# Patient Record
Sex: Female | Born: 1955 | Race: Black or African American | Hispanic: No | Smoking: Never smoker
Health system: Southern US, Community
[De-identification: ages and names within clinical notes are randomized; demographics above are authoritative.]

## PROBLEM LIST (undated history)

## (undated) DIAGNOSIS — I2510 Atherosclerotic heart disease of native coronary artery without angina pectoris: Secondary | ICD-10-CM

## (undated) DIAGNOSIS — E785 Hyperlipidemia, unspecified: Secondary | ICD-10-CM

## (undated) DIAGNOSIS — I10 Essential (primary) hypertension: Secondary | ICD-10-CM

## (undated) DIAGNOSIS — K649 Unspecified hemorrhoids: Secondary | ICD-10-CM

## (undated) DIAGNOSIS — E119 Type 2 diabetes mellitus without complications: Secondary | ICD-10-CM

## (undated) DIAGNOSIS — R600 Localized edema: Secondary | ICD-10-CM

## (undated) DIAGNOSIS — Z531 Procedure and treatment not carried out because of patient's decision for reasons of belief and group pressure: Secondary | ICD-10-CM

## (undated) DIAGNOSIS — K219 Gastro-esophageal reflux disease without esophagitis: Secondary | ICD-10-CM

## (undated) DIAGNOSIS — K589 Irritable bowel syndrome without diarrhea: Secondary | ICD-10-CM

## (undated) DIAGNOSIS — IMO0001 Reserved for inherently not codable concepts without codable children: Secondary | ICD-10-CM

## (undated) DIAGNOSIS — E669 Obesity, unspecified: Secondary | ICD-10-CM

## (undated) HISTORY — DX: Hyperlipidemia, unspecified: E78.5

## (undated) HISTORY — DX: Unspecified hemorrhoids: K64.9

## (undated) HISTORY — PX: ABDOMINAL HYSTERECTOMY: SHX81

## (undated) HISTORY — DX: Obesity, unspecified: E66.9

## (undated) HISTORY — PX: CARPAL TUNNEL RELEASE: SHX101

## (undated) HISTORY — DX: Essential (primary) hypertension: I10

## (undated) HISTORY — DX: Localized edema: R60.0

---

## 1998-02-24 ENCOUNTER — Encounter: Admission: RE | Admit: 1998-02-24 | Discharge: 1998-02-24 | Payer: Self-pay | Admitting: Family Medicine

## 1998-03-31 ENCOUNTER — Encounter: Admission: RE | Admit: 1998-03-31 | Discharge: 1998-03-31 | Payer: Self-pay | Admitting: Family Medicine

## 1998-11-21 ENCOUNTER — Encounter: Admission: RE | Admit: 1998-11-21 | Discharge: 1998-11-21 | Payer: Self-pay | Admitting: Family Medicine

## 1998-12-26 ENCOUNTER — Other Ambulatory Visit: Admission: RE | Admit: 1998-12-26 | Discharge: 1998-12-26 | Payer: Self-pay | Admitting: *Deleted

## 1998-12-26 ENCOUNTER — Encounter: Admission: RE | Admit: 1998-12-26 | Discharge: 1998-12-26 | Payer: Self-pay | Admitting: Family Medicine

## 1999-06-29 ENCOUNTER — Encounter: Admission: RE | Admit: 1999-06-29 | Discharge: 1999-06-29 | Payer: Self-pay | Admitting: Family Medicine

## 1999-08-01 ENCOUNTER — Encounter: Admission: RE | Admit: 1999-08-01 | Discharge: 1999-08-01 | Payer: Self-pay | Admitting: Family Medicine

## 1999-10-03 ENCOUNTER — Encounter: Admission: RE | Admit: 1999-10-03 | Discharge: 1999-10-03 | Payer: Self-pay | Admitting: Family Medicine

## 1999-10-24 ENCOUNTER — Encounter: Admission: RE | Admit: 1999-10-24 | Discharge: 1999-10-24 | Payer: Self-pay | Admitting: Family Medicine

## 1999-12-06 ENCOUNTER — Encounter: Admission: RE | Admit: 1999-12-06 | Discharge: 1999-12-06 | Payer: Self-pay | Admitting: Family Medicine

## 1999-12-20 ENCOUNTER — Encounter: Admission: RE | Admit: 1999-12-20 | Discharge: 1999-12-20 | Payer: Self-pay | Admitting: Family Medicine

## 2000-01-01 ENCOUNTER — Encounter: Admission: RE | Admit: 2000-01-01 | Discharge: 2000-01-01 | Payer: Self-pay | Admitting: Sports Medicine

## 2000-01-03 ENCOUNTER — Encounter: Admission: RE | Admit: 2000-01-03 | Discharge: 2000-01-03 | Payer: Self-pay | Admitting: Family Medicine

## 2000-01-08 ENCOUNTER — Encounter: Admission: RE | Admit: 2000-01-08 | Discharge: 2000-01-08 | Payer: Self-pay | Admitting: Family Medicine

## 2000-01-30 ENCOUNTER — Encounter: Admission: RE | Admit: 2000-01-30 | Discharge: 2000-01-30 | Payer: Self-pay | Admitting: Family Medicine

## 2000-02-20 ENCOUNTER — Encounter: Admission: RE | Admit: 2000-02-20 | Discharge: 2000-02-20 | Payer: Self-pay | Admitting: Family Medicine

## 2000-02-27 ENCOUNTER — Encounter: Admission: RE | Admit: 2000-02-27 | Discharge: 2000-02-27 | Payer: Self-pay | Admitting: Family Medicine

## 2000-03-19 ENCOUNTER — Encounter: Admission: RE | Admit: 2000-03-19 | Discharge: 2000-06-17 | Payer: Self-pay | Admitting: *Deleted

## 2000-07-03 ENCOUNTER — Encounter: Admission: RE | Admit: 2000-07-03 | Discharge: 2000-07-03 | Payer: Self-pay | Admitting: *Deleted

## 2000-08-11 ENCOUNTER — Encounter: Admission: RE | Admit: 2000-08-11 | Discharge: 2000-08-11 | Payer: Self-pay | Admitting: Family Medicine

## 2000-08-20 ENCOUNTER — Encounter: Admission: RE | Admit: 2000-08-20 | Discharge: 2000-08-20 | Payer: Self-pay | Admitting: Family Medicine

## 2000-09-01 ENCOUNTER — Encounter: Admission: RE | Admit: 2000-09-01 | Discharge: 2000-09-01 | Payer: Self-pay | Admitting: Family Medicine

## 2000-09-17 ENCOUNTER — Encounter: Admission: RE | Admit: 2000-09-17 | Discharge: 2000-09-17 | Payer: Self-pay | Admitting: Family Medicine

## 2000-11-13 ENCOUNTER — Encounter: Admission: RE | Admit: 2000-11-13 | Discharge: 2000-11-13 | Payer: Self-pay | Admitting: Family Medicine

## 2000-11-17 ENCOUNTER — Encounter: Admission: RE | Admit: 2000-11-17 | Discharge: 2000-11-17 | Payer: Self-pay | Admitting: Family Medicine

## 2000-11-24 ENCOUNTER — Encounter: Admission: RE | Admit: 2000-11-24 | Discharge: 2001-02-22 | Payer: Self-pay | Admitting: *Deleted

## 2000-11-28 ENCOUNTER — Encounter: Admission: RE | Admit: 2000-11-28 | Discharge: 2000-11-28 | Payer: Self-pay | Admitting: Family Medicine

## 2001-01-27 ENCOUNTER — Encounter: Admission: RE | Admit: 2001-01-27 | Discharge: 2001-01-27 | Payer: Self-pay | Admitting: Sports Medicine

## 2001-02-03 ENCOUNTER — Encounter: Admission: RE | Admit: 2001-02-03 | Discharge: 2001-02-03 | Payer: Self-pay | Admitting: Family Medicine

## 2001-02-09 ENCOUNTER — Encounter: Admission: RE | Admit: 2001-02-09 | Discharge: 2001-02-09 | Payer: Self-pay | Admitting: Family Medicine

## 2001-07-06 ENCOUNTER — Encounter: Admission: RE | Admit: 2001-07-06 | Discharge: 2001-07-06 | Payer: Self-pay | Admitting: Sports Medicine

## 2001-07-06 ENCOUNTER — Encounter: Payer: Self-pay | Admitting: Sports Medicine

## 2001-08-07 ENCOUNTER — Encounter: Admission: RE | Admit: 2001-08-07 | Discharge: 2001-08-07 | Payer: Self-pay | Admitting: Family Medicine

## 2001-11-06 ENCOUNTER — Encounter: Admission: RE | Admit: 2001-11-06 | Discharge: 2001-11-06 | Payer: Self-pay | Admitting: Family Medicine

## 2001-12-28 ENCOUNTER — Encounter: Admission: RE | Admit: 2001-12-28 | Discharge: 2001-12-28 | Payer: Self-pay | Admitting: Family Medicine

## 2002-04-29 ENCOUNTER — Encounter: Admission: RE | Admit: 2002-04-29 | Discharge: 2002-04-29 | Payer: Self-pay | Admitting: Family Medicine

## 2002-06-21 ENCOUNTER — Encounter: Admission: RE | Admit: 2002-06-21 | Discharge: 2002-06-21 | Payer: Self-pay | Admitting: Family Medicine

## 2002-07-30 ENCOUNTER — Encounter: Payer: Self-pay | Admitting: Sports Medicine

## 2002-07-30 ENCOUNTER — Encounter: Admission: RE | Admit: 2002-07-30 | Discharge: 2002-07-30 | Payer: Self-pay | Admitting: Sports Medicine

## 2002-08-05 ENCOUNTER — Encounter: Admission: RE | Admit: 2002-08-05 | Discharge: 2002-08-05 | Payer: Self-pay | Admitting: Family Medicine

## 2002-09-21 ENCOUNTER — Encounter: Admission: RE | Admit: 2002-09-21 | Discharge: 2002-09-21 | Payer: Self-pay | Admitting: Sports Medicine

## 2003-02-28 ENCOUNTER — Encounter: Admission: RE | Admit: 2003-02-28 | Discharge: 2003-02-28 | Payer: Self-pay | Admitting: Family Medicine

## 2003-03-07 ENCOUNTER — Encounter: Admission: RE | Admit: 2003-03-07 | Discharge: 2003-03-07 | Payer: Self-pay | Admitting: Sports Medicine

## 2003-03-21 ENCOUNTER — Encounter: Admission: RE | Admit: 2003-03-21 | Discharge: 2003-03-21 | Payer: Self-pay | Admitting: Family Medicine

## 2003-03-25 ENCOUNTER — Encounter: Admission: RE | Admit: 2003-03-25 | Discharge: 2003-03-25 | Payer: Self-pay | Admitting: Family Medicine

## 2003-09-29 ENCOUNTER — Encounter: Admission: RE | Admit: 2003-09-29 | Discharge: 2003-09-29 | Payer: Self-pay | Admitting: Sports Medicine

## 2003-12-30 ENCOUNTER — Encounter (INDEPENDENT_AMBULATORY_CARE_PROVIDER_SITE_OTHER): Payer: Self-pay | Admitting: *Deleted

## 2003-12-30 LAB — CONVERTED CEMR LAB

## 2004-01-17 ENCOUNTER — Encounter: Admission: RE | Admit: 2004-01-17 | Discharge: 2004-01-17 | Payer: Self-pay | Admitting: Family Medicine

## 2004-06-22 ENCOUNTER — Ambulatory Visit: Payer: Self-pay | Admitting: Family Medicine

## 2004-07-04 ENCOUNTER — Ambulatory Visit: Payer: Self-pay | Admitting: Family Medicine

## 2004-07-12 ENCOUNTER — Ambulatory Visit: Payer: Self-pay | Admitting: Family Medicine

## 2004-10-17 ENCOUNTER — Ambulatory Visit: Payer: Self-pay | Admitting: Family Medicine

## 2004-10-19 ENCOUNTER — Encounter: Admission: RE | Admit: 2004-10-19 | Discharge: 2004-10-19 | Payer: Self-pay | Admitting: Sports Medicine

## 2004-10-23 ENCOUNTER — Other Ambulatory Visit: Admission: RE | Admit: 2004-10-23 | Discharge: 2004-10-23 | Payer: Self-pay | Admitting: Obstetrics and Gynecology

## 2004-12-27 ENCOUNTER — Ambulatory Visit: Payer: Self-pay | Admitting: Family Medicine

## 2005-01-17 ENCOUNTER — Ambulatory Visit: Payer: Self-pay | Admitting: Family Medicine

## 2005-03-08 ENCOUNTER — Ambulatory Visit (HOSPITAL_COMMUNITY): Admission: RE | Admit: 2005-03-08 | Discharge: 2005-03-08 | Payer: Self-pay | Admitting: Obstetrics and Gynecology

## 2005-10-23 ENCOUNTER — Encounter: Admission: RE | Admit: 2005-10-23 | Discharge: 2005-10-23 | Payer: Self-pay | Admitting: Sports Medicine

## 2006-03-14 ENCOUNTER — Ambulatory Visit: Payer: Self-pay | Admitting: Family Medicine

## 2006-03-19 ENCOUNTER — Ambulatory Visit: Payer: Self-pay | Admitting: Family Medicine

## 2006-05-29 ENCOUNTER — Other Ambulatory Visit: Admission: RE | Admit: 2006-05-29 | Discharge: 2006-05-29 | Payer: Self-pay | Admitting: Obstetrics and Gynecology

## 2006-07-14 ENCOUNTER — Encounter: Admission: RE | Admit: 2006-07-14 | Discharge: 2006-07-14 | Payer: Self-pay | Admitting: Emergency Medicine

## 2006-07-24 ENCOUNTER — Encounter: Admission: RE | Admit: 2006-07-24 | Discharge: 2006-07-24 | Payer: Self-pay | Admitting: Emergency Medicine

## 2006-09-04 ENCOUNTER — Encounter: Admission: RE | Admit: 2006-09-04 | Discharge: 2006-09-04 | Payer: Self-pay | Admitting: Emergency Medicine

## 2006-09-30 HISTORY — PX: BREAST EXCISIONAL BIOPSY: SUR124

## 2006-11-28 ENCOUNTER — Encounter (INDEPENDENT_AMBULATORY_CARE_PROVIDER_SITE_OTHER): Payer: Self-pay | Admitting: *Deleted

## 2007-01-27 ENCOUNTER — Encounter: Payer: Self-pay | Admitting: *Deleted

## 2007-01-27 ENCOUNTER — Ambulatory Visit: Payer: Self-pay | Admitting: Family Medicine

## 2007-01-27 ENCOUNTER — Encounter: Payer: Self-pay | Admitting: Family Medicine

## 2007-01-27 DIAGNOSIS — E7800 Pure hypercholesterolemia, unspecified: Secondary | ICD-10-CM | POA: Insufficient documentation

## 2007-01-27 DIAGNOSIS — R609 Edema, unspecified: Secondary | ICD-10-CM | POA: Insufficient documentation

## 2007-01-27 DIAGNOSIS — E119 Type 2 diabetes mellitus without complications: Secondary | ICD-10-CM

## 2007-01-27 DIAGNOSIS — I10 Essential (primary) hypertension: Secondary | ICD-10-CM | POA: Insufficient documentation

## 2007-01-27 LAB — CONVERTED CEMR LAB: Hgb A1c MFr Bld: 5.6 %

## 2007-01-30 ENCOUNTER — Encounter (INDEPENDENT_AMBULATORY_CARE_PROVIDER_SITE_OTHER): Payer: Self-pay | Admitting: *Deleted

## 2007-02-04 ENCOUNTER — Ambulatory Visit: Payer: Self-pay | Admitting: Family Medicine

## 2007-02-04 ENCOUNTER — Ambulatory Visit (HOSPITAL_COMMUNITY): Admission: RE | Admit: 2007-02-04 | Discharge: 2007-02-04 | Payer: Self-pay | Admitting: Family Medicine

## 2007-02-04 ENCOUNTER — Ambulatory Visit: Payer: Self-pay | Admitting: Cardiology

## 2007-02-04 ENCOUNTER — Encounter: Payer: Self-pay | Admitting: Cardiology

## 2007-02-04 ENCOUNTER — Encounter (INDEPENDENT_AMBULATORY_CARE_PROVIDER_SITE_OTHER): Payer: Self-pay | Admitting: *Deleted

## 2007-02-05 ENCOUNTER — Encounter: Payer: Self-pay | Admitting: Family Medicine

## 2007-02-05 LAB — CONVERTED CEMR LAB
Calcium: 9.2 mg/dL (ref 8.4–10.5)
Chloride: 105 meq/L (ref 96–112)
Creatinine, Ser: 0.93 mg/dL (ref 0.40–1.20)
HDL: 58 mg/dL (ref >39)
LDL Cholesterol: 170 mg/dL — ABNORMAL HIGH (ref 0–99)
Sodium: 140 meq/L (ref 135–145)
Total CHOL/HDL Ratio: 4.3
Triglycerides: 96 mg/dL (ref <150)

## 2007-02-13 ENCOUNTER — Telehealth: Payer: Self-pay | Admitting: Family Medicine

## 2007-03-12 ENCOUNTER — Telehealth: Payer: Self-pay | Admitting: Family Medicine

## 2007-03-12 ENCOUNTER — Encounter: Payer: Self-pay | Admitting: Family Medicine

## 2007-03-16 ENCOUNTER — Telehealth (INDEPENDENT_AMBULATORY_CARE_PROVIDER_SITE_OTHER): Payer: Self-pay | Admitting: *Deleted

## 2007-07-16 ENCOUNTER — Encounter: Admission: RE | Admit: 2007-07-16 | Discharge: 2007-07-16 | Payer: Self-pay | Admitting: Emergency Medicine

## 2007-08-19 ENCOUNTER — Encounter: Admission: RE | Admit: 2007-08-19 | Discharge: 2007-08-19 | Payer: Self-pay | Admitting: General Surgery

## 2007-08-20 ENCOUNTER — Encounter: Admission: RE | Admit: 2007-08-20 | Discharge: 2007-08-20 | Payer: Self-pay | Admitting: General Surgery

## 2007-08-20 ENCOUNTER — Ambulatory Visit (HOSPITAL_BASED_OUTPATIENT_CLINIC_OR_DEPARTMENT_OTHER): Admission: RE | Admit: 2007-08-20 | Discharge: 2007-08-20 | Payer: Self-pay | Admitting: General Surgery

## 2007-08-20 ENCOUNTER — Encounter (INDEPENDENT_AMBULATORY_CARE_PROVIDER_SITE_OTHER): Payer: Self-pay | Admitting: General Surgery

## 2007-09-30 ENCOUNTER — Encounter: Payer: Self-pay | Admitting: Family Medicine

## 2007-11-06 ENCOUNTER — Encounter: Payer: Self-pay | Admitting: Family Medicine

## 2008-01-25 ENCOUNTER — Encounter: Payer: Self-pay | Admitting: Family Medicine

## 2008-01-26 ENCOUNTER — Encounter: Payer: Self-pay | Admitting: *Deleted

## 2008-03-15 HISTORY — PX: NM MYOVIEW LTD: HXRAD82

## 2008-04-28 ENCOUNTER — Ambulatory Visit: Payer: Self-pay | Admitting: Family Medicine

## 2008-04-28 ENCOUNTER — Encounter: Payer: Self-pay | Admitting: Family Medicine

## 2008-04-28 DIAGNOSIS — M25569 Pain in unspecified knee: Secondary | ICD-10-CM

## 2008-04-28 LAB — CONVERTED CEMR LAB
Albumin: 4.2 g/dL (ref 3.5–5.2)
BUN: 9 mg/dL (ref 6–23)
CO2: 21 meq/L (ref 19–32)
Cholesterol: 209 mg/dL — ABNORMAL HIGH (ref 0–200)
Glucose, Bld: 146 mg/dL — ABNORMAL HIGH (ref 70–99)
Potassium: 3.6 meq/L (ref 3.5–5.3)
Sodium: 139 meq/L (ref 135–145)
Total Protein: 7.2 g/dL (ref 6.0–8.3)
Triglycerides: 71 mg/dL (ref <150)

## 2008-04-29 ENCOUNTER — Encounter: Payer: Self-pay | Admitting: Family Medicine

## 2008-05-12 ENCOUNTER — Ambulatory Visit: Payer: Self-pay | Admitting: Family Medicine

## 2008-07-18 ENCOUNTER — Encounter: Admission: RE | Admit: 2008-07-18 | Discharge: 2008-07-18 | Payer: Self-pay | Admitting: Emergency Medicine

## 2008-09-29 ENCOUNTER — Encounter: Admission: RE | Admit: 2008-09-29 | Discharge: 2008-09-29 | Payer: Self-pay | Admitting: Family Medicine

## 2008-12-26 ENCOUNTER — Telehealth (INDEPENDENT_AMBULATORY_CARE_PROVIDER_SITE_OTHER): Payer: Self-pay | Admitting: *Deleted

## 2008-12-30 ENCOUNTER — Encounter: Payer: Self-pay | Admitting: Family Medicine

## 2008-12-30 ENCOUNTER — Ambulatory Visit: Payer: Self-pay | Admitting: Family Medicine

## 2008-12-30 DIAGNOSIS — G5600 Carpal tunnel syndrome, unspecified upper limb: Secondary | ICD-10-CM

## 2008-12-30 LAB — CONVERTED CEMR LAB
ALT: 16 units/L (ref 0–35)
Albumin: 4.1 g/dL (ref 3.5–5.2)
CO2: 26 meq/L (ref 19–32)
Calcium: 9.4 mg/dL (ref 8.4–10.5)
Chloride: 108 meq/L (ref 96–112)
Potassium: 4.3 meq/L (ref 3.5–5.3)
Sodium: 142 meq/L (ref 135–145)
TSH: 1.088 microintl units/mL (ref 0.350–4.500)
Total Bilirubin: 0.4 mg/dL (ref 0.3–1.2)
Total Protein: 7.4 g/dL (ref 6.0–8.3)

## 2009-01-02 ENCOUNTER — Encounter: Payer: Self-pay | Admitting: Family Medicine

## 2009-01-24 ENCOUNTER — Encounter: Payer: Self-pay | Admitting: Family Medicine

## 2009-01-30 ENCOUNTER — Encounter (INDEPENDENT_AMBULATORY_CARE_PROVIDER_SITE_OTHER): Payer: Self-pay | Admitting: *Deleted

## 2009-02-10 ENCOUNTER — Ambulatory Visit: Payer: Self-pay | Admitting: Family Medicine

## 2009-02-14 ENCOUNTER — Telehealth: Payer: Self-pay | Admitting: *Deleted

## 2009-02-16 ENCOUNTER — Encounter: Payer: Self-pay | Admitting: Family Medicine

## 2009-02-16 ENCOUNTER — Encounter: Admission: RE | Admit: 2009-02-16 | Discharge: 2009-02-16 | Payer: Self-pay | Admitting: Family Medicine

## 2009-02-21 ENCOUNTER — Encounter: Payer: Self-pay | Admitting: Family Medicine

## 2009-02-21 ENCOUNTER — Ambulatory Visit: Payer: Self-pay | Admitting: Family Medicine

## 2009-02-21 LAB — CONVERTED CEMR LAB
MCHC: 29.8 g/dL — ABNORMAL LOW (ref 30.0–36.0)
MCV: 78.6 fL (ref 78.0–100.0)
RBC: 4.35 M/uL (ref 3.87–5.11)
Retic Ct Pct: 1.1 % (ref 0.4–3.1)

## 2009-02-22 ENCOUNTER — Telehealth: Payer: Self-pay | Admitting: Family Medicine

## 2009-03-10 ENCOUNTER — Encounter: Payer: Self-pay | Admitting: Family Medicine

## 2009-03-13 ENCOUNTER — Encounter: Payer: Self-pay | Admitting: Family Medicine

## 2009-03-13 ENCOUNTER — Telehealth: Payer: Self-pay | Admitting: Family Medicine

## 2009-03-23 ENCOUNTER — Encounter: Payer: Self-pay | Admitting: Family Medicine

## 2009-04-21 ENCOUNTER — Encounter: Payer: Self-pay | Admitting: Family Medicine

## 2009-05-11 ENCOUNTER — Encounter: Payer: Self-pay | Admitting: Family Medicine

## 2009-05-21 DIAGNOSIS — K210 Gastro-esophageal reflux disease with esophagitis: Secondary | ICD-10-CM

## 2009-05-31 ENCOUNTER — Encounter: Payer: Self-pay | Admitting: Family Medicine

## 2009-08-03 ENCOUNTER — Ambulatory Visit: Payer: Self-pay | Admitting: Family Medicine

## 2009-08-03 ENCOUNTER — Encounter: Payer: Self-pay | Admitting: Family Medicine

## 2009-08-03 LAB — CONVERTED CEMR LAB
BUN: 10 mg/dL (ref 6–23)
CO2: 24 meq/L (ref 19–32)
Chloride: 104 meq/L (ref 96–112)
Creatinine, Ser: 0.85 mg/dL (ref 0.40–1.20)
HCT: 38.2 % (ref 36.0–46.0)
Hemoglobin: 12 g/dL (ref 12.0–15.0)
Hgb A1c MFr Bld: 6.1 %
RDW: 14.4 % (ref 11.5–15.5)

## 2009-08-06 ENCOUNTER — Encounter: Payer: Self-pay | Admitting: Family Medicine

## 2009-08-28 ENCOUNTER — Encounter: Admission: RE | Admit: 2009-08-28 | Discharge: 2009-08-28 | Payer: Self-pay | Admitting: Family Medicine

## 2009-09-06 ENCOUNTER — Ambulatory Visit: Payer: Self-pay | Admitting: Family Medicine

## 2009-10-24 ENCOUNTER — Ambulatory Visit: Payer: Self-pay | Admitting: Family Medicine

## 2009-11-21 ENCOUNTER — Encounter: Payer: Self-pay | Admitting: Family Medicine

## 2009-11-21 ENCOUNTER — Ambulatory Visit: Payer: Self-pay | Admitting: Family Medicine

## 2009-11-22 ENCOUNTER — Telehealth: Payer: Self-pay | Admitting: Family Medicine

## 2009-11-22 ENCOUNTER — Encounter: Payer: Self-pay | Admitting: Family Medicine

## 2009-11-26 LAB — CONVERTED CEMR LAB
Alkaline Phosphatase: 70 units/L (ref 39–117)
BUN: 14 mg/dL (ref 6–23)
Glucose, Bld: 101 mg/dL — ABNORMAL HIGH (ref 70–99)
HDL: 59 mg/dL (ref >39)
LDL Cholesterol: 154 mg/dL — ABNORMAL HIGH (ref 0–99)
Sodium: 141 meq/L (ref 135–145)
Total Bilirubin: 0.5 mg/dL (ref 0.3–1.2)
Total CHOL/HDL Ratio: 3.8
Triglycerides: 69 mg/dL (ref <150)
VLDL: 14 mg/dL (ref 0–40)

## 2010-07-19 ENCOUNTER — Encounter: Payer: Self-pay | Admitting: Family Medicine

## 2010-08-29 ENCOUNTER — Encounter: Admission: RE | Admit: 2010-08-29 | Discharge: 2010-08-29 | Payer: Self-pay | Admitting: Family Medicine

## 2010-09-28 HISTORY — PX: US ECHOCARDIOGRAPHY: HXRAD669

## 2010-10-25 ENCOUNTER — Encounter: Payer: Self-pay | Admitting: Family Medicine

## 2010-10-30 NOTE — Assessment & Plan Note (Signed)
Summary: fu HTN/kh   Vital Signs:  Patient profile:   55 year old female Height:      65 inches Weight:      176.1 pounds BMI:     29.41 Temp:     98.1 degrees F oral Pulse rate:   93 / minute BP sitting:   159 / 96  (left arm) Cuff size:   regular  Vitals Entered By: Garen Grams LPN (October 24, 2009 3:12 PM) CC: f/u BP Is Patient Diabetic? Yes Did you bring your meter with you today? No Pain Assessment Patient in pain? no        Primary Care Provider:  Delbert Harness MD  CC:  f/u BP.  History of Present Illness: 55 yo here for follow-up of BP today  Last seen about 1 month ago at which time blood pressure was above goal.  Since that time patient has been working on improved complicance with medications.  Patient shows improvement today, brining in calendar I printed her filled out with home BP checks as well as check marks on days which she took her medication.  Her home Bp readings ranged from 115-151/ 79-96.  She has been taking her medications 2-4 times a week which is an improvement from rarely before.  Denies CP, SOB, has continued L>R LE edema  BP elevated today.  Has not taken BP meds for severaal days.  States its just a matter of remembering and finances or side effects not an issue.  Has not been exercising due to weather, no dietary changes but plans on soon.  Habits & Providers  Alcohol-Tobacco-Diet     Tobacco Status: never  Current Medications (verified): 1)  Aspirin Ec 81 Mg Tbec (Aspirin) .... Take 1 Tablet By Mouth Once A Day 2)  Hydrochlorothiazide 12.5 Mg Caps (Hydrochlorothiazide) .... Take 1 Capsule By Mouth Once A Day 3)  Lasix 20 Mg Tabs (Furosemide) .... Take 1 Tablet By Mouth Once A Day As Needed For Leg Swelling 4)  Lisinopril 40 Mg Tabs (Lisinopril) .... Take 1 Tablet By Mouth Once A Day 5)  Norvasc 5 Mg Tabs (Amlodipine Besylate) .... Take 1 Tablet Once A Day.  Make Appointment For Further Refills 6)  Fish Oil 1000 Mg Caps (Omega-3 Fatty  Acids) .... 3 Caps By Mouth Daily  Allergies: 1)  ! Lipitor 2)  ! * Zetia PMH-FH-SH reviewed-no changes except otherwise noted  Review of Systems      See HPI General:  Denies fever and weight loss. CV:  Complains of swelling of feet; denies chest pain or discomfort, fatigue, lightheadness, and shortness of breath with exertion. Resp:  Denies cough and shortness of breath.  Physical Exam  General:  A well nourished, well appearing aaf in NAD.  Cheerful. Lungs:  CTAB, no accessory muscle use, no retractions Heart:  RRR, no murmur, rub or gallop Extremities:  LE edema Left > Right, unchanged.   Impression & Recommendations:  Problem # 1:  HYPERTENSION (ICD-401.9)  BP today slightly improved from previous but still above goal.  Will continue to work on taking medications daily and therapeutic lifestyle changes.  Discussed with patient in depth and given handout on tips for remember HTN meds, printed out calendar for tracking, given handout on nutrition tips.  Will follow-up in 1 month.  Her updated medication list for this problem includes:    Hydrochlorothiazide 12.5 Mg Caps (Hydrochlorothiazide) .Marland Kitchen... Take 1 capsule by mouth once a day    Lasix 20 Mg  Tabs (Furosemide) .Marland Kitchen... Take 1 tablet by mouth once a day as needed for leg swelling    Lisinopril 40 Mg Tabs (Lisinopril) .Marland Kitchen... Take 1 tablet by mouth once a day    Norvasc 5 Mg Tabs (Amlodipine besylate) .Marland Kitchen... Take 1 tablet once a day.  make appointment for further refills  BP today: 159/96 Prior BP: 162/99 (09/06/2009)  Labs Reviewed: K+: 3.9 (08/03/2009) Creat: : 0.85 (08/03/2009)   Chol: 209 (04/28/2008)   HDL: 50 (04/28/2008)   LDL: 145 (04/28/2008)   TG: 71 (04/28/2008)  Orders: [facility]- Est Level  3 (99213)  Complete Medication List: 1)  Aspirin Ec 81 Mg Tbec (Aspirin) .... Take 1 tablet by mouth once a day 2)  Hydrochlorothiazide 12.5 Mg Caps (Hydrochlorothiazide) .... Take 1 capsule by mouth once a day 3)  Lasix 20 Mg  Tabs (Furosemide) .... Take 1 tablet by mouth once a day as needed for leg swelling 4)  Lisinopril 40 Mg Tabs (Lisinopril) .... Take 1 tablet by mouth once a day 5)  Norvasc 5 Mg Tabs (Amlodipine besylate) .... Take 1 tablet once a day.  make appointment for further refills 6)  Fish Oil 1000 Mg Caps (Omega-3 fatty acids) .... 3 caps by mouth daily  Patient Instructions: 1)  Work on continuuing to cut back on sweets and juices. 2)  Keep tracking your blood pressure and work on taking your medicines regularly. 3)  Follow-up in 1 month for a morning appointment for fasting labs.  Last Flu Vaccine:  given (07/31/2008 2:07:48 PM) Flu Vaccine Result Date:  09/16/2009 Flu Vaccine Result:  given Flu Vaccine Next Due:  1 yr Colonoscopy Result Date:  11/09/2007 Colonoscopy Result:  normal Colonoscopy Next Due:  10 yr Last Mammogram:  ASSESSMENT: Negative - BI-RADS 1^MM DIGITAL SCREENING (08/28/2009 4:50:00 PM) Mammogram Next Due:  1 yr

## 2010-10-30 NOTE — Consult Note (Signed)
Summary: [facility]  [facility]   Imported By: De Nurse 07/26/2010 12:14:43  _____________________________________________________________________  External Attachment:    Type:   Image     Comment:   External Document

## 2010-10-30 NOTE — Progress Notes (Signed)
Summary: Letter Needed  Phone Note Call from Patient Call back at Home Phone [phone]   Caller: Patient Summary of Call: Pt needs note stating that she has high bp and that becuase of the medication she takes causes her to make frequent trips to the bathroom. Also they need to know if she has anything that would affect her in doing her job duties.   Her employer wants this note for her file.   Initial call taken by: Clydell Hakim,  November 22, 2009 4:12 PM  Follow-up for Phone Call        to PCP Follow-up by: Gladstone Pih,  November 22, 2009 4:33 PM  Additional Follow-up for Phone Call Additional follow up Details #1::        i have saved the letter to the chart.  Please give to patient or workplace.  Thanks. Additional Follow-up by: Delbert Harness MD,  November 22, 2009 4:50 PM    Additional Follow-up for Phone Call Additional follow up Details #2::    Pt will pick up letter at lunch time tomorrow, put up front Follow-up by: Gladstone Pih,  November 22, 2009 5:02 PM

## 2010-10-30 NOTE — Letter (Signed)
Summary: Generic Letter  [facility]  [address]   [place], [place] [address]   Phone: [phone]  Fax: [phone]    11/22/2009  To whom it may concern,  My patient Stacy Horton is currently on medication that may require her to take breaks to use the restroom frequently.  Otherwise she has no other limitations at this time.  Please let me know if I can be of further assistance.   Sincerely,   Delbert Harness MD

## 2010-10-30 NOTE — Assessment & Plan Note (Signed)
Summary: FU AND LAB/KH   Vital Signs:  Patient profile:   55 year old female Weight:      177.2 pounds Temp:     98.5 degrees F oral Pulse rate:   80 / minute BP sitting:   148 / 91  (left arm) Cuff size:   large  Vitals Entered By: Loralee Pacas CMA (November 21, 2009 8:50 AM)  Primary Care Provider:  Delbert Harness MD   History of Present Illness: 55 yo here for follow-up  HYPERTENSION Meds: Taking and tolerating? HCTZ, Norvasc, Lisinopril.  Lasix for edema occaisionally Home BP's: yes, brings in logs with BP 115-151/72-92.  Taking blood meds 1-4 times weekly. Chest Pain: no Dyspnea: no  DIABETES Meds: None- Dx 2001-2.  Never taken Meds. Taking and tolerating? None Blood sugars: Not willing to take. Hypoglycemic symptoms: no Visual problems: no Monitoring feet: yes Numbness/Tingling: no Last eye exam: Nov/Dec 2010 Last A1c: 6.1 Flu/PNA vaccines? pt states yes to both.  HYPERLIPIDEMIA Meds: Fish Oil. Taking and tolerating? yes Diet pattern: none Exercise:none  GERD:  has been having more epigatsric pain in the morning, 2 aleve every other day for carpal tunnel- Not wanting surgery yet but has had consultation.   Has restarted PPI with some improvement.  Current Medications (verified): 1)  Aspirin Ec 81 Mg Tbec (Aspirin) .... Take 1 Tablet By Mouth Once A Day 2)  Hydrochlorothiazide 12.5 Mg Caps (Hydrochlorothiazide) .... Take 1 Capsule By Mouth Once A Day 3)  Lasix 20 Mg Tabs (Furosemide) .... Take 1 Tablet By Mouth Once A Day As Needed For Leg Swelling 4)  Lisinopril 40 Mg Tabs (Lisinopril) .... Take 1 Tablet By Mouth Once A Day 5)  Norvasc 5 Mg Tabs (Amlodipine Besylate) .... Take 1 Tablet Once A Day.  Make Appointment For Further Refills 6)  Fish Oil 1000 Mg Caps (Omega-3 Fatty Acids) .... 3 Caps By Mouth Daily  Allergies: 1)  ! Lipitor 2)  ! * Zetia PMH-FH-SH reviewed-no changes except otherwise noted  Review of Systems      See HPI General:  Denies  fever and weight loss. Eyes:  Denies blurring. CV:  Complains of swelling of feet; denies chest pain or discomfort, difficulty breathing at night, fatigue, lightheadness, and shortness of breath with exertion. Resp:  Denies cough and shortness of breath. GI:  Complains of hemorrhoids; denies abdominal pain.  Physical Exam  General:  A well nourished, well appearing aaf in NAD.  Cheerful. Lungs:  CTAB, no accessory muscle use, no retractions Heart:  RRR, no murmur, rub or gallop Abdomen:  Soft, non tender, no masses or hepato-splenomegaly.  BS normoactive x4 quadrants Extremities:  1+ LE edema Left > Right, unchanged.  See Diabetic foot exam.   Impression & Recommendations:  Problem # 1:  HYPERTENSION (ICD-401.9) Improving complicance with meds and with checking blood pressues with resultant improvement in blood pressure.  Still could use a lot of improvement in remembering and aim is for most days of the week.  Discussed therapeutic lifestyle modification.  Follow-up in 3 months.  Her updated medication list for this problem includes:    Hydrochlorothiazide 12.5 Mg Caps (Hydrochlorothiazide) .Marland Kitchen... Take 1 capsule by mouth once a day    Lasix 20 Mg Tabs (Furosemide) .Marland Kitchen... Take 1 tablet by mouth once a day as needed for leg swelling    Lisinopril 40 Mg Tabs (Lisinopril) .Marland Kitchen... Take 1 tablet by mouth once a day    Norvasc 5 Mg Tabs (Amlodipine besylate) .Marland KitchenMarland KitchenMarland KitchenMarland Kitchen  Take 1 tablet once a day.  make appointment for further refills  Orders: Comp Met-[facility] ([id number]) [facility]- Est  Level 4 (96295)  Problem # 2:  DIABETES MELLITUS, TYPE II (ICD-250.00) Increasign Hgb A1c from 6.1 to 6.6.  Discussed meaning of this with patient and need to start medicines at 7.0.  Patient would like to work on diet and nutrition and plans on resuming diet given by Dr. Gerilyn Pilgrim previously.  Pt declines need for refresher visit at this time.  Her updated medication list for this problem includes:    Aspirin Ec 81 Mg Tbec  (Aspirin) .Marland Kitchen... Take 1 tablet by mouth once a day    Lisinopril 40 Mg Tabs (Lisinopril) .Marland Kitchen... Take 1 tablet by mouth once a day  Orders: Comp Met-[facility] [phone]) Lipid-[facility] ([id number]) A1C-[facility] (03474) [facility]- Est  Level 4 (25956)  Labs Reviewed: Creat: 0.85 (08/03/2009)   Microalbumin: 1+ (12/30/2008) Reviewed HgBA1c results: 6.1 (08/03/2009)  6.7 (12/30/2008)  Problem # 3:  HYPERLIPIDEMIA (ICD-272.4) Will recheck fasting labs today.  Orders: Comp Met-[facility] [phone]) Lipid-[facility] [phone]) [facility]- Est  Level 4 (30160)  Problem # 4:  ANKLE EDEMA (ICD-782.3) Rarely takes lasix.  Not bothersome to patient, no signs of worsening or venous stasis changes.  Her updated medication list for this problem includes:    Hydrochlorothiazide 12.5 Mg Caps (Hydrochlorothiazide) .Marland Kitchen... Take 1 capsule by mouth once a day    Lasix 20 Mg Tabs (Furosemide) .Marland Kitchen... Take 1 tablet by mouth once a day as needed for leg swelling  Orders: Lipid-[facility] ([id number]) [facility]- Est  Level 4 (22025)  Problem # 5:  REFLUX ESOPHAGITIS (ICD-530.11) advised continuing to take PPI and avoid aleve and other nsaids.  Recommended tylenol, cold compresses, brace for carpal tunnel pain releif.  Orders: [facility]- Est  Level 4 (99214)  Complete Medication List: 1)  Aspirin Ec 81 Mg Tbec (Aspirin) .... Take 1 tablet by mouth once a day 2)  Hydrochlorothiazide 12.5 Mg Caps (Hydrochlorothiazide) .... Take 1 capsule by mouth once a day 3)  Lasix 20 Mg Tabs (Furosemide) .... Take 1 tablet by mouth once a day as needed for leg swelling 4)  Lisinopril 40 Mg Tabs (Lisinopril) .... Take 1 tablet by mouth once a day 5)  Norvasc 5 Mg Tabs (Amlodipine besylate) .... Take 1 tablet once a day.  make appointment for further refills 6)  Fish Oil 1000 Mg Caps (Omega-3 fatty acids) .... 3 caps by mouth daily  Patient Instructions: 1)  Your blood pressure is still above your goal of 130/80.  Youre doing better taking your medications!  Keep  working at it and you'll get your blood pressure there! 2)  Work on [employer] and exercise to delay effects of diabetes!  if you would like some nutrition help let me know. 3)  Follow up in 3 months.  Diabetic Eye Exam Date:  08/30/2009 Diabetes Eye Exam Result:  normal Diabetes Eye Exam Due:  1 yr   Laboratory Results   Blood Tests   Date/Time Received: November 21, 2009 8:54 AM  Date/Time Reported: November 21, 2009 9:14 AM   HGBA1C: 6.6%   (Normal Range: Non-Diabetic - 3-6%   Control Diabetic - 6-8%)  Comments: ...............test performed by............Marland KitchenLoralee Pacas, CMA .............entered by...........Marland KitchenBonnie A. [place], MLS (ASCP)cm       Diabetic Foot Exam Foot Inspection Is there a history of a foot ulcer?              No Is there a foot ulcer now?  No Can the patient see the bottom of their feet?          Yes Are the shoes appropriate in style and fit?          No Is there swelling or an abnormal foot shape?          Yes Are the toenails long?                No Are the toenails thick?                No Are the toenails ingrown?              No Is there heavy callous build-up?              No Is there pain in the calf muscle (Intermittent claudication) when walking?    NoIs there a claw toe deformity?              No Is there elevated skin temperature?            No Is there limited ankle dorsiflexion?            No Is there foot or ankle muscle weakness?            No  Diabetic Foot Care Education Patient educated on appropriate care of diabetic feet.   High Risk Feet? No Set Next Diabetic Foot Exam here: 11/20/2010   10-g (5.07) Semmes-Weinstein Monofilament Test           Right Foot          Left Foot Site 1         normal         normal Site 4         normal         normal Site 6         normal         normal Site 9         normal         normal   Prevention & Chronic Care Immunizations   Influenza vaccine: given   (09/16/2009)   Influenza vaccine due: 09/16/2010    Tetanus booster: 09/30/2001: Done.   Tetanus booster due: 10/01/2011    Pneumococcal vaccine: Pneumovax  (12/30/2008)   Pneumococcal vaccine due: None  Colorectal Screening   Hemoccult: Done.  (10/01/1999)   Hemoccult due: Not Indicated    Colonoscopy: normal  (11/09/2007)   Colonoscopy due: 11/08/2017  Other Screening   Pap smear: Done.  (12/30/2003)   Pap smear due: Not Indicated    Mammogram: ASSESSMENT: Negative - BI-RADS 1^MM DIGITAL SCREENING  (08/28/2009)   Mammogram due: 08/28/2010   Smoking status: never  (10/24/2009)  Diabetes Mellitus   HgbA1C: 6.6  (11/21/2009)   Hemoglobin A1C due: 07/29/2008    Eye exam: normal  (08/30/2009)   Eye exam due: 08/30/2010    Foot exam: yes  (01/27/2007)   Foot exam action/deferral: Do today   High risk foot: No  (11/21/2009)   Foot care education: Done  (11/21/2009)   Foot exam due: 11/20/2010    Urine microalbumin/creatinine ratio: Not documented   Urine microalbumin/cr due: 02/04/2008    Diabetes flowsheet reviewed?: Yes   Progress toward A1C goal: At goal  Lipids   Total Cholesterol: 209  (04/28/2008)   LDL: 145  (04/28/2008)   LDL Direct: 129  (12/30/2008)   HDL: 50  (04/28/2008)   Triglycerides: 71  (  04/28/2008)    SGOT (AST): 17  (12/30/2008)   SGPT (ALT): 16  (12/30/2008) CMP ordered    Alkaline phosphatase: 101  (12/30/2008)   Total bilirubin: 0.4  (12/30/2008)    Lipid flowsheet reviewed?: Yes   Progress toward LDL goal: Unchanged  Hypertension   Last Blood Pressure: 148 / 91  (11/21/2009)   Serum creatinine: 0.85  (08/03/2009)   Serum potassium 3.9  (08/03/2009) CMP ordered     Hypertension flowsheet reviewed?: Yes   Progress toward BP goal: Improved  Self-Management Support :   Personal Goals (by the next clinic visit) :     Personal A1C goal: 7  (11/21/2009)     Personal blood pressure goal: 130/80  (09/06/2009)   Patient will work on  the following items until the next clinic visit to reach self-care goals:     Medications and monitoring: take my medicines every day, check my blood pressure  (11/21/2009)     Eating: drink diet soda or water instead of juice or soda, eat more vegetables, use fresh or frozen vegetables, eat foods that are low in salt  (11/21/2009)     Activity: take a 30 minute walk every day  (11/21/2009)    Diabetes self-management support: Written self-care plan, Education handout  (11/21/2009)   Diabetes care plan printed   Diabetes education handout printed    Hypertension self-management support: Written self-care plan  (11/21/2009)   Hypertension self-care plan printed.    Lipid self-management support: Written self-care plan  (11/21/2009)   Lipid self-care plan printed.   Nursing Instructions: Diabetic foot exam today

## 2010-11-09 ENCOUNTER — Encounter: Payer: Self-pay | Admitting: Family Medicine

## 2010-11-09 ENCOUNTER — Ambulatory Visit (INDEPENDENT_AMBULATORY_CARE_PROVIDER_SITE_OTHER): Payer: 59 | Admitting: Family Medicine

## 2010-11-09 VITALS — BP 142/90 | HR 70 | Temp 98.1°F | Ht 64.75 in | Wt 177.5 lb

## 2010-11-09 DIAGNOSIS — K648 Other hemorrhoids: Secondary | ICD-10-CM

## 2010-11-09 DIAGNOSIS — I10 Essential (primary) hypertension: Secondary | ICD-10-CM

## 2010-11-09 DIAGNOSIS — K644 Residual hemorrhoidal skin tags: Secondary | ICD-10-CM

## 2010-11-09 DIAGNOSIS — E119 Type 2 diabetes mellitus without complications: Secondary | ICD-10-CM

## 2010-11-09 DIAGNOSIS — G5600 Carpal tunnel syndrome, unspecified upper limb: Secondary | ICD-10-CM

## 2010-11-09 DIAGNOSIS — E785 Hyperlipidemia, unspecified: Secondary | ICD-10-CM

## 2010-11-09 DIAGNOSIS — R609 Edema, unspecified: Secondary | ICD-10-CM

## 2010-11-09 DIAGNOSIS — K649 Unspecified hemorrhoids: Secondary | ICD-10-CM

## 2010-11-09 MED ORDER — LISINOPRIL-HYDROCHLOROTHIAZIDE 20-12.5 MG PO TABS: 2.0000 | ORAL_TABLET | Freq: Every day | ORAL | Status: DC

## 2010-11-09 MED ORDER — HYDROCORTISONE ACETATE 25 MG RE SUPP: 25.0000 mg | Freq: Every evening | RECTAL | Status: DC | PRN

## 2010-11-09 NOTE — Assessment & Plan Note (Signed)
Discussed fiber supplement, high fiber foods, limiting toilet time/straining.  Handouts given.  Anusol HC refilled, advised not for chronic use.

## 2010-11-09 NOTE — Assessment & Plan Note (Signed)
Was previously on lasix, no longer needed.

## 2010-11-09 NOTE — Assessment & Plan Note (Addendum)
Continue diet controlled.  A1c at goal today.  She plans on scheduling diabetic eye exam.

## 2010-11-09 NOTE — Progress Notes (Signed)
  Subjective:    Patient ID: Stacy Horton, female    DOB: [birth year], 55 y.o.   MRN: [id number]  HPI Hypertension:  Was referred to Dr. Lynnea Ferrier cardiology by GI for hypertension.  Was started on Bystolic and is still on samples of 10 mg daily.  Not checking blood pressures at home.  No chest pain, dyspnea, edema, claudication.  Still has trouble remembering to take blood pressure medications.  Remembers more days than not per patient.  Constipation:  Saw GI who recommended fiber diet.  Patient states she has not complied due to not liking texture of fiber supplement.  Still having intermittent bleeding with hemorrhoids.  Sitting on toilet for long periods of time.  Requests refill of anusol-hc.  Hyperlipidemia:  states cardiology checked her lipids last month and started a statin every other day which she could not tolerate due to previously known intolerance to statins. She states her cardiologist has prescribed her a different statin but she does not remember the name.  Exercising, no dietary discretion. Review of Systems     Objective:   Physical Exam  Constitutional: She appears well-developed and well-nourished.  Neck: Neck supple.  Cardiovascular: Normal rate and regular rhythm.   No murmur heard. Pulmonary/Chest: Effort normal and breath sounds normal. She has no wheezes.  Abdominal: Soft. She exhibits no distension.  Musculoskeletal: She exhibits no edema.  Skin: Skin is warm.          Assessment & Plan:

## 2010-11-09 NOTE — Assessment & Plan Note (Signed)
Intolerant to statins even at QOD dosing.  Is currently being managed by Dr. Lynnea Ferrier.  Will obtain records as patient states she had fasting lipids performed there last month and she has follow-up appt there to discuss further therapy.

## 2010-11-09 NOTE — Assessment & Plan Note (Signed)
Above goal today.  Discussed limiting salt.  Will increase HCTZ from 12.5 to 25 daily.  Will provide this in the form on lisinopril/hctz combo tablet.

## 2010-11-09 NOTE — Patient Instructions (Addendum)
See information on high fiber to help with hemorrhoids Check your blood pressure regularly- your goals is less than 130/80. Follow-up in 3-4 months Work in decreasing salt intake

## 2010-11-15 NOTE — Consult Note (Signed)
Summary: [facility]   Imported By: De Nurse 11/02/2010 12:12:21  _____________________________________________________________________  External Attachment:    Type:   Image     Comment:   External Document

## 2011-01-30 ENCOUNTER — Encounter: Payer: Self-pay | Admitting: Family Medicine

## 2011-02-12 NOTE — Op Note (Signed)
[name], Stacy Horton           ACCOUNT NO.:  [id number]   MEDICAL RECORD NO.:  [id number]          PATIENT TYPE:  AMB   LOCATION:  DSC                          FACILITY:  [facility]   PHYSICIAN:  Lennie Muckle, MD      DATE OF BIRTH:  [birth year]   DATE OF PROCEDURE:  08/20/2007  DATE OF DISCHARGE:                               OPERATIVE REPORT   SURGEON:  Amber L. Freida Busman, M.D.   ASSISTANTFelicity Pellegrini, M.D.   PROCEDURE:  Excision of left breast tissue with needle localization.   General endotracheal anesthesia with 0.25% Marcaine with epinephrine for  local anesthetic.   INDICATIONS FOR PROCEDURE:  Ms. Klecka is a 55 year old female who  upon imaging has a lesion noted in her left breast with dilated ducts as  well as an intraductal hypoechoic mass measuring 0.6 cm.  The  recommendation was excisional biopsy.  Discussed with Ms. Ridley to  perform the needle localization of the area just to ensure that the  excised area is removed.   DETAILS OF PROCEDURE:  Ms. Sylvestre is identified in the holding suite.  She received IV antibiotics prior to incision.  She was then taken to  the operating room where she was placed in the supine position.  After  administration of general endotracheal anesthesia, her left breast  including the needle were prepped and draped in the usual sterile  fashion.  Using a #15 blade, skin was incised radially from the needle  approximately 4 cm in size using electrocautery.  I dissected following  the needle the breast tissue to the tip of the needle.  There was  expression of clear fluid which was presumed to be the cystic structure  with removal of the tissue.  The specimen was then marked with two  sutures anteriorly, short superior single stitch and long was lateral.  The tissue was then sent to radiographic imaging for further evaluation.  The needle was noted to be in place, however, due to the fluid being  removed, it was not able to be  ultrasounded for comparison.  The wound  was irrigated.  Hemostasis obtained with electrocautery.  No bleeding  was noted at the end of the case.  The wound was then closed with 3-0  Vicryl suture in interrupted fashion and a running 4-0 for the skin.  The area was then injected approximately 20 mL of 0.25% Marcaine into  the cavity as well as the skin.  Dermabond was placed for final  dressing.  The patient was then extubated, transported to the  postanesthesia care unit in stable condition.      Lennie Muckle, MD  Electronically Signed     ALA/MEDQ  D:  08/20/2007  T:  08/20/2007  Job:  [id number]

## 2011-03-11 ENCOUNTER — Other Ambulatory Visit: Payer: Self-pay | Admitting: Family Medicine

## 2011-07-09 LAB — BASIC METABOLIC PANEL
Chloride: 102
GFR calc Af Amer: >60
GFR calc non Af Amer: >60
Potassium: 3.6
Sodium: 134 — ABNORMAL LOW

## 2011-07-09 LAB — DIFFERENTIAL
Basophils Absolute: 0
Basophils Relative: 1
Neutro Abs: 2.5
Neutrophils Relative %: 40 — ABNORMAL LOW

## 2011-07-09 LAB — CBC
HCT: 28.8 — ABNORMAL LOW
Hemoglobin: 9.3 — ABNORMAL LOW
MCV: 79.8
RBC: 3.62 — ABNORMAL LOW
WBC: 6.3

## 2011-07-30 ENCOUNTER — Other Ambulatory Visit: Payer: Self-pay | Admitting: Family Medicine

## 2011-07-30 DIAGNOSIS — I10 Essential (primary) hypertension: Secondary | ICD-10-CM

## 2011-07-30 NOTE — Telephone Encounter (Signed)
Refill request

## 2011-08-06 ENCOUNTER — Other Ambulatory Visit: Payer: Self-pay | Admitting: Family Medicine

## 2011-08-06 DIAGNOSIS — Z1231 Encounter for screening mammogram for malignant neoplasm of breast: Secondary | ICD-10-CM

## 2011-08-08 ENCOUNTER — Ambulatory Visit (INDEPENDENT_AMBULATORY_CARE_PROVIDER_SITE_OTHER): Payer: 59 | Admitting: Family Medicine

## 2011-08-08 ENCOUNTER — Encounter: Payer: Self-pay | Admitting: Family Medicine

## 2011-08-08 VITALS — BP 135/84 | HR 112 | Temp 98.9°F | Ht 65.0 in | Wt 176.0 lb

## 2011-08-08 DIAGNOSIS — K649 Unspecified hemorrhoids: Secondary | ICD-10-CM

## 2011-08-08 DIAGNOSIS — E119 Type 2 diabetes mellitus without complications: Secondary | ICD-10-CM

## 2011-08-08 DIAGNOSIS — Z23 Encounter for immunization: Secondary | ICD-10-CM

## 2011-08-08 DIAGNOSIS — I10 Essential (primary) hypertension: Secondary | ICD-10-CM

## 2011-08-08 DIAGNOSIS — E785 Hyperlipidemia, unspecified: Secondary | ICD-10-CM

## 2011-08-08 MED ORDER — LISINOPRIL-HYDROCHLOROTHIAZIDE 20-12.5 MG PO TABS: 2.0000 | ORAL_TABLET | Freq: Every day | ORAL | Status: DC

## 2011-08-08 MED ORDER — HYDROCORTISONE ACETATE 25 MG RE SUPP: 25.0000 mg | Freq: Every evening | RECTAL | Status: DC | PRN

## 2011-08-08 NOTE — Patient Instructions (Addendum)
Cholesterol Control Diet Cholesterol levels in your body are determined significantly by your diet. Cholesterol levels may also be related to heart disease. The following material helps to explain this relationship and discusses what you can do to help keep your heart healthy. Not all cholesterol is bad. Low-density lipoprotein (LDL) cholesterol is the "bad" cholesterol. It may cause fatty deposits to build up inside your arteries. High-density lipoprotein (HDL) cholesterol is "good." It helps to remove the "bad" LDL cholesterol from your blood. Cholesterol is a very important risk factor for heart disease. Other risk factors are high blood pressure, smoking, stress, heredity, and weight. The heart muscle gets its supply of blood through the coronary arteries. If your LDL cholesterol is high and your HDL cholesterol is low, you are at risk for having fatty deposits build up in your coronary arteries. This leaves less room through which blood can flow. Without sufficient blood and oxygen, the heart muscle cannot function properly and you may feel chest pains (angina pectoris). When a coronary artery closes up entirely, a part of the heart muscle may die, causing a heart attack (myocardial infarction). CHECKING CHOLESTEROL When your caregiver sends your blood to a lab to be analyzed for cholesterol, a complete lipid (fat) profile may be done. With this test, the total amount of cholesterol and levels of LDL and HDL are determined. Triglycerides are a type of fat that circulates in the blood and can also be used to determine heart disease risk. The list below describes what the numbers should be: Test: Total Cholesterol.  Less than 200 mg/dl.  Test: LDL "bad cholesterol."  Less than 100 mg/dl.   Less than 70 mg/dl if you are at very high risk of a heart attack or sudden cardiac death.  Test: HDL "good cholesterol."  Greater than 50 mg/dl for women.   Greater than 40 mg/dl for men.  Test:  Triglycerides.  Less than 150 mg/dl.  CONTROLLING CHOLESTEROL WITH DIET Although exercise and lifestyle factors are important, your diet is key. That is because certain foods are known to raise cholesterol and others to lower it. The goal is to balance foods for their effect on cholesterol and more importantly, to replace saturated and trans fat with other types of fat, such as monounsaturated fat, polyunsaturated fat, and omega-3 fatty acids. On average, a person should consume no more than 15 to 17 g of saturated fat daily. Saturated and trans fats are considered "bad" fats, and they will raise LDL cholesterol. Saturated fats are primarily found in animal products such as meats, butter, and cream. However, that does not mean you need to sacrifice all your favorite foods. Today, there are good tasting, low-fat, low-cholesterol substitutes for most of the things you like to eat. Choose low-fat or nonfat alternatives. Choose round or loin cuts of red meat, since these types of cuts are lowest in fat and cholesterol. Chicken (without the skin), fish, veal, and ground turkey breast are excellent choices. Eliminate fatty meats, such as hot dogs and salami. Even shellfish have little or no saturated fat. Have a 3 oz (85 g) portion when you eat lean meat, poultry, or fish. Trans fats are also called "partially hydrogenated oils." They are oils that have been scientifically manipulated so that they are solid at room temperature resulting in a longer shelf life and improved taste and texture of foods in which they are added. Trans fats are found in stick margarine, some tub margarines, cookies, crackers, and baked goods.  When   baking and cooking, oils are an excellent substitute for butter. The monounsaturated oils are especially beneficial since it is believed they lower LDL and raise HDL. The oils you should avoid entirely are saturated tropical oils, such as coconut and palm.  Remember to eat liberally from food  groups that are naturally free of saturated and trans fat, including fish, fruit, vegetables, beans, grains (barley, rice, couscous, bulgur wheat), and pasta (without cream sauces).  IDENTIFYING FOODS THAT LOWER CHOLESTEROL  Soluble fiber may lower your cholesterol. This type of fiber is found in fruits such as apples, vegetables such as broccoli, potatoes, and carrots, legumes such as beans, peas, and lentils, and grains such as barley. Foods fortified with plant sterols (phytosterol) may also lower cholesterol. You should eat at least 2 g per day of these foods for a cholesterol lowering effect.  Read package labels to identify low-saturated fats, trans fats free, and low-fat foods at the supermarket. Select cheeses that have only 2 to 3 g saturated fat per ounce. Use a heart-healthy tub margarine that is free of trans fats or partially hydrogenated oil. When buying baked goods (cookies, crackers), avoid partially hydrogenated oils. Breads and muffins should be made from whole grains (whole-wheat or whole oat flour, instead of "flour" or "enriched flour"). Buy non-creamy canned soups with reduced salt and no added fats.  FOOD PREPARATION TECHNIQUES  Never deep-fry. If you must fry, either stir-fry, which uses very little fat, or use non-stick cooking sprays. When possible, broil, bake, or roast meats, and steam vegetables. Instead of dressing vegetables with butter or margarine, use lemon and herbs, applesauce and cinnamon (for squash and sweet potatoes), nonfat yogurt, salsa, and low-fat dressings for salads.  LOW-SATURATED FAT / LOW-FAT FOOD SUBSTITUTES Meats / Saturated Fat (g)  Avoid: Steak, marbled (3 oz/85 g) / 11 g   Choose: Steak, lean (3 oz/85 g) / 4 g   Avoid: Hamburger (3 oz/85 g) / 7 g   Choose: Hamburger, lean (3 oz/85 g) / 5 g   Avoid: Ham (3 oz/85 g) / 6 g   Choose: Ham, lean cut (3 oz/85 g) / 2.4 g   Avoid: Chicken, with skin, dark meat (3 oz/85 g) / 4 g   Choose: Chicken,  skin removed, dark meat (3 oz/85 g) / 2 g   Avoid: Chicken, with skin, light meat (3 oz/85 g) / 2.5 g   Choose: Chicken, skin removed, light meat (3 oz/85 g) / 1 g  Dairy / Saturated Fat (g)  Avoid: Whole milk (1 cup) / 5 g   Choose: Low-fat milk, 2% (1 cup) / 3 g   Choose: Low-fat milk, 1% (1 cup) / 1.5 g   Choose: Skim milk (1 cup) / 0.3 g   Avoid: Hard cheese (1 oz/28 g) / 6 g   Choose: Skim milk cheese (1 oz/28 g) / 2 to 3 g   Avoid: Cottage cheese, 4% fat (1 cup) / 6.5 g   Choose: Low-fat cottage cheese, 1% fat (1 cup) / 1.5 g   Avoid: Ice cream (1 cup) / 9 g   Choose: Sherbet (1 cup) / 2.5 g   Choose: Nonfat frozen yogurt (1 cup) / 0.3 g   Choose: Frozen fruit bar / trace   Avoid: Whipped cream (1 tbs) / 3.5 g   Choose: Nondairy whipped topping (1 tbs) / 1 g  Condiments / Saturated Fat (g)  Avoid: Mayonnaise (1 tbs) / 2 g   Choose: Low-fat mayonnaise (  1 tbs) / 1 g   Avoid: Butter (1 tbs) / 7 g   Choose: Extra light margarine (1 tbs) / 1 g   Avoid: Coconut oil (1 tbs) / 11.8 g   Choose: Olive oil (1 tbs) / 1.8 g   Choose: Corn oil (1 tbs) / 1.7 g   Choose: Safflower oil (1 tbs) / 1.2 g   Choose: Sunflower oil (1 tbs) / 1.4 g   Choose: Soybean oil (1 tbs) / 2.4 g   Choose: Canola oil (1 tbs) / 1 g  Document Released: 09/16/2005 Document Revised: 05/29/2011 Document Reviewed: 03/07/2011 [facility] Patient Information 2012 [place], [place].    It has been a pleasure to meet you! Continue your diet and exercise regimen.  Please make an appointment for labs test. I will call you if they return with abnormal results otherwise I will sent you a letter. Make a f/u  with me appointment in 3-4 months.

## 2011-08-09 NOTE — Progress Notes (Signed)
  Subjective:    Patient ID: Stacy Horton, female    DOB: 06-02-1956, 55 y.o.   MRN: [id number]  HPI Very pleasant 55 y/o F with Hx of DM type 2, HTN, HLD, GERD that comes for a regular office visit. She has no complaints today except for difficulty with her sleep. This is described as difficulty falling sleep. There is no problems maintaining her sleep. She mentions that takes coffee about 1 cup after 6:00 pm ussually with her dinner and start working on her projects until 3:00 am, then  She falls sleep and wake up tired. During the day she reports being tired and sleepy. No hx of snoring or SOB, orthopnea.  1. DMT2; No medication at this moment. A1C today of 6.8. Has been around 6.1 in the past. Does eat healthy and exercise.  2. HTN: BP controlled with HCTZ/ Lisinopril (12.5/20) BID and Nebibolol 10 mg daily. she is compliant.  No chest pain, no palpitations 3. Hyperlipidemia: Last LDL 154 on 10/2009. Does not want to take statins since in the past she has experienced muscle pain with various of them. Interested in diet management of this condition. 4. GERD: Asymptomatic  6. Overweight: pt knows and is working with her diet to make more healthy choices.  Review of Systems Per HPI    Objective:   Physical Exam  Constitutional: She is oriented to person, place, and time. She appears well-developed and well-nourished. No distress.  HENT:  Head: Normocephalic and atraumatic.  Right Ear: External ear normal.  Left Ear: External ear normal.  Mouth/Throat: Oropharynx is clear and moist. No oropharyngeal exudate.  Eyes: EOM are normal. Pupils are equal, round, and reactive to light.  Neck: Neck supple. No thyromegaly present.  Cardiovascular: Normal rate, regular rhythm and normal heart sounds.   Pulmonary/Chest: Breath sounds normal.  Abdominal: Soft. Bowel sounds are normal.  Musculoskeletal: Normal range of motion. She exhibits no edema.  Lymphadenopathy:    She has no cervical  adenopathy.  Neurological: She is alert and oriented to person, place, and time. She has normal reflexes.          Assessment & Plan:

## 2011-08-09 NOTE — Assessment & Plan Note (Addendum)
Controlled (134/84 today) on HCTZ /Lisinopril and Nebivolol. Not taking amlodipine. Plan: Continue treatment.

## 2011-08-09 NOTE — Assessment & Plan Note (Signed)
A1C 6.8. Was 6.1 in the past. No medication for this condition. Controlled with diet and exercise.  Plan: Continue with D/E regimen, but we discussed the fact that her A1C has been increasing and although she is on goal; It will be good if she continues her efforts to maintain A1C under 7.

## 2011-08-09 NOTE — Assessment & Plan Note (Signed)
Asymptomatic. 

## 2011-08-09 NOTE — Assessment & Plan Note (Signed)
No symptoms at this time but wants prescription for it since this is a chronic problem.

## 2011-08-09 NOTE — Assessment & Plan Note (Signed)
Last LDL 154 last year on Feb. No statins due to muscle pain related secondary efect. Plan: Lipid profile to evaluate. Continue D/E. Offered counseling about diet low in cholesterol. Pt positive an enthusiastic about healthy diet options.

## 2011-08-14 ENCOUNTER — Other Ambulatory Visit: Payer: 59

## 2011-08-30 ENCOUNTER — Other Ambulatory Visit: Payer: 59

## 2011-08-30 DIAGNOSIS — I10 Essential (primary) hypertension: Secondary | ICD-10-CM

## 2011-08-30 DIAGNOSIS — E785 Hyperlipidemia, unspecified: Secondary | ICD-10-CM

## 2011-08-30 LAB — CBC
HCT: 38.6 % (ref 36.0–46.0)
MCH: 28.6 pg (ref 26.0–34.0)
MCHC: 32.4 g/dL (ref 30.0–36.0)
MCV: 88.3 fL (ref 78.0–100.0)
Platelets: 219 10*3/uL (ref 150–400)
RDW: 17.2 % — ABNORMAL HIGH (ref 11.5–15.5)

## 2011-08-30 LAB — LIPID PANEL
Cholesterol: 235 mg/dL — ABNORMAL HIGH (ref 0–200)
HDL: 51 mg/dL (ref >39)
Total CHOL/HDL Ratio: 4.6 Ratio
Triglycerides: 74 mg/dL (ref <150)

## 2011-08-30 LAB — BASIC METABOLIC PANEL
BUN: 10 mg/dL (ref 6–23)
CO2: 26 mEq/L (ref 19–32)
Creat: 0.84 mg/dL (ref 0.50–1.10)
Glucose, Bld: 113 mg/dL — ABNORMAL HIGH (ref 70–99)
Sodium: 142 mEq/L (ref 135–145)

## 2011-08-30 NOTE — Progress Notes (Signed)
CBC,FLP,BMP AND TSH DONE TODAY Stacy Horton

## 2011-09-04 ENCOUNTER — Ambulatory Visit
Admission: RE | Admit: 2011-09-04 | Discharge: 2011-09-04 | Disposition: A | Discharge status: Home / Self Care | Payer: 59 | Source: Ambulatory Visit | Attending: Family Medicine | Admitting: Family Medicine

## 2011-09-04 DIAGNOSIS — Z1231 Encounter for screening mammogram for malignant neoplasm of breast: Secondary | ICD-10-CM

## 2011-09-28 ENCOUNTER — Encounter: Payer: Self-pay | Admitting: Family Medicine

## 2011-10-25 ENCOUNTER — Ambulatory Visit: Payer: 59 | Admitting: Family Medicine

## 2011-10-28 ENCOUNTER — Encounter: Payer: Self-pay | Admitting: Family Medicine

## 2011-10-28 ENCOUNTER — Ambulatory Visit (INDEPENDENT_AMBULATORY_CARE_PROVIDER_SITE_OTHER): Payer: 59 | Admitting: Family Medicine

## 2011-10-28 VITALS — BP 132/81 | HR 64 | Temp 97.3°F | Ht 65.0 in | Wt 179.0 lb

## 2011-10-28 DIAGNOSIS — I10 Essential (primary) hypertension: Secondary | ICD-10-CM

## 2011-10-28 DIAGNOSIS — K649 Unspecified hemorrhoids: Secondary | ICD-10-CM

## 2011-10-28 DIAGNOSIS — G4700 Insomnia, unspecified: Secondary | ICD-10-CM

## 2011-10-28 DIAGNOSIS — E785 Hyperlipidemia, unspecified: Secondary | ICD-10-CM

## 2011-10-28 MED ORDER — NIACIN 500 MG PO TABS: 500.0000 mg | ORAL_TABLET | Freq: Every day | ORAL | Status: DC

## 2011-10-28 NOTE — Patient Instructions (Addendum)
Take Niacor 500 mg with dinner every day. You can experience facial flushing as a secondary effect. This will last for 10-20 min if it happens. If other symptoms appear, stop taking medication and notify us. If not secondary effects please give [place] a call, so we can start adjusting dose. Will recheck cholesterol in 8 weeks.

## 2011-10-29 MED ORDER — NEBIVOLOL HCL 10 MG PO TABS: 10.0000 mg | ORAL_TABLET | Freq: Every day | ORAL | Status: DC

## 2011-10-29 MED ORDER — LISINOPRIL-HYDROCHLOROTHIAZIDE 20-12.5 MG PO TABS: 2.0000 | ORAL_TABLET | Freq: Every day | ORAL | Status: DC

## 2011-10-29 MED ORDER — HYDROCORTISONE ACETATE 25 MG RE SUPP: 25.0000 mg | Freq: Every evening | RECTAL | Status: DC | PRN

## 2011-10-30 DIAGNOSIS — G4700 Insomnia, unspecified: Secondary | ICD-10-CM | POA: Insufficient documentation

## 2011-10-30 NOTE — Assessment & Plan Note (Signed)
Pt f/u with [facility]. Dr Rachelle Hora and had a recent routine EKG. No symptomatic. BP controlled on Lisinopril/HCTZ and Nebivolol.  Plan: Continue with medication. No change in plan. Meds refilled. Pt wants to sign release of information so we can have access to her EKG results.

## 2011-10-30 NOTE — Assessment & Plan Note (Signed)
Difficulty falling asleep. No snoring or difficulty while sleeping. No problems in waking up. Sleepiness episodes during the day. Drinks coffee after 6:00PM  Plan: Sleep hygene discussed F/u in next visit.

## 2011-10-30 NOTE — Assessment & Plan Note (Addendum)
LDL 169. Only on fish oil (inconsistently) and diet. No medication due to side effects of myalgia. Plan: Discussed with pt and precepted with attending to start on Niacin. Pt agreeable with plan, side effect of niacin discussed and pt showed understanding.  Will recheck LDL in 8 weeks if pt compliant with tx.

## 2011-10-30 NOTE — Assessment & Plan Note (Signed)
Pt uses anusol frequently and it helps with crisis. Pt requested refill of medication. Refill sent to pharmacy

## 2011-10-30 NOTE — Progress Notes (Signed)
  Subjective:    Patient ID: Stacy Horton, female    DOB: 1956-07-13, 56 y.o.   MRN: [id number]  HPI Pt comes today to discuss her cholesterol LDL levels. She was diagnosed with Hyperlipidemia in 2008 with LDL on 170. She tried Atorvastatin and posteriorly Ezetimibe with secondary effects of muscle pain that resolved after discontinuing the medication. Has been taking irregularly fish oil. The lowest LDL has been 145 and this was with an intense exercise regimen. Pt very open to recommendations and eager to "do" something in favor of lowering her cholesterol. Diet alone has not worked effectively since now LDL is 169. Her other complain is related to sleep. She mentions is hard for her to fall sleep, but no difficulty with staying asleep. During the day she feels sleepy. Denies snoring. Sometimes she takes coffee before bed time with the expectation that something warm will help her to sleep.  Review of Systems  Constitutional: Negative.   HENT: Negative.   Eyes: Negative.   Respiratory: Negative.  Negative for shortness of breath.   Cardiovascular: Negative for chest pain and palpitations.  Gastrointestinal: Negative.   Genitourinary: Negative.   Musculoskeletal: Negative.   Neurological: Negative.        Objective:   Physical Exam  Constitutional: She is oriented to person, place, and time. She appears well-developed and well-nourished. No distress.  HENT:  Mouth/Throat: Oropharynx is clear and moist.  Eyes: Conjunctivae and EOM are normal. Pupils are equal, round, and reactive to light.  Neck: Neck supple. No thyromegaly present.  Cardiovascular: Normal rate, regular rhythm, normal heart sounds and intact distal pulses.   No murmur heard. Pulmonary/Chest: Effort normal and breath sounds normal. No respiratory distress. She has no wheezes.  Abdominal: Bowel sounds are normal. She exhibits no distension. There is no tenderness.  Musculoskeletal: Normal range of motion. She  exhibits no edema.  Neurological: She is alert and oriented to person, place, and time. She has normal reflexes.          Assessment & Plan:

## 2012-02-18 ENCOUNTER — Ambulatory Visit (INDEPENDENT_AMBULATORY_CARE_PROVIDER_SITE_OTHER): Payer: Self-pay | Admitting: Sports Medicine

## 2012-02-18 ENCOUNTER — Telehealth: Payer: Self-pay | Admitting: Family Medicine

## 2012-02-18 ENCOUNTER — Encounter: Payer: Self-pay | Admitting: Sports Medicine

## 2012-02-18 DIAGNOSIS — R609 Edema, unspecified: Secondary | ICD-10-CM

## 2012-02-18 DIAGNOSIS — K649 Unspecified hemorrhoids: Secondary | ICD-10-CM

## 2012-02-18 DIAGNOSIS — I10 Essential (primary) hypertension: Secondary | ICD-10-CM

## 2012-02-18 MED ORDER — FUROSEMIDE 20 MG PO TABS: 20.0000 mg | ORAL_TABLET | Freq: Two times a day (BID) | ORAL | Status: DC | PRN

## 2012-02-18 MED ORDER — FUROSEMIDE 20 MG PO TABS: 20.0000 mg | ORAL_TABLET | Freq: Every day | ORAL | Status: DC

## 2012-02-18 MED ORDER — FUROSEMIDE 20 MG PO TABS: 20.0000 mg | ORAL_TABLET | Freq: Two times a day (BID) | ORAL | Status: AC | PRN

## 2012-02-18 MED ORDER — HYDROCORTISONE ACETATE 25 MG RE SUPP: 25.0000 mg | Freq: Every evening | RECTAL | Status: DC | PRN

## 2012-02-18 NOTE — Patient Instructions (Signed)
It was nice to see you today.  The history you told me and your physical exam is NOT consistent with a blood clot.  I would like to treat your BP and your swelling with Lasix.  You can take this twice a day until your swelling improves.    Please plan to return to see Dr. Aviva Signs in 2-3 weeks to check on your swelling and follow up from your previous visit.  If you need anything prior to seeing me please call the clinic.

## 2012-02-18 NOTE — Assessment & Plan Note (Signed)
Will refill anusol at pts request.  Encourage high fiber diet

## 2012-02-18 NOTE — Assessment & Plan Note (Addendum)
No evidence of DVT; inconsistent history with DVT - pt reassured Restarted lasix 20bid prn for swelling. Consider compression hose if recurrent

## 2012-02-18 NOTE — Telephone Encounter (Signed)
Error

## 2012-02-18 NOTE — Progress Notes (Signed)
Patient ID: Stacy Horton, female   DOB: 1956-09-26, 56 y.o.   MRN: [id number] HPI:  Stacy Horton is a 56 y.o. female presenting today for evaluation of B LE swelling. Concerned regarding DVT.  B leg swelling started 2 days ago, improved initially with rest and elevation.  Worsened yesterday and no relieved by elevation.  Has had this in the past; worse with prolonged time on feet or sitting with knee flexed.  Swelling equal Bilaterally.  No recent immobilization/long travel, non-smoker, no hormone supplementation, no known hypercoagulable state.    No chest pain, no dyspnea, no hemoptysis, no orthostasis, no recently palpitations.    Does have chronic constipation.  Been using suppositories; requesting refill.  No melana reported  BP has been elevated.  Recently changed to Coreg from bystolic due to insurance issues.  Has only been taking coreg qday  ROS Per HPI  HISTORY Medications Reviewed & Updated, see associated section Medical Hx Reviewed: Significant for HTN Family History Reviewed: Sister with remote history of 1 DVT; no known hypercoag state Social History Reviewed:  Significant for non-smoker  PE: GENERAL:  Adult AA female.  Examined in [facility].  Sitting nervously in exam room chair  In no discomfort; norespiratory distress.   PSYCH: Alert and appropriately interactive; Insight:Good   H&N: AT/[place], MMM, no scleral icterus, EOMi THORAX: HEART: RRR, S1/S2 heard, no murmur LUNGS: CTA B, no wheezes, no crackles EXTREMITIES: Moves all 4 extremities spontaneously, warm well perfused, 2+ Pretibial edema, bilateral DP and PT pulses 2/4.  Negative homan's.  No unilateral swelling, no erythma, no palpable cords

## 2012-02-18 NOTE — Assessment & Plan Note (Signed)
Elevated.  Pt reports Bystolic recently changed to Coreg at [facility] but only taking qday not BID. Been on lasix in past but not currently Reports high salt diet - discussed risks of high Na diet

## 2012-05-19 ENCOUNTER — Ambulatory Visit (INDEPENDENT_AMBULATORY_CARE_PROVIDER_SITE_OTHER): Payer: Self-pay | Admitting: Family Medicine

## 2012-05-19 ENCOUNTER — Telehealth: Payer: Self-pay | Admitting: Family Medicine

## 2012-05-19 VITALS — BP 124/81 | HR 76 | Ht 65.0 in | Wt 177.0 lb

## 2012-05-19 DIAGNOSIS — K649 Unspecified hemorrhoids: Secondary | ICD-10-CM

## 2012-05-19 LAB — CBC WITH DIFFERENTIAL/PLATELET
Basophils Relative: 1 % (ref 0–1)
HCT: 29.4 % — ABNORMAL LOW (ref 36.0–46.0)
Hemoglobin: 10 g/dL — ABNORMAL LOW (ref 12.0–15.0)
Lymphocytes Relative: 64 % — ABNORMAL HIGH (ref 12–46)
MCHC: 34 g/dL (ref 30.0–36.0)
Monocytes Absolute: 0.4 10*3/uL (ref 0.1–1.0)
Monocytes Relative: 6 % (ref 3–12)
Neutro Abs: 1.6 10*3/uL — ABNORMAL LOW (ref 1.7–7.7)

## 2012-05-19 MED ORDER — POLYETHYLENE GLYCOL 3350 17 GM/SCOOP PO POWD: 17.0000 g | Freq: Two times a day (BID) | ORAL | Status: AC | PRN

## 2012-05-19 MED ORDER — HYDROCORTISONE ACETATE 25 MG RE SUPP: 25.0000 mg | Freq: Every evening | RECTAL | Status: DC | PRN

## 2012-05-19 NOTE — Telephone Encounter (Signed)
Patient reports problem with  hemorrhoids for past 2 weeks. Has had problems in past. Has had some recent problem with constipation and bleeding. Has used suppositories  prescribed by Dr. Loreta Ave and prep H without improvement. Today had a soft stool with heavy amount of blood.  She does not bleed unless having a stool.  Consulted with Dr. Gloriann Loan and he advises needs to be seen today and will need CBC when she comes in.

## 2012-05-19 NOTE — Progress Notes (Signed)
Subjective:     Patient ID: Stacy Horton, female   DOB: 1955/12/03, 56 y.o.   MRN: [id number]  CC: rectal bleeding and pain x 1 week  HPI This is a 56 y.o. AA female with h/o hemorrhoids, DM type II, HLD, and HTN here for rectal bleeding and pain.  Pt states she has had constipation with straining x 2 weeks and rectal bleeding and burning pain x 1 week.  Bleeding is like a period every time she strains to have a BM, which has been 3 times this week.  She has tried stool softener for 2-3 nights which she stopped on Sunday and tried preparation H and prune juice.  Today she had a soft formed BM but bleeding continues.  She has also used hydrocortisone suppository PRN with no relief.  She felt a hemorrhoid pop out externally and was able to push it inside until today, when she could no longer push it inside.  She is sure bleeding from anus and not vagina or urine.  No pus or discharge and dyspareunia.  Review of Systems  Constitutional: Negative for fever and chills.  Respiratory: Negative for shortness of breath.   Cardiovascular: Negative for chest pain.  GI - negative for nausea and vomiting  PMH, FH, and SH reviewed.  SH: Pt denies tobacco, drug use, drinks 2 beers weekly FH: No known relatives with colon cancer history    Objective:   Physical Exam BP 124/81  Pulse 76  Ht 5\' 5"  (1.651 m)  Wt 177 lb (80.287 kg)  BMI 29.45 kg/m2 GEN: well-nourished well-appearing AA female in NAD sitting at side of exam table CV: RRR, no murmurs, rubs, gallops; 2+ bilateral radial pulses PULM: CTAB with no increased WOB ABD: soft, mildly TTP RLQ, nondistended, hypoactive bowel sounds GU: Anus with 3 external hemorrhoids, non-sclerosed/non-firm to palpation, mildly tender; anoscopy with 2 internal hemorrhoids and mild amount of blood visualized; rectal exam with 1 internal hemorrhoid palpated and mild amt of tenderness on exam     Assessment:     This is a 56 y.o. AA female with h/o  hemorrhoids, DM type II, HLD, and HTN here for rectal bleeding and pain.     Plan:

## 2012-05-19 NOTE — Patient Instructions (Signed)
It was great to meet you today. - Please schedule an appointment with Dr. Loreta Ave to evaluate need for colonoscopy and to further evaluate your rectal bleeding. - Your rectal bleeding seems to be due to hemorrhoids and straining.  You can try miralax twice a day until your stools soften and you do not have to strain to have a BM.  You can also continue preparation H and your suppositories as needed.  If you start having diarrhea, I would recommend stopping miralax until stools firm up again. - Please take other medications as prescribed. - You can follow-up with [place] if your symptoms worsen or if you are unable to get an appointment soon with Dr. Loreta Ave.  Otherwise, you can follow up with your PCP as scheduled. - If you feel dizzy, lose consciousness, feel extremely weak or tired, or have any difficulty breathing, please seek immediate medical attention.Hemorrhoids Hemorrhoids are veins in the rectum that get big. These veins can get blocked. Blocked veins become puffy (swollen) and painful. HOME CARE  Eat more fiber.   Drink enough fluid to keep your pee (urine) clear or pale yellow.   Exercise often.   Avoid straining to poop (bowel movement).   Keep the butt area dry and clean.   Only take medicine as told by your doctor.  If your hemorrhoids are puffy and painful:  Take a warm bath for 20 to 30 minutes. Do this 3 to 4 times a day.   Place ice packs on the area. Use the ice packs between the baths.   Put ice in a plastic bag.   Place a towel between your skin and the bag.   Leave the ice on for 15 to 20 minutes, 3 to 4 times a day.   Do not use a donut-shaped pillow. Do not sit on the toilet for a long time.   Go to the bathroom when your body has the urge to poop. This is so you do not strain as much to poop.  GET HELP RIGHT AWAY IF:    You have increasing pain that is not controlled with medicine.   You have uncontrolled bleeding.   You cannot poop.   You have pain or  puffiness outside the area of the hemorrhoids.   You have chills.   You have a temperature by mouth above 102 F (38.9 C), not controlled by medicine.  MAKE SURE YOU:    Understand these instructions.   Will watch your condition.   Will get help right away if you are not doing well or get worse.  Document Released: 06/25/2008 Document Revised: 09/05/2011 Document Reviewed: 06/25/2008 [facility] Patient Information 2012 [place], [place].

## 2012-05-19 NOTE — Progress Notes (Deleted)
Patient ID: Stacy Horton, female   DOB: [birth year], 56 y.o.   MRN: [id number]

## 2012-05-19 NOTE — Assessment & Plan Note (Signed)
Given amt of rectal bleeding reported, want to rule out colon cancer.  Pt reports 2010 normal colonoscopy but results not found and may not be bad idea to repeat anyway to rule out new findings. - Encouraged patient to stop straining. - Referral to GI, preferred to see Dr. Loreta Ave as she has seen her before - Miralax bid - Continue suppository and preparation h prn - CBC today, call pt with any abnormal values - Stop ASA until visit with Dr. Loreta Ave or f/u here. - f/u with [place] if symptoms worsen or do not improve

## 2012-05-19 NOTE — Telephone Encounter (Signed)
Patient would like to speak to the nurse about hemorrhoids with heavy bleeding.  None of the over counter meds seem to help.

## 2012-05-30 ENCOUNTER — Ambulatory Visit (INDEPENDENT_AMBULATORY_CARE_PROVIDER_SITE_OTHER): Payer: Self-pay | Admitting: Emergency Medicine

## 2012-05-30 VITALS — BP 128/84 | HR 61 | Resp 16 | Ht 64.5 in | Wt 174.2 lb

## 2012-05-30 DIAGNOSIS — R55 Syncope and collapse: Secondary | ICD-10-CM

## 2012-05-30 DIAGNOSIS — K5900 Constipation, unspecified: Secondary | ICD-10-CM

## 2012-05-30 DIAGNOSIS — K625 Hemorrhage of anus and rectum: Secondary | ICD-10-CM

## 2012-05-30 DIAGNOSIS — E119 Type 2 diabetes mellitus without complications: Secondary | ICD-10-CM

## 2012-05-30 LAB — POCT CBC
HCT, POC: 33.4 % — AB (ref 37.7–47.9)
Hemoglobin: 9.6 g/dL — AB (ref 12.2–16.2)
Lymph, poc: 3.1 (ref 0.6–3.4)
MCH, POC: 26.2 pg — AB (ref 27–31.2)
MCHC: 28.7 g/dL — AB (ref 31.8–35.4)
POC LYMPH PERCENT: 51 %L — AB (ref 10–50)
POC MID %: 7 %M (ref 0–12)
RDW, POC: 14 %
WBC: 6 10*3/uL (ref 4.6–10.2)

## 2012-05-30 LAB — BASIC METABOLIC PANEL
CO2: 30 mEq/L (ref 19–32)
Chloride: 103 mEq/L (ref 96–112)
Glucose, Bld: 98 mg/dL (ref 70–99)
Potassium: 4.1 mEq/L (ref 3.5–5.3)
Sodium: 138 mEq/L (ref 135–145)

## 2012-05-30 NOTE — Progress Notes (Signed)
Subjective:    Stacy Horton is a 56 y.o. female who presents for evaluation of syncope. Onset was 12 hours ago. Symptoms have  completely resolved since that time. Patient describes the episode as a sudden loss of consciousness without warning. Associated symptoms: cramping in the legs and dizziness. The patient denies general feeling of lightheadedness. Medications putting patient at risk for syncope: hypotensive medications.  The following portions of the patient's history were reviewed and updated as appropriate: allergies, current medications, past family history, past medical history, past social history, past surgical history and problem list.  Review of Systems A comprehensive review of systems was negative.   Objective:    BP 128/84  Pulse 61  Resp 16  Ht 5' 4.5" (1.638 m)  Wt 174 lb 3.2 oz (79.017 kg)  BMI 29.44 kg/m2  SpO2 98%  General Appearance:    Alert, cooperative, no distress, appears stated age  Head:    Normocephalic, without obvious abnormality, atraumatic  Eyes:    PERRL, conjunctiva/corneas clear, EOM's intact, fundi    benign, both eyes  Ears:    Normal TM's and external ear canals, both ears  Nose:   Nares normal, septum midline, mucosa normal, no drainage    or sinus tenderness  Throat:   Lips, mucosa, and tongue normal; teeth and gums normal  Neck:   Supple, symmetrical, trachea midline, no adenopathy;    thyroid:  no enlargement/tenderness/nodules; no carotid   bruit or JVD  Back:     Symmetric, no curvature, ROM normal, no CVA tenderness  Lungs:     Clear to auscultation bilaterally, respirations unlabored  Chest Wall:    No tenderness or deformity   Heart:    Regular rate and rhythm, S1 and S2 normal, no murmur, rub   or gallop  Breast Exam:    No tenderness, masses, or nipple abnormality  Abdomen:     Soft, non-tender, bowel sounds active all four quadrants,    no masses, no organomegaly        Extremities:   Extremities normal, atraumatic,  no cyanosis or edema  Pulses:   2+ and symmetric all extremities  Skin:   Skin color, texture, turgor normal, no rashes or lesions  Lymph nodes:   Cervical, supraclavicular, and axillary nodes normal  Neurologic:   CNII-XII intact, normal strength, sensation and reflexes    throughout    Cardiographics ECG: no acutre change. regular sinus rhythm   Assessment:    Loss of consciousness of uncertain cause   Plan:    ECG. Refer to [facility].   Results for orders placed in visit on 05/30/12  GLUCOSE, POCT (MANUAL RESULT ENTRY)      Component Value Range   POC Glucose 94  70 - 99 mg/dl  POCT CBC      Component Value Range   WBC 6.0  4.6 - 10.2 K/uL   Lymph, poc 3.1  0.6 - 3.4   POC LYMPH PERCENT 51.0 (*) 10 - 50 %L   MID (cbc) 0.4  0 - 0.9   POC MID % 7.0  0 - 12 %M   POC Granulocyte 2.5  2 - 6.9   Granulocyte percent 42.0  37 - 80 %G   RBC 3.66 (*) 4.04 - 5.48 M/uL   Hemoglobin 9.6 (*) 12.2 - 16.2 g/dL   HCT, POC 16.1 (*) 09.6 - 47.9 %   MCV 91.2  80 - 97 fL   MCH, POC 26.2 (*) 27 -  31.2 pg   MCHC 28.7 (*) 31.8 - 35.4 g/dL   RDW, POC 16.1     Platelet Count, POC 369  142 - 424 K/uL   MPV 7.5  0 - 99.8 fL

## 2012-06-05 ENCOUNTER — Telehealth: Payer: Self-pay | Admitting: Family Medicine

## 2012-06-05 MED ORDER — FERROUS SULFATE 325 (65 FE) MG PO TABS: 325.0000 mg | ORAL_TABLET | Freq: Every day | ORAL | Status: DC

## 2012-06-05 NOTE — Telephone Encounter (Signed)
call back to go over hemoglobin and need for f/u with GI, which was discussed at last visit.  Wish to start iron 1 feSO4 1-3 x a day and f/u with PCP in 1 month after starting oral iron.  Will send pt note in the mail.  F/u with PCP.

## 2012-06-05 NOTE — Telephone Encounter (Addendum)
Called pt and left message to call back to go over hemoglobin and need for f/u with GI, which was discussed at last visit.  Wish to start iron 1 feSO4 1-3 x a day and f/u with PCP in 1 month after starting oral iron.  Will send pt a letter in the mail about this as well.  Stacy Horton 06/05/2012 4:29 PM

## 2012-06-11 ENCOUNTER — Telehealth: Payer: Self-pay

## 2012-06-11 NOTE — Telephone Encounter (Signed)
Spoke with Arline Asp to let her know that I would need a signed release from the patient before I can send records over to [facility].  We did not refer the patient over to see Dr. Loreta Ave.  Arline Asp stated that she would get the patient to sign a release and then I could send the records over to them.

## 2012-06-11 NOTE — Telephone Encounter (Signed)
I faxed over the requested information per agreement will receive release for patient.

## 2012-06-11 NOTE — Telephone Encounter (Signed)
PT IS IN OFFICE NOWCINDY FROM [facility] STATES WE DID LABS ON PT AND THEY NEED THEM FAXED ASAP PLEASE FAX TO [phone] AND THE PHONE NUMBER IS [phone]

## 2012-06-20 ENCOUNTER — Ambulatory Visit: Payer: Self-pay

## 2012-06-20 ENCOUNTER — Ambulatory Visit (INDEPENDENT_AMBULATORY_CARE_PROVIDER_SITE_OTHER): Payer: Self-pay | Admitting: Emergency Medicine

## 2012-06-20 VITALS — BP 126/72 | HR 76 | Temp 98.5°F | Resp 16 | Ht 65.0 in | Wt 177.0 lb

## 2012-06-20 DIAGNOSIS — M79609 Pain in unspecified limb: Secondary | ICD-10-CM

## 2012-06-20 DIAGNOSIS — M545 Low back pain, unspecified: Secondary | ICD-10-CM

## 2012-06-20 DIAGNOSIS — M79606 Pain in leg, unspecified: Secondary | ICD-10-CM

## 2012-06-20 DIAGNOSIS — M549 Dorsalgia, unspecified: Secondary | ICD-10-CM

## 2012-06-20 MED ORDER — HYDROCODONE-ACETAMINOPHEN 5-325 MG PO TABS: 1.0000 | ORAL_TABLET | Freq: Four times a day (QID) | ORAL | Status: DC | PRN

## 2012-06-20 MED ORDER — PREDNISONE 20 MG PO TABS: ORAL_TABLET | ORAL | Status: DC

## 2012-06-20 NOTE — Progress Notes (Signed)
  Subjective:    Patient ID: Stacy Horton, female    DOB: 10-24-1955, 56 y.o.   MRN: [id number]  HPI patient hear with pain in her back and her right interior thigh she has a HX of back disease  .   Review of Systems     Objective:   Physical Exam  There is tenderness over the lumbar spine on the right.  Deep tendon reflexes are two plus and symmetrical.  She has weakness with flexion of the right hip.  [facility] reading (PRIMARY) by  Dr.Daub L4 L5 disc disease with spodylolisthesis      Assessment & Plan:  Will treat with pred and pain meds

## 2012-06-30 ENCOUNTER — Telehealth: Payer: Self-pay

## 2012-06-30 NOTE — Telephone Encounter (Signed)
Stacy Horton is scheduled on 07/03/12 for MR lumbar Spine w/o contrast.  We do not have insurance on file, however Stacy Horton told [facility] she has insurance.  Left message today for pt to call referral dept so we can get insurance info and pre-cert prior to her appointment.

## 2012-07-01 ENCOUNTER — Other Ambulatory Visit: Payer: Self-pay | Admitting: Obstetrics and Gynecology

## 2012-07-01 NOTE — Telephone Encounter (Signed)
[place],   Can this pt have this rx? 

## 2012-07-02 ENCOUNTER — Telehealth: Payer: Self-pay

## 2012-07-02 MED ORDER — DIAZEPAM 5 MG PO TABS: ORAL_TABLET | ORAL | Status: DC

## 2012-07-02 NOTE — Telephone Encounter (Signed)
Medication signed and printed.

## 2012-07-02 NOTE — Telephone Encounter (Signed)
I have pended this please advise on medication

## 2012-07-02 NOTE — Telephone Encounter (Signed)
Thanks I called patient to advise it was sent in for her.

## 2012-07-02 NOTE — Telephone Encounter (Signed)
Pt is requesting anxiety medication to take before her MRI tomorrow night.  [facility] [address] [facility]    CBN: [phone]

## 2012-07-03 ENCOUNTER — Ambulatory Visit
Admission: RE | Admit: 2012-07-03 | Discharge: 2012-07-03 | Disposition: A | Discharge status: Home / Self Care | Payer: No Typology Code available for payment source | Source: Ambulatory Visit | Attending: Emergency Medicine | Admitting: Emergency Medicine

## 2012-07-03 DIAGNOSIS — M79606 Pain in leg, unspecified: Secondary | ICD-10-CM

## 2012-07-03 DIAGNOSIS — M549 Dorsalgia, unspecified: Secondary | ICD-10-CM

## 2012-07-04 ENCOUNTER — Other Ambulatory Visit: Payer: Self-pay

## 2012-07-06 NOTE — Addendum Note (Signed)
Addended by: Cydney Ok on: 07/06/2012 11:27 AM   Modules accepted: Orders

## 2012-07-07 ENCOUNTER — Other Ambulatory Visit: Payer: Self-pay | Admitting: Radiology

## 2012-07-07 ENCOUNTER — Telehealth: Payer: Self-pay

## 2012-07-07 NOTE — Telephone Encounter (Signed)
Patient states she called about Rx for Tylenol with Codeine please advise., for lumbar pain

## 2012-07-07 NOTE — Telephone Encounter (Signed)
Patient states she spoke to somone about having a med (tylonol with codine) called in today. I don't see any message about that. Please address and call patient once called in.

## 2012-07-07 NOTE — Clinical Social Work Note (Signed)
Stacy Horton has been seen at my office. She is to be on light duty at work, to include no climbing, no lifting greater than 20 pounds, no twisting or bending until seen by the nuerosurgeon. If you have any questions, please contact my office at [phone].  Sincerely,  Dr Earl Lites, MD

## 2012-07-08 ENCOUNTER — Other Ambulatory Visit: Payer: Self-pay

## 2012-07-08 MED ORDER — HYDROCODONE-ACETAMINOPHEN 5-325 MG PO TABS: 1.0000 | ORAL_TABLET | Freq: Four times a day (QID) | ORAL | Status: DC | PRN

## 2012-07-08 NOTE — Telephone Encounter (Signed)
It looks like Dr. Cleta Alberts prescribed Norco today 07/08/2012

## 2012-07-08 NOTE — Telephone Encounter (Signed)
Rx sent in earlier today, pt aware. See notes under MRI result notes.

## 2012-07-27 ENCOUNTER — Telehealth: Payer: Self-pay

## 2012-07-27 NOTE — Telephone Encounter (Signed)
Called patient, letter completed for her. She can come by and pick up. I can not send anywhere, but she can pick up.

## 2012-07-27 NOTE — Telephone Encounter (Signed)
The patient called to request a note stating the nature of her back problems for her employer.  Please call the patient at [phone].

## 2012-12-11 ENCOUNTER — Other Ambulatory Visit: Payer: Self-pay | Admitting: *Deleted

## 2012-12-11 DIAGNOSIS — I10 Essential (primary) hypertension: Secondary | ICD-10-CM

## 2012-12-11 MED ORDER — LISINOPRIL-HYDROCHLOROTHIAZIDE 20-12.5 MG PO TABS: 2.0000 | ORAL_TABLET | Freq: Every day | ORAL | Status: DC

## 2013-03-03 ENCOUNTER — Other Ambulatory Visit: Payer: Self-pay | Admitting: *Deleted

## 2013-03-03 MED ORDER — CARVEDILOL 6.25 MG PO TABS: 6.2500 mg | ORAL_TABLET | Freq: Two times a day (BID) | ORAL | Status: DC

## 2013-03-12 ENCOUNTER — Ambulatory Visit: Payer: No Typology Code available for payment source | Admitting: Cardiovascular Disease

## 2013-03-21 ENCOUNTER — Encounter: Payer: Self-pay | Admitting: *Deleted

## 2013-03-22 ENCOUNTER — Encounter: Payer: Self-pay | Admitting: Cardiovascular Disease

## 2013-03-24 ENCOUNTER — Encounter: Payer: Self-pay | Admitting: Cardiovascular Disease

## 2013-03-24 ENCOUNTER — Ambulatory Visit (INDEPENDENT_AMBULATORY_CARE_PROVIDER_SITE_OTHER): Payer: BC Managed Care – PPO | Admitting: Cardiovascular Disease

## 2013-03-24 VITALS — BP 112/89 | HR 57 | Resp 16 | Ht 65.0 in | Wt 169.2 lb

## 2013-03-24 DIAGNOSIS — E119 Type 2 diabetes mellitus without complications: Secondary | ICD-10-CM

## 2013-03-24 DIAGNOSIS — E785 Hyperlipidemia, unspecified: Secondary | ICD-10-CM

## 2013-03-24 DIAGNOSIS — R609 Edema, unspecified: Secondary | ICD-10-CM

## 2013-03-24 DIAGNOSIS — I10 Essential (primary) hypertension: Secondary | ICD-10-CM

## 2013-03-24 LAB — COMPREHENSIVE METABOLIC PANEL
ALT: 13 U/L (ref 0–35)
Alkaline Phosphatase: 67 U/L (ref 39–117)
Creat: 0.87 mg/dL (ref 0.50–1.10)
Glucose, Bld: 98 mg/dL (ref 70–99)
Sodium: 138 mEq/L (ref 135–145)
Total Bilirubin: 0.6 mg/dL (ref 0.3–1.2)
Total Protein: 6.9 g/dL (ref 6.0–8.3)

## 2013-03-24 LAB — LIPID PANEL
Cholesterol: 226 mg/dL — ABNORMAL HIGH (ref 0–200)
LDL Cholesterol: 167 mg/dL — ABNORMAL HIGH (ref 0–99)
Total CHOL/HDL Ratio: 4.7 Ratio
Triglycerides: 56 mg/dL (ref <150)
VLDL: 11 mg/dL (ref 0–40)

## 2013-03-24 NOTE — Patient Instructions (Addendum)
Your physician recommends that you have lab work today Your physician recommends that you schedule a follow-up appointment in: 6 months

## 2013-03-24 NOTE — Progress Notes (Signed)
Patient ID: Stacy Horton, female   DOB: 1956/08/23, 57 y.o.   MRN: [id number]     Reason for office visit Management of coronary risk factors (hypertension, diabetes mellitus, hyperlipidemia)  No new cardiovascular events have occurred since her last appointment. She has not managed to make significant" with weight loss. She tries to be physically active but is limited by back pain. She needs to take meloxicam at least once daily, sometimes twice daily. Her diabetes mellitus is well controlled. She currently does not take any lipid-lowering therapy. She developed serious weakness and myalgias of her thighs with Lipitor and similar complaints with pravastatin. Although initially tolerant of Zetia, eventually the same side effects occurred with this medication as well.  As before she is intermittently troubled by lower showed edema, limited to the ankles and always more obvious in the right ankle than the left.    Allergies  Allergen Reactions  . Atorvastatin     REACTION: myalgias  . Ezetimibe     REACTION: myalgias    Current Outpatient Prescriptions  Medication Sig Dispense Refill  . carvedilol (COREG) 6.25 MG tablet Take 1 tablet (6.25 mg total) by mouth 2 (two) times daily with a meal.  60 tablet  0  . ferrous sulfate 325 (65 FE) MG tablet Take 325 mg by mouth daily as needed.      . Gelatin Capsules, Empty, (EMPTY CAPSULE SIZE 0 WHITE) CAPS INSERT 1 CAPSULE INTO VAGINA TWICE WEEKLY AS NEEDED AS DIRECTED  30 each  0  . HYDROcodone-acetaminophen (NORCO) 5-325 MG per tablet Take 1 tablet by mouth every 6 (six) hours as needed for pain.  30 tablet  0  . hydrocortisone (ANUSOL-HC) 25 MG suppository Place 1 suppository (25 mg total) rectally at bedtime as needed.  24 suppository  1  . lisinopril-hydrochlorothiazide (PRINZIDE,ZESTORETIC) 20-12.5 MG per tablet Take 2 tablets by mouth daily.  90 tablet  3  . meloxicam (MOBIC) 7.5 MG tablet Take 7.5 mg by mouth 2 (two) times daily as  needed for pain.      . Omega-3 Fatty Acids (FISH OIL) 1000 MG CAPS Take 3 capsules by mouth daily.        . potassium chloride SA (K-DUR,KLOR-CON) 20 MEQ tablet Take 20 mEq by mouth daily.       No current facility-administered medications for this visit.    Past Medical History  Diagnosis Date  . Diabetes mellitus   . Hypertension   . Hyperlipidemia   . Hemorrhoids   . Obesity   . Lower extremity edema     Past Surgical History  Procedure Laterality Date  . US echocardiography  09/28/2010    normal  . Nm myoview ltd  03/15/2008    no ischemia    No family history on file.  History   Social History  . Marital Status: Divorced    Spouse Name: N/A    Number of Children: N/A  . Years of Education: N/A   Occupational History  . Not on file.   Social History Main Topics  . Smoking status: Never Smoker   . Smokeless tobacco: Not on file  . Alcohol Use: Yes     Comment: occas  . Drug Use: No  . Sexually Active: Not on file   Other Topics Concern  . Not on file   Social History Narrative  . No narrative on file    Review of systems: The patient specifically denies any chest pain at rest or with  exertion, dyspnea at rest or with exertion, orthopnea, paroxysmal nocturnal dyspnea, syncope, palpitations, focal neurological deficits, intermittent claudication, unexplained weight gain, cough, hemoptysis or wheezing.  The patient also denies abdominal pain, nausea, vomiting, dysphagia, diarrhea, constipation, polyuria, polydipsia, dysuria, hematuria, frequency, urgency, abnormal bleeding or bruising, fever, chills, unexpected weight changes, mood swings, change in skin or hair texture, change in voice quality, auditory or visual problems, allergic reactions or rashes, new musculoskeletal complaints other than usual "aches and pains".   PHYSICAL EXAM BP 112/89  Pulse 57  Resp 16  Ht 5\' 5"  (1.651 m)  Wt 169 lb 3.2 oz (76.749 kg)  BMI 28.16 kg/m2  General: Alert,  oriented x3, no distress Head: no evidence of trauma, PERRL, EOMI, no exophtalmos or lid lag, no myxedema, no xanthelasma; normal ears, nose and oropharynx Neck: normal jugular venous pulsations and no hepatojugular reflux; brisk carotid pulses without delay and no carotid bruits Chest: clear to auscultation, no signs of consolidation by percussion or palpation, normal fremitus, symmetrical and full respiratory excursions Cardiovascular: normal position and quality of the apical impulse, regular rhythm, normal first and second heart sounds, no murmurs, rubs or gallops Abdomen: no tenderness or distention, no masses by palpation, no abnormal pulsatility or arterial bruits, normal bowel sounds, no hepatosplenomegaly Extremities: no clubbing, cyanosis or edema; 2+ radial, ulnar and brachial pulses bilaterally; 2+ right femoral, posterior tibial and dorsalis pedis pulses; 2+ left femoral, posterior tibial and dorsalis pedis pulses; no subclavian or femoral bruits Neurological: grossly nonfocal   EKG: Normal sinus  Lipid Panel these labs were drawn after the office visit    Component Value Date/Time   CHOL 226* 03/24/2013 0930   TRIG 56 03/24/2013 0930   HDL 48 03/24/2013 0930   CHOLHDL 4.7 03/24/2013 0930   VLDL 11 03/24/2013 0930   LDLCALC 167* 03/24/2013 0930    BMET    Component Value Date/Time   NA 138 03/24/2013 0930   K 3.6 03/24/2013 0930   CL 104 03/24/2013 0930   CO2 29 03/24/2013 0930   GLUCOSE 98 03/24/2013 0930   BUN 11 03/24/2013 0930   CREATININE 0.87 03/24/2013 0930   CREATININE 0.85 11/21/2009 2045   CALCIUM 9.7 03/24/2013 0930   GFRNONAA >60 08/19/2007 1400   GFRAA  Value: >60        The eGFR has been calculated using the MDRD equation. This calculation has not been validated in all clinical 08/19/2007 1400     ASSESSMENT AND PLAN HYPERLIPIDEMIA She has markedly elevated LDL cholesterol and is in the range for pharmacological therapy, even if she didn't have diabetes mellitus.  Since she does have diabetes, and a target LDL is less than 100 mg/dL. Unfortunately she is statin intolerant and is highly unlikely we will be able to reach his target with currently unavailable pharmacological therapy. She is also intolerant is. I have offered WelChol as therapy, but she is worried about constipation as a complication. We have agreed to recheck her lipid profile before making a decision.  DIABETES MELLITUS, TYPE II Well controlled. Last hemoglobin A1c between 6 and 7%  HYPERTENSION Well-controlled on the current regimen. ACE inhibitors are appropriate given her history of diabetes mellitus.  ANKLE EDEMA The swelling may be partly related to treatment with NSAIDs, but it sounds like she needs this to remain functional. At per minute her to be mindful of the risk of hypokalemia whenever she adds prn furosemide to her hydrochlorothiazide.  She is moderately overweight, one would be hopeful  that additional weight loss and increased physical activity might benefit her elevated LDL cholesterol.  Orders Placed This Encounter  Procedures  . Comp Met (CMET)  . Lipid Profile  . HgB A1c  . EKG 12-Lead   Meds ordered this encounter  Medications  . meloxicam (MOBIC) 7.5 MG tablet    Sig: Take 7.5 mg by mouth 2 (two) times daily as needed for pain.  . potassium chloride SA (K-DUR,KLOR-CON) 20 MEQ tablet    Sig: Take 20 mEq by mouth daily.  . ferrous sulfate 325 (65 FE) MG tablet    Sig: Take 325 mg by mouth daily as needed.    Junious Silk, MD, [facility] [phone] office [phone] pager

## 2013-03-25 ENCOUNTER — Other Ambulatory Visit: Payer: Self-pay | Admitting: *Deleted

## 2013-03-25 DIAGNOSIS — Z79899 Other long term (current) drug therapy: Secondary | ICD-10-CM

## 2013-03-25 DIAGNOSIS — E782 Mixed hyperlipidemia: Secondary | ICD-10-CM

## 2013-03-25 MED ORDER — CARVEDILOL 6.25 MG PO TABS: 6.2500 mg | ORAL_TABLET | Freq: Two times a day (BID) | ORAL | Status: DC

## 2013-03-25 MED ORDER — POTASSIUM CHLORIDE CRYS ER 20 MEQ PO TBCR: 20.0000 meq | EXTENDED_RELEASE_TABLET | Freq: Every day | ORAL | Status: DC

## 2013-03-25 NOTE — Assessment & Plan Note (Signed)
She has markedly elevated LDL cholesterol and is in the range for pharmacological therapy, even if she didn't have diabetes mellitus. Since she does have diabetes, and a target LDL is less than 100 mg/dL. Unfortunately she is statin intolerant and is highly unlikely we will be able to reach his target with currently unavailable pharmacological therapy. She is also intolerant is. I have offered WelChol as therapy, but she is worried about constipation as a complication. We have agreed to recheck her lipid profile before making a decision.

## 2013-03-25 NOTE — Assessment & Plan Note (Signed)
Well controlled. Last hemoglobin A1c between 6 and 7%

## 2013-03-25 NOTE — Assessment & Plan Note (Signed)
Well-controlled on the current regimen. ACE inhibitors are appropriate given her history of diabetes mellitus.

## 2013-03-25 NOTE — Telephone Encounter (Signed)
Lab results called to patient.  Pt will start Welchol 3 tabs bid - samples given.  Instructed to get labs repeated in 3 months.  Lab order placed.

## 2013-03-25 NOTE — Assessment & Plan Note (Signed)
The swelling may be partly related to treatment with NSAIDs, but it sounds like she needs this to remain functional. At per minute her to be mindful of the risk of hypokalemia whenever she adds prn furosemide to her hydrochlorothiazide.

## 2013-04-15 ENCOUNTER — Telehealth: Payer: Self-pay | Admitting: Cardiovascular Disease

## 2013-04-15 NOTE — Telephone Encounter (Signed)
Wanting to know the results of her lab work.. Please Call   Thanks

## 2013-04-16 NOTE — Telephone Encounter (Signed)
Results of labs given.  Counseled on sodium and fluid  Intake.  Pt voiced understanding.  Will call if she has increased edema. 

## 2013-04-16 NOTE — Telephone Encounter (Signed)
Results of labs given.  Counseled on sodium and fluid  Intake.  Pt voiced understanding.  Will call if she has increased edema.

## 2013-06-28 ENCOUNTER — Other Ambulatory Visit: Payer: Self-pay | Admitting: *Deleted

## 2013-06-28 DIAGNOSIS — Z79899 Other long term (current) drug therapy: Secondary | ICD-10-CM

## 2013-06-28 DIAGNOSIS — E782 Mixed hyperlipidemia: Secondary | ICD-10-CM

## 2013-06-30 ENCOUNTER — Ambulatory Visit: Payer: BC Managed Care – PPO | Admitting: Family Medicine

## 2013-07-08 ENCOUNTER — Ambulatory Visit (INDEPENDENT_AMBULATORY_CARE_PROVIDER_SITE_OTHER): Payer: BC Managed Care – PPO | Admitting: Sports Medicine

## 2013-07-08 ENCOUNTER — Encounter: Payer: Self-pay | Admitting: Sports Medicine

## 2013-07-08 VITALS — BP 128/84 | HR 59 | Temp 98.1°F | Ht 65.0 in | Wt 171.1 lb

## 2013-07-08 DIAGNOSIS — I872 Venous insufficiency (chronic) (peripheral): Secondary | ICD-10-CM

## 2013-07-08 DIAGNOSIS — E119 Type 2 diabetes mellitus without complications: Secondary | ICD-10-CM

## 2013-07-08 DIAGNOSIS — M549 Dorsalgia, unspecified: Secondary | ICD-10-CM

## 2013-07-08 DIAGNOSIS — E785 Hyperlipidemia, unspecified: Secondary | ICD-10-CM

## 2013-07-08 DIAGNOSIS — Z299 Encounter for prophylactic measures, unspecified: Secondary | ICD-10-CM

## 2013-07-08 DIAGNOSIS — I10 Essential (primary) hypertension: Secondary | ICD-10-CM

## 2013-07-08 LAB — POCT GLYCOSYLATED HEMOGLOBIN (HGB A1C): Hemoglobin A1C: 6.2

## 2013-07-08 MED ORDER — LISINOPRIL-HYDROCHLOROTHIAZIDE 20-12.5 MG PO TABS: 2.0000 | ORAL_TABLET | Freq: Every day | ORAL | Status: DC

## 2013-07-08 NOTE — Assessment & Plan Note (Addendum)
Taking Rolaids daily Change to Mobic prn instead of daily.  Pt declines PPI/H2

## 2013-07-08 NOTE — Progress Notes (Signed)
[facility] - 57 y.o. female MRN [id number]  Date of birth: November 12, 1955  CC, HPI, Interval History & ROS  Stacy Horton is here today to followup on her chronic medical conditions including:  HTN, HLD, DM, Hemorrhoids, Chronic Back pain   She reports overall doing well Pt denies chest pain, palpitations, shortness of breath/DOE, orthopnea/PND, LE swelling.  Patient denies any facial asymmetry, unilateral weakness, or dysarthria. Working on her diet, increasing her exercise. Good medication compliance. Has not been a tolerate any statins.  Allergies updated. History of reflux esophagitis.  Taking Rolaids on a daily basis.  Does not take any other medications.  Does take her Aleve on a daily basis. She has chronic back pain but is not performing any exercises.  Pt denies any radicular symptoms, change in bowel or bladder habits, muscle weakness or falls associated with back pain.  No fevers, chills, night sweats or weight loss.  New problem to address today is lower [place] swelling.  She has had this for quite some time.  Swelling is down at the end of the day after putting her feet up.  Equal bilaterally.  She has not tried compression socks but uses Lasix when necessary  Pertinent History  Past Medical, Surgical, Social, and Family History Reviewed. Medications and Allergies reviewed and all updated if necessary.  Objective Findings  VITALS: HR: 59 bpm  BP: 128/84 mmHg  TEMP: 98.1 F (36.7 C) (Oral)  RESP:    HT: 5\' 5"  (165.1 cm)  WT: 171 lb 1.6 oz (77.61 kg)  BMI: 28.5   BP Readings from Last 3 Encounters:  07/08/13 128/84  03/24/13 112/89  06/20/12 126/72   Wt Readings from Last 3 Encounters:  07/08/13 171 lb 1.6 oz (77.61 kg)  03/24/13 169 lb 3.2 oz (76.749 kg)  06/20/12 177 lb (80.287 kg)     PHYSICAL EXAM: GENERAL:  adult AA female. In no discomfort; no respiratory distress  PSYCH: alert and appropriate, good insight   HNEENT:  no  JVD, no thyromegaly   CARDIO: RRR, S1/S2 heard, no murmur  LUNGS: CTA B, no wheezes, no crackles  ABDOMEN:   EXTREM:  Warm, well perfused.  Moves all 4 extremities spontaneously; no lateralization.  No noted foot lesions.  Distal pulses 2+/4.  2+ pretibial edema bilateral   GU:   SKIN:     Assessment & Plan   Problems addressed today: General Plan & Pt Instructions:  1. DIABETES MELLITUS, TYPE II   2. Essential hypertension, benign   3. Reflux esophagitis   4. HYPERTENSION   5. Chronic venous insufficiency   6. Preventive measure   7. Back pain   8. HYPERLIPIDEMIA       Look into Red Yeast Rice and see if you tolerate it  Cut back on Mobic to as needed  Decrease potassium to just when taking lasix  Back exercises  Compression stockings       For further discussion of A/P and for follow up issues see problem based charting.

## 2013-07-08 NOTE — Assessment & Plan Note (Addendum)
Flu shot today Given diet controlled and GERD, not on ASA but should reconsider in future

## 2013-07-08 NOTE — Assessment & Plan Note (Addendum)
Stable/Well Controlled - no changes at this time. Lisinopril refilled

## 2013-07-08 NOTE — Patient Instructions (Signed)
   Look into Red Yeast Rice and see if you tolerate it  Cut back on Mobic to as needed  Decrease potassium to just when taking lasix  Back exercises  Compression stockings     If you need anything prior to your next visit please call the clinic. Please Bring all medications or accurate medication list with you to each appointment; an accurate medication list is essential in providing you the best care possible.

## 2013-07-08 NOTE — Assessment & Plan Note (Addendum)
Rx for compression stockings today Continue Lasix prn.  Okay to cut back potassium when using lasix

## 2013-07-08 NOTE — Assessment & Plan Note (Signed)
DIET CONTROLLED - Stable/Well Controlled - no changes at this time.

## 2013-07-08 NOTE — Assessment & Plan Note (Signed)
Recommend patient try red rice yeast has had some stat and benefits with usual better tolerance.   Defer further evaluation to PCP

## 2013-07-08 NOTE — Assessment & Plan Note (Signed)
Chronic Back pain - encouraged activity - Back hand out given

## 2013-07-14 ENCOUNTER — Other Ambulatory Visit: Payer: Self-pay

## 2013-07-14 DIAGNOSIS — Z1231 Encounter for screening mammogram for malignant neoplasm of breast: Secondary | ICD-10-CM

## 2013-08-13 ENCOUNTER — Ambulatory Visit
Admission: RE | Admit: 2013-08-13 | Discharge: 2013-08-13 | Disposition: A | Discharge status: Home / Self Care | Payer: BC Managed Care – PPO | Source: Ambulatory Visit

## 2013-08-13 DIAGNOSIS — Z1231 Encounter for screening mammogram for malignant neoplasm of breast: Secondary | ICD-10-CM

## 2013-09-21 ENCOUNTER — Ambulatory Visit: Payer: BC Managed Care – PPO | Admitting: Cardiovascular Disease

## 2013-09-28 ENCOUNTER — Ambulatory Visit: Payer: BC Managed Care – PPO | Admitting: Cardiovascular Disease

## 2013-10-05 ENCOUNTER — Ambulatory Visit (INDEPENDENT_AMBULATORY_CARE_PROVIDER_SITE_OTHER): Payer: BC Managed Care – PPO | Admitting: Cardiovascular Disease

## 2013-10-05 ENCOUNTER — Encounter: Payer: Self-pay | Admitting: Cardiovascular Disease

## 2013-10-05 VITALS — BP 130/80 | HR 56 | Resp 16 | Ht 65.0 in | Wt 175.7 lb

## 2013-10-05 DIAGNOSIS — E785 Hyperlipidemia, unspecified: Secondary | ICD-10-CM

## 2013-10-05 DIAGNOSIS — Z79899 Other long term (current) drug therapy: Secondary | ICD-10-CM

## 2013-10-05 DIAGNOSIS — I10 Essential (primary) hypertension: Secondary | ICD-10-CM

## 2013-10-05 MED ORDER — ROSUVASTATIN CALCIUM 5 MG PO TABS: 5.0000 mg | ORAL_TABLET | ORAL | Status: DC

## 2013-10-05 NOTE — Patient Instructions (Signed)
START Crestor 5mg  - take one tablet once a week.  Has FASTING Lab work done in 3 months at [facility] on the first floor.  Your physician recommends that you schedule a follow-up appointment in: 6 months.

## 2013-10-10 NOTE — Assessment & Plan Note (Addendum)
Unfortunately she is intolerant to statins as well as Zetia. There is still room to improve her metabolic profile by losing weight, improving her diet and exercising. We discussed this at length today. Ideally her LDL cholesterol be less than 100 mg/dL, less than 161130 could be considered acceptable since she does not have structural heart disease or vascular problems, especially if she "cures" her diabetes by losing additional weight..Marland Kitchen

## 2013-10-10 NOTE — Assessment & Plan Note (Signed)
Well controlled 

## 2013-10-10 NOTE — Progress Notes (Signed)
Patient ID: Stacy Horton, female   DOB: 02-10-1956, 58 y.o.   MRN: [id number]      Reason for office visit Hypertension, hyperlipidemia  Stacy Horton is a 58 year old woman with borderline obesity and multiple comorbidities including hypertension, diet-controlled diabetes mellitus and hyperlipidemia (primarily elevated LDL cholesterol). Unfortunately she is intolerant to numerous statins (she has tried both lipo and hydrosoulble statins) and Zetia, all of which have caused intolerable myalgia. She feels well. She is rarely troubled by ankle edema and denies chest pain dyspnea dizziness syncope and palpitations. She weighs only slightly less than at her appointment a year ago and her BMI remains borderline obese at 29. She had a normal nuclear stress test in 2009 and has normal left ventricular systolic function by both echo and scintigraphy. She does not have a history of stroke or peripheral arterial disease. Her hemoglobin A1c is 6.2% without any hypoglycemic pharmacological agents.   Allergies  Allergen Reactions  . Ezetimibe     REACTION: myalgias  . Statins     Tried, Atorvastatin, Crestor, Pravastatin, Simvastatin = all caused same problem    Current Outpatient Prescriptions  Medication Sig Dispense Refill  . carvedilol (COREG) 6.25 MG tablet Take 1 tablet (6.25 mg total) by mouth 2 (two) times daily with a meal.  180 tablet  3  . hydrocortisone (ANUSOL-HC) 25 MG suppository Place 1 suppository (25 mg total) rectally at bedtime as needed.  24 suppository  1  . lisinopril-hydrochlorothiazide (PRINZIDE,ZESTORETIC) 20-12.5 MG per tablet Take 2 tablets by mouth daily.  90 tablet  3  . meloxicam (MOBIC) 7.5 MG tablet Take 7.5 mg by mouth 2 (two) times daily as needed for pain.      . rosuvastatin (CRESTOR) 5 MG tablet Take 1 tablet (5 mg total) by mouth once a week.  28 tablet  0   No current facility-administered medications for this visit.    Past Medical History    Diagnosis Date  . Diabetes mellitus   . Hypertension   . Hyperlipidemia   . Hemorrhoids   . Obesity   . Lower extremity edema     Past Surgical History  Procedure Laterality Date  . US echocardiography  09/28/2010    normal  . Nm myoview ltd  03/15/2008    no ischemia    No family history on file.  History   Social History  . Marital Status: Divorced    Spouse Name: N/A    Number of Children: N/A  . Years of Education: N/A   Occupational History  . Not on file.   Social History Main Topics  . Smoking status: Never Smoker   . Smokeless tobacco: Not on file  . Alcohol Use: Yes     Comment: occas  . Drug Use: No  . Sexual Activity: Not on file   Other Topics Concern  . Not on file   Social History Narrative  . No narrative on file    Review of systems: The patient specifically denies any chest pain at rest or with exertion, dyspnea at rest or with exertion, orthopnea, paroxysmal nocturnal dyspnea, syncope, palpitations, focal neurological deficits, intermittent claudication, unexplained weight gain, cough, hemoptysis or wheezing.  The patient also denies abdominal pain, nausea, vomiting, dysphagia, diarrhea, constipation, polyuria, polydipsia, dysuria, hematuria, frequency, urgency, abnormal bleeding or bruising, fever, chills, unexpected weight changes, mood swings, change in skin or hair texture, change in voice quality, auditory or visual problems, allergic reactions or rashes, new musculoskeletal complaints other  than usual "aches and pains".   PHYSICAL EXAM BP 130/80  Pulse 56  Resp 16  Ht _0  (1.651 m)  Wt 175 lb 11.2 oz (79.697 kg)  BMI 29.24 kg/m2  General: Alert, oriented x3, no distress Head: no evidence of trauma, PERRL, EOMI, no exophtalmos or lid lag, no myxedema, no xanthelasma; normal ears, nose and oropharynx Neck: normal jugular venous pulsations and no hepatojugular reflux; brisk carotid pulses without delay and no carotid bruits Chest:  clear to auscultation, no signs of consolidation by percussion or palpation, normal fremitus, symmetrical and full respiratory excursions Cardiovascular: normal position and quality of the apical impulse, regular rhythm, normal first and second heart sounds, no murmurs, rubs or gallops Abdomen: no tenderness or distention, no masses by palpation, no abnormal pulsatility or arterial bruits, normal bowel sounds, no hepatosplenomegaly Extremities: no clubbing, cyanosis or edema; 2+ radial, ulnar and brachial pulses bilaterally; 2+ right femoral, posterior tibial and dorsalis pedis pulses; 2+ left femoral, posterior tibial and dorsalis pedis pulses; no subclavian or femoral bruits Neurological: grossly nonfocal   EKG: Sinus bradycardia, delayed R-wave progression anteriorly, otherwise normal, no change  Lipid Panel     Component Value Date/Time   CHOL 226* 03/24/2013 0930   TRIG 56 03/24/2013 0930   HDL 48 03/24/2013 0930   CHOLHDL 4.7 03/24/2013 0930   VLDL 11 03/24/2013 0930   LDLCALC 167* 03/24/2013 0930    BMET    Component Value Date/Time   NA 138 03/24/2013 0930   K 3.6 03/24/2013 0930   CL 104 03/24/2013 0930   CO2 29 03/24/2013 0930   GLUCOSE 98 03/24/2013 0930   BUN 11 03/24/2013 0930   CREATININE 0.87 03/24/2013 0930   CREATININE 0.85 11/21/2009 2045   CALCIUM 9.7 03/24/2013 0930   GFRNONAA >60 08/19/2007 1400   GFRAA  Value: >60        The eGFR has been calculated using the MDRD equation. This calculation has not been validated in all clinical 08/19/2007 1400     ASSESSMENT AND PLAN HYPERLIPIDEMIA Unfortunately she is intolerant to statins as well as Zetia. There is still room to improve her metabolic profile by losing weight, improving her diet and exercising. We discussed this at length today. Ideally her LDL cholesterol be less than 100 mg/dL, less than 130 could be considered acceptable since she does not have structural heart disease or vascular problems, especially if she "cures"  her diabetes by losing additional weight.Marland Kitchen  HYPERTENSION Well-controlled.  Orders Placed This Encounter  Procedures  . Lipid Profile  . Comprehensive metabolic panel  . EKG 12-Lead   Meds ordered this encounter  Medications  . rosuvastatin (CRESTOR) 5 MG tablet    Sig: Take 1 tablet (5 mg total) by mouth once a week.    Dispense:  28 tablet    Refill:  0    Stacy Horton  Sanda Klein, MD, [facility] [phone] office [phone] pager

## 2013-10-11 ENCOUNTER — Encounter: Payer: Self-pay | Admitting: Cardiovascular Disease

## 2014-03-30 ENCOUNTER — Other Ambulatory Visit: Payer: Self-pay | Admitting: Cardiovascular Disease

## 2014-03-30 NOTE — Telephone Encounter (Signed)
Rx was sent to pharmacy electronically. 

## 2014-04-22 ENCOUNTER — Encounter: Payer: Self-pay | Admitting: Cardiovascular Disease

## 2014-04-22 ENCOUNTER — Ambulatory Visit (INDEPENDENT_AMBULATORY_CARE_PROVIDER_SITE_OTHER): Payer: BC Managed Care – PPO | Admitting: Cardiovascular Disease

## 2014-04-22 VITALS — BP 134/88 | HR 66 | Resp 16 | Ht 65.0 in | Wt 172.8 lb

## 2014-04-22 DIAGNOSIS — E782 Mixed hyperlipidemia: Secondary | ICD-10-CM

## 2014-04-22 DIAGNOSIS — E785 Hyperlipidemia, unspecified: Secondary | ICD-10-CM

## 2014-04-22 DIAGNOSIS — I10 Essential (primary) hypertension: Secondary | ICD-10-CM

## 2014-04-22 MED ORDER — CARVEDILOL 6.25 MG PO TABS: 6.2500 mg | ORAL_TABLET | Freq: Two times a day (BID) | ORAL | Status: DC

## 2014-04-22 MED ORDER — LISINOPRIL-HYDROCHLOROTHIAZIDE 20-12.5 MG PO TABS: 2.0000 | ORAL_TABLET | Freq: Every day | ORAL | Status: DC

## 2014-04-22 NOTE — Progress Notes (Signed)
Patient ID: Stacy Horton, female   DOB: May 05, 1956, 58 y.o.   MRN: [id number]     Reason for office visit Management of coronary risk factors (hypertension, diabetes mellitus, hyperlipidemia)   Mrs. Jacqualin Combes is a 59 year old woman with hypertension diabetes mellitus and hyperlipidemia but without known structural heart disease. She has been intolerant of multiple lipid lowering medications including both water-soluble and lipid soluble statins and Zetia.  Her major complaints remain those related to back pain. She intermittently takes nonsteroidal anti-inflammatory drugs, although she no longer takes a long-acting meloxicam but rather ibuprofen. As before she complains of intermittent problems with lower extremity edema generally more obvious in the summer and more severe in the right ankle than the left. Previous evaluations have shown no evidence of venous insufficiency or proteinuria. She remains moderately overweight.   Allergies  Allergen Reactions  . Ezetimibe     REACTION: myalgias  . Statins     Tried, Atorvastatin, Crestor, Pravastatin, Simvastatin = all caused same problem    Current Outpatient Prescriptions  Medication Sig Dispense Refill  . carvedilol (COREG) 6.25 MG tablet Take 1 tablet (6.25 mg total) by mouth 2 (two) times daily with a meal.  180 tablet  3  . hydrocortisone (ANUSOL-HC) 25 MG suppository Place 1 suppository (25 mg total) rectally at bedtime as needed.  24 suppository  1  . lisinopril-hydrochlorothiazide (PRINZIDE,ZESTORETIC) 20-12.5 MG per tablet Take 2 tablets by mouth daily.  90 tablet  3   No current facility-administered medications for this visit.    Past Medical History  Diagnosis Date  . Diabetes mellitus   . Hypertension   . Hyperlipidemia   . Hemorrhoids   . Obesity   . Lower extremity edema     Past Surgical History  Procedure Laterality Date  . US echocardiography  09/28/2010    normal  . Nm myoview ltd  03/15/2008    no  ischemia    No family history on file.  History   Social History  . Marital Status: Divorced    Spouse Name: N/A    Number of Children: N/A  . Years of Education: N/A   Occupational History  . Not on file.   Social History Main Topics  . Smoking status: Never Smoker   . Smokeless tobacco: Never Used  . Alcohol Use: Yes     Comment: occas  . Drug Use: No  . Sexual Activity: Not on file   Other Topics Concern  . Not on file   Social History Narrative  . No narrative on file    Review of systems: The patient specifically denies any chest pain at rest or with exertion, dyspnea at rest or with exertion, orthopnea, paroxysmal nocturnal dyspnea, syncope, palpitations, focal neurological deficits, intermittent claudication, unexplained weight gain, cough, hemoptysis or wheezing.  The patient also denies abdominal pain, nausea, vomiting, dysphagia, diarrhea, constipation, polyuria, polydipsia, dysuria, hematuria, frequency, urgency, abnormal bleeding or bruising, fever, chills, unexpected weight changes, mood swings, change in skin or hair texture, change in voice quality, auditory or visual problems, allergic reactions or rashes.  PHYSICAL EXAM BP 134/88  Pulse 66  Resp 16  Ht 5' 5"  (1.651 m)  Wt 172 lb 12.8 oz (78.382 kg)  BMI 28.76 kg/m2  General: Alert, oriented x3, no distress Head: no evidence of trauma, PERRL, EOMI, no exophtalmos or lid lag, no myxedema, no xanthelasma; normal ears, nose and oropharynx Neck: normal jugular venous pulsations and no hepatojugular reflux; brisk carotid pulses without delay  and no carotid bruits Chest: clear to auscultation, no signs of consolidation by percussion or palpation, normal fremitus, symmetrical and full respiratory excursions Cardiovascular: normal position and quality of the apical impulse, regular rhythm, normal first and second heart sounds, no murmurs, rubs or gallops Abdomen: no tenderness or distention, no masses by  palpation, no abnormal pulsatility or arterial bruits, normal bowel sounds, no hepatosplenomegaly Extremities: no clubbing, cyanosis; 2+edema; 2+ radial, ulnar and brachial pulses bilaterally; 2+ right femoral, posterior tibial and dorsalis pedis pulses; 2+ left femoral, posterior tibial and dorsalis pedis pulses; no subclavian or femoral bruits Neurological: grossly nonfocal   EKG: NSR  Lipid Panel     Component Value Date/Time   CHOL 226* 03/24/2013 0930   TRIG 56 03/24/2013 0930   HDL 48 03/24/2013 0930   CHOLHDL 4.7 03/24/2013 0930   VLDL 11 03/24/2013 0930   LDLCALC 167* 03/24/2013 0930    BMET    Component Value Date/Time   NA 138 03/24/2013 0930   K 3.6 03/24/2013 0930   CL 104 03/24/2013 0930   CO2 29 03/24/2013 0930   GLUCOSE 98 03/24/2013 0930   BUN 11 03/24/2013 0930   CREATININE 0.87 03/24/2013 0930   CREATININE 0.85 11/21/2009 2045   CALCIUM 9.7 03/24/2013 0930   GFRNONAA >60 08/19/2007 1400   GFRAA  Value: >60        The eGFR has been calculated using the MDRD equation. This calculation has not been validated in all clinical 08/19/2007 1400     ASSESSMENT AND PLAN No problem-specific assessment & plan notes found for this encounter. HYPERLIPIDEMIA  She has markedly elevated LDL cholesterol and is in the range for pharmacological therapy, even if she didn't have diabetes mellitus. Since she does have diabetes, and a target LDL is less than 100 mg/dL. Unfortunately she is statin intolerant and is highly unlikely we will be able to reach his target with currently unavailable pharmacological therapy. May be a candidate for PSCK9 inhibitors when available. DIABETES MELLITUS, TYPE II  Well controlled.   HYPERTENSION  Well-controlled on the current regimen. ACE inhibitors are appropriate given her history of diabetes mellitus.  ANKLE EDEMA  The swelling may be partly related to treatment with NSAIDs, but it sounds like she needs these to remain functional. No evidence of DVT or  proteinuria by previous workup.  She is moderately overweight, one would be hopeful that additional weight loss and increased physical activity might benefit her elevated LDL cholesterol.  Patient Instructions  Your physician recommends that you return for lab work fasting.  Your physician recommends that you schedule a follow-up appointment in: 1 year. No changes were made today in your therapy.    Orders Placed This Encounter  Procedures  . Comprehensive metabolic panel  . Lipid panel  . EKG 12-Lead   Meds ordered this encounter  Medications  . lisinopril-hydrochlorothiazide (PRINZIDE,ZESTORETIC) 20-12.5 MG per tablet    Sig: Take 2 tablets by mouth daily.    Dispense:  90 tablet    Refill:  3  . carvedilol (COREG) 6.25 MG tablet    Sig: Take 1 tablet (6.25 mg total) by mouth 2 (two) times daily with a meal.    Dispense:  180 tablet    Refill:  Wildwood Tenise Stetler, MD, [place] [phone] office [phone] pager

## 2014-04-22 NOTE — Patient Instructions (Signed)
Your physician recommends that you return for lab work fasting.   Your physician recommends that you schedule a follow-up appointment in: 1 year. No changes were made today in your therapy. 

## 2014-04-26 LAB — LIPID PANEL
Cholesterol: 229 mg/dL — ABNORMAL HIGH (ref 0–200)
HDL: 47 mg/dL (ref >39)
LDL CALC: 161 mg/dL — AB (ref 0–99)
Total CHOL/HDL Ratio: 4.9 Ratio
Triglycerides: 104 mg/dL (ref <150)
VLDL: 21 mg/dL (ref 0–40)

## 2014-04-26 LAB — COMPREHENSIVE METABOLIC PANEL
ALK PHOS: 59 U/L (ref 39–117)
ALT: 16 U/L (ref 0–35)
AST: 19 U/L (ref 0–37)
Albumin: 3.9 g/dL (ref 3.5–5.2)
BILIRUBIN TOTAL: 0.8 mg/dL (ref 0.2–1.2)
BUN: 9 mg/dL (ref 6–23)
CO2: 28 mEq/L (ref 19–32)
Calcium: 9.6 mg/dL (ref 8.4–10.5)
Chloride: 103 mEq/L (ref 96–112)
Creat: 0.78 mg/dL (ref 0.50–1.10)
Glucose, Bld: 115 mg/dL — ABNORMAL HIGH (ref 70–99)
Potassium: 4 mEq/L (ref 3.5–5.3)
Sodium: 139 mEq/L (ref 135–145)
TOTAL PROTEIN: 6.8 g/dL (ref 6.0–8.3)

## 2014-04-28 ENCOUNTER — Encounter: Payer: Self-pay | Admitting: *Deleted

## 2014-04-28 ENCOUNTER — Encounter: Payer: Self-pay | Admitting: Family Medicine

## 2014-04-28 ENCOUNTER — Telehealth: Payer: Self-pay | Admitting: *Deleted

## 2014-04-28 NOTE — Telephone Encounter (Signed)
Spoke to patient. Result given . Verbalized understanding Request copy-mailed to patient

## 2014-04-28 NOTE — Telephone Encounter (Signed)
Message copied by Tobin ChadMARTIN, SHARON V. on Thu Apr 28, 2014  2:48 PM ------      Message from: Thurmon FairROITORU, MIHAI      Created: Wed Apr 27, 2014 10:43 AM       Cholesterol remains really high. Please tell her she will probably not "qualify" for the new cholesterol lowering meds at this point - this will likely change in the future ------

## 2014-05-12 ENCOUNTER — Ambulatory Visit: Payer: BC Managed Care – PPO | Admitting: Family Medicine

## 2014-07-05 ENCOUNTER — Ambulatory Visit (INDEPENDENT_AMBULATORY_CARE_PROVIDER_SITE_OTHER): Payer: BC Managed Care – PPO | Admitting: Family Medicine

## 2014-07-05 ENCOUNTER — Encounter: Payer: Self-pay | Admitting: Family Medicine

## 2014-07-05 VITALS — BP 161/99 | HR 79 | Temp 98.6°F | Ht 65.0 in | Wt 172.0 lb

## 2014-07-05 DIAGNOSIS — I10 Essential (primary) hypertension: Secondary | ICD-10-CM | POA: Diagnosis not present

## 2014-07-05 DIAGNOSIS — K649 Unspecified hemorrhoids: Secondary | ICD-10-CM | POA: Diagnosis not present

## 2014-07-05 DIAGNOSIS — Z418 Encounter for other procedures for purposes other than remedying health state: Secondary | ICD-10-CM | POA: Diagnosis not present

## 2014-07-05 DIAGNOSIS — E119 Type 2 diabetes mellitus without complications: Secondary | ICD-10-CM | POA: Diagnosis not present

## 2014-07-05 DIAGNOSIS — Z299 Encounter for prophylactic measures, unspecified: Secondary | ICD-10-CM

## 2014-07-05 LAB — POCT GLYCOSYLATED HEMOGLOBIN (HGB A1C): HEMOGLOBIN A1C: 6.1

## 2014-07-05 LAB — TSH: TSH: 1.079 u[IU]/mL (ref 0.350–4.500)

## 2014-07-05 MED ORDER — NEBIVOLOL HCL 10 MG PO TABS: 10.0000 mg | ORAL_TABLET | Freq: Every day | ORAL | Status: DC

## 2014-07-05 MED ORDER — OMEPRAZOLE 20 MG PO CPDR: 20.0000 mg | DELAYED_RELEASE_CAPSULE | Freq: Every day | ORAL | Status: DC

## 2014-07-05 NOTE — Patient Instructions (Addendum)
Thank you so much for coming to see me today! I have printed out a prescription for Bystolic and Omeprazole for you.  If they are too expensive, please let me know and we can try to figure something else out!  [facility]. Your constipation medicine is expensive and has no generic alternative. I recommend increasing your fiber, fruit, and fluid intake.  You may pick up Anusol over the counter for your hemorrhoids.   Please follow up with your gynecologist for your pap smear and don't forget your Mammogram in November.  Thanks again for coming to see me! Dr. Caroleen Hammanumley  Hepatitis B Vaccine, Recombinant injection What is this medicine? HEPATITIS B VACCINE (hep uh TAHY tis B VAK seen) is a vaccine. It is used to prevent an infection with the hepatitis B virus. This medicine may be used for other purposes; ask your health care provider or pharmacist if you have questions. COMMON BRAND NAME(S): Engerix-B, Recombivax HB What should I tell my health care provider before I take this medicine? They need to know if you have any of these conditions: -fever, infection -heart disease -hepatitis B infection -immune system problems -kidney disease -an unusual or allergic reaction to vaccines, yeast, other medicines, foods, dyes, or preservatives -pregnant or trying to get pregnant -breast-feeding How should I use this medicine? This vaccine is for injection into a muscle. It is given by a health care professional. A copy of Vaccine Information Statements will be given before each vaccination. Read this sheet carefully each time. The sheet may change frequently. Talk to your pediatrician regarding the use of this medicine in children. While this drug may be prescribed for children as young as newborn for selected conditions, precautions do apply. Overdosage: If you think you have taken too much of this medicine contact a poison control center or emergency room at  once. NOTE: This medicine is only for you. Do not share this medicine with others. What if I miss a dose? It is important not to miss your dose. Call your doctor or health care professional if you are unable to keep an appointment. What may interact with this medicine? -medicines that suppress your immune function like adalimumab, anakinra, infliximab -medicines to treat cancer -steroid medicines like prednisone or cortisone This list may not describe all possible interactions. Give your health care provider a list of all the medicines, herbs, non-prescription drugs, or dietary supplements you use. Also tell them if you smoke, drink alcohol, or use illegal drugs. Some items may interact with your medicine. What should I watch for while using this medicine? See your health care provider for all shots of this vaccine as directed. You must have 3 shots of this vaccine for protection from hepatitis B infection. Tell your doctor right away if you have any serious or unusual side effects after getting this vaccine. What side effects may I notice from receiving this medicine? Side effects that you should report to your doctor or health care professional as soon as possible: -allergic reactions like skin rash, itching or hives, swelling of the face, lips, or tongue -breathing problems -confused, irritated -fast, irregular heartbeat -flu-like syndrome -numb, tingling pain -seizures -unusually weak or tired Side effects that usually do not require medical attention (report to your doctor or health care professional if they continue or are bothersome): -diarrhea -fever -headache -loss of appetite -muscle pain -nausea -pain, redness, swelling, or irritation at site where injected -tiredness This list may not describe  all possible side effects. Call your doctor for medical advice about side effects. You may report side effects to FDA at 1-800-FDA-1088. Where should I keep my medicine? This drug is  given in a hospital or clinic and will not be stored at home. NOTE: This sheet is a summary. It may not cover all possible information. If you have questions about this medicine, talk to your doctor, pharmacist, or health care provider.  2015, Elsevier/Gold Standard. (2014-01-17 13:26:01)

## 2014-07-05 NOTE — Assessment & Plan Note (Signed)
-   A1C checked today and was 6.1. - Foot exam done today. - Follow up ophthalmology exam at next visit.

## 2014-07-05 NOTE — Assessment & Plan Note (Signed)
-   BP elevated today at 158/100 and 161/99 - She has forgotten her medication for the past two days - States that BP is normal when on her medication - Given prescription for Bystolic 10mg  so she can check the cost at her pharmacy  -Instructed to call if Bystolic is too expensive and we can refill her Carvedilol instead.  -Consider an alternative once a day medication at next visit - Continue Lisinopril-HCTZ.  - TSH checked today - Discussed the importance of her BP medication and not missing doses - Reevalute BP at next visit while on BP medications

## 2014-07-05 NOTE — Progress Notes (Signed)
Subjective:     Patient ID: Stacy MatasSheilah A Horton, female   DOB: 04/16/1956, 58 y.o.   MRN: [id number]  HPI Stacy Horton is a 58yo female presenting today for checkup.  # Hypertension - BP elevated today at 158/100. Recheck showed BP of 161/99 - States that she has forgotten to take her BP for two days. - Currently on Lisinopril-HCTZ and Carvedilol - States she used to take Bystolic 10mg  and it worked better for her since it was once a day.  She often forgets to take her Carvedilol since it is twice a day - Would like prescription for Bystolic so she can see if it is more affordable now  # Diabetes - Controlled by diet - Last A1C one year ago  # GI: Sees Dr. Loreta AveMann for constipation and Hemorrhoids - Was given prescriptions for Anusol, Linzess, and Nexium but was unable to afford them - Would like prescriptions for these today to see if she is able to afford them  # Health Maintenance - Colonoscopy in 2010 - Last mammogram in November 2014 - Last pap smear recorded in 2005, however Stacy Horton states this occurred three years ago. States she will check with her Ob/Gyn about getting one done. - Lipid panel and CMP checked in July by Cardiology  Review of Systems  Gastrointestinal: Positive for constipation.  Musculoskeletal:       Lower Extremity Edema   All other systems reviewed and are negative.      Objective:   Physical Exam  Constitutional: She is oriented to person, place, and time. She appears well-developed and well-nourished. No distress.  HENT:  Head: Normocephalic and atraumatic.  Cardiovascular: Normal rate, regular rhythm and normal heart sounds.  Exam reveals no gallop and no friction rub.   No murmur heard. Pulmonary/Chest: Effort normal and breath sounds normal. No respiratory distress. She has no wheezes. She has no rales.  Abdominal: Soft. Bowel sounds are normal. She exhibits no distension and no mass. There is no tenderness. There is no rebound and no  guarding.  Musculoskeletal: Normal range of motion. She exhibits edema.  Foot exam showed no sensation loss, no ulcers or sores, and palpable pulse bilaterally  Neurological: She is alert and oriented to person, place, and time.  Skin: Skin is warm and dry. No rash noted.  Psychiatric: She has a normal mood and affect. Her behavior is normal.       Assessment:     Please refer to Problem List for Assessment.     Plan:     Please refer to Problem List for Plan.

## 2014-07-05 NOTE — Assessment & Plan Note (Addendum)
-   Instructed to pick up Anusol over the counter - Linzess is still expensive and there is no generic alternative.  Instructed to increase her Fiber, Fruit, and Fluid intake. - Prescription for Omeprazole 20mg  given as an alternative for Nexium.  Instructed to see how expensive it is at her pharmacy, but [facility]. - Followed by Dr. Loreta AveMann

## 2014-07-05 NOTE — Assessment & Plan Note (Signed)
-   Agrees to mammogram screening in November - Will discuss pap smear with Ob/Gyn

## 2014-07-12 ENCOUNTER — Telehealth: Payer: Self-pay | Admitting: Family Medicine

## 2014-07-12 MED ORDER — OMEPRAZOLE 20 MG PO CPDR: 20.0000 mg | DELAYED_RELEASE_CAPSULE | Freq: Every day | ORAL | Status: DC

## 2014-07-12 MED ORDER — NEBIVOLOL HCL 10 MG PO TABS: 10.0000 mg | ORAL_TABLET | Freq: Every day | ORAL | Status: DC

## 2014-07-12 NOTE — Telephone Encounter (Signed)
[facility] [address]

## 2014-07-12 NOTE — Telephone Encounter (Signed)
Patient has lost the written rx's given to her

## 2014-07-12 NOTE — Telephone Encounter (Signed)
Pt seen last week by Dr. Caroleen Hammanumley. States she has lost the 2 RX that she was given (omeprazole & nebivolol). Pt is asking if this can be called in to the [facility] on [address]. Also, she would like to reminf Dr. Caroleen Hammanumley that if the nebivolol Is too expensive that she will need a RX for carvedilol. Pls advise.

## 2014-07-13 NOTE — Telephone Encounter (Signed)
LVM for patient to call back to inform her of below 

## 2014-07-13 NOTE — Telephone Encounter (Signed)
LVM informing of below 

## 2014-07-27 ENCOUNTER — Other Ambulatory Visit: Payer: Self-pay

## 2014-07-28 ENCOUNTER — Other Ambulatory Visit: Payer: Self-pay

## 2014-07-28 DIAGNOSIS — Z1231 Encounter for screening mammogram for malignant neoplasm of breast: Secondary | ICD-10-CM

## 2014-08-09 ENCOUNTER — Telehealth: Payer: Self-pay | Admitting: Cardiovascular Disease

## 2014-08-09 NOTE — Telephone Encounter (Signed)
Left message to call back  

## 2014-08-09 NOTE — Telephone Encounter (Signed)
Spoke to patient She states she has had for a couple days - left shoulder pain radiating arm and fingers  And uder arms- constant No chest pain , has indigestion- burping (ROLAIDS helps)  ,no nausea, no shortness of breathe,  Patient has not contacted primary - appt made for tomorrow 08/10/14 2:30 pm per patient request

## 2014-08-09 NOTE — Telephone Encounter (Signed)
Returning your call. °

## 2014-08-09 NOTE — Telephone Encounter (Signed)
Stacy Horton is stating that she is having pains in her (L) arm and shoulder going down her to the fingers and to her back . Would like to come in today to see someone . Please call ..Marland Kitchen

## 2014-08-10 ENCOUNTER — Encounter: Payer: Self-pay | Admitting: Physician Assistant

## 2014-08-10 ENCOUNTER — Ambulatory Visit (INDEPENDENT_AMBULATORY_CARE_PROVIDER_SITE_OTHER): Payer: BC Managed Care – PPO | Admitting: Physician Assistant

## 2014-08-10 VITALS — BP 144/88 | HR 87 | Ht 65.0 in | Wt 174.3 lb

## 2014-08-10 DIAGNOSIS — M79602 Pain in left arm: Secondary | ICD-10-CM

## 2014-08-10 NOTE — Assessment & Plan Note (Signed)
Patient's symptoms of left arm pain are musculoskeletal in nature. It is reproducible with passive and active motion as well as palpation along the trapezius.  Recommended continued use of Aleve as needed. She uses already for her back and it provides the most relief. She is already noticed some improvement since the initial onset 1 week ago anyhow.

## 2014-08-10 NOTE — Progress Notes (Signed)
Date:  08/10/2014   ID:  Stacy MatasSheilah A Allmendinger, DOB 03/08/1956, MRN [id number]  PCP:  Araceli Boucheumley, [place], DO  Primary Cardiologist:  Croitoru     History of Present Illness: Stacy Horton is a 58 y.o. female with hypertension diabetes mellitus and hyperlipidemia but without known structural heart disease. She has been intolerant of multiple lipid lowering medications including both water-soluble and lipid soluble statins and Zetia.  She last saw Dr. Royann Shiversroitoru in July 2015.    Her previous visit her major complaint was related to back pain. She intermittently takes nonsteroidal anti-inflammatory drugs, although she no longer takes a long-acting meloxicam but rather ibuprofen. She also complained of intermittent problems with lower extremity edema generally more obvious in the summer and more severe in the right ankle than the left. Previous evaluations have shown no evidence of venous insufficiency or proteinuria. She remains moderately overweight.  She presents today with complaints of pain,numbness and tingling down her left arm. She denies any shortness of breath or chest pain. It started approximately 1 week ago and is worse with movement of her left arm. She thinks maybe it occurred after sleeping on her left side. It is also more uncomfortable when she does lay on the left side.  The patient currently denies nausea, vomiting, fever, orthopnea, dizziness, PND, cough, congestion, abdominal pain, hematochezia, melena, lower extremity edema, claudication.  Lipid Panel     Component Value Date/Time   CHOL 229* 04/26/2014 1035   TRIG 104 04/26/2014 1035   HDL 47 04/26/2014 1035   CHOLHDL 4.9 04/26/2014 1035   VLDL 21 04/26/2014 1035   LDLCALC 161* 04/26/2014 1035   LDLDIRECT 129* 12/30/2008 1930      Wt Readings from Last 3 Encounters:  08/10/14 174 lb 4.8 oz (79.062 kg)  07/05/14 172 lb (78.019 kg)  04/22/14 172 lb 12.8 oz (78.382 kg)     Past Medical History  Diagnosis Date   . Diabetes mellitus   . Hypertension   . Hyperlipidemia   . Hemorrhoids   . Obesity   . Lower extremity edema     Current Outpatient Prescriptions  Medication Sig Dispense Refill  . carvedilol (COREG) 6.25 MG tablet Take 1 tablet (6.25 mg total) by mouth 2 (two) times daily with a meal. 180 tablet 3  . lisinopril-hydrochlorothiazide (PRINZIDE,ZESTORETIC) 20-12.5 MG per tablet Take 2 tablets by mouth daily. 90 tablet 3  . omeprazole (PRILOSEC) 20 MG capsule Take 1 capsule (20 mg total) by mouth daily. 30 capsule 0   No current facility-administered medications for this visit.    Allergies:    Allergies  Allergen Reactions  . Ezetimibe     REACTION: myalgias  . Statins     Tried, Atorvastatin, Crestor, Pravastatin, Simvastatin = all caused same problem    Social History:  The patient  reports that she has never smoked. She has never used smokeless tobacco. She reports that she drinks alcohol. She reports that she does not use illicit drugs.   Family history:  No family history on file.  ROS:  Please see the history of present illness.  All other systems reviewed and negative.   PHYSICAL EXAM: VS:  BP 144/88 mmHg  Pulse 87  Ht 5\' 5"  (1.651 m)  Wt 174 lb 4.8 oz (79.062 kg)  BMI 29.01 kg/m2 Well nourished, well developed, in no acute distress HEENT: Pupils are equal round react to light accommodation extraocular movements are intact.  Cardiac: Regular rate and rhythm without  murmurs rubs or gallops. Lungs:  clear to auscultation bilaterally, no wheezing, rhonchi or rales Ext: trace lower extremity edema.  2+ radial and dorsalis pedis pulses. M/S: tender to palpation along the trapezius and around the shoulder. Pain is reproducible with passive and active motion of the upper arm Skin: warm and dry Neuro:  Grossly normal  EKG:  Sinus rhythm rate 87 bpm no acute changes compared to prior    ASSESSMENT AND PLAN:  Problem List Items Addressed This Visit    Left arm pain -  Primary    Patient's symptoms of left arm pain are musculoskeletal in nature. It is reproducible with passive and active motion as well as palpation along the trapezius.  Recommended continued use of Aleve as needed. She uses already for her back and it provides the most relief. She is already noticed some improvement since the initial onset 1 week ago anyhow.    Relevant Orders      EKG 12-Lead

## 2014-08-10 NOTE — Patient Instructions (Signed)
Stacy Horton.  Jenny:  [phone]:  [phone]  For massage. 2.  Continue with Aleve as needed. 3.  Follow up with Dr. Salena Saner as schedule.   4.  Flax seed milled.  Chia seed-put in a drink.

## 2014-08-10 NOTE — Assessment & Plan Note (Signed)
We discussed several dietary changes will be helpful in helping control cholesterol.

## 2014-08-23 ENCOUNTER — Ambulatory Visit
Admission: RE | Admit: 2014-08-23 | Discharge: 2014-08-23 | Disposition: A | Discharge status: Home / Self Care | Payer: BC Managed Care – PPO | Source: Ambulatory Visit

## 2014-08-23 DIAGNOSIS — Z1231 Encounter for screening mammogram for malignant neoplasm of breast: Secondary | ICD-10-CM

## 2014-08-24 ENCOUNTER — Other Ambulatory Visit: Payer: Self-pay | Admitting: Family Medicine

## 2014-08-24 DIAGNOSIS — R928 Other abnormal and inconclusive findings on diagnostic imaging of breast: Secondary | ICD-10-CM

## 2014-09-14 ENCOUNTER — Other Ambulatory Visit: Payer: Self-pay | Admitting: Family Medicine

## 2014-09-14 ENCOUNTER — Ambulatory Visit
Admission: RE | Admit: 2014-09-14 | Discharge: 2014-09-14 | Disposition: A | Discharge status: Home / Self Care | Payer: BC Managed Care – PPO | Source: Ambulatory Visit | Attending: *Deleted | Admitting: *Deleted

## 2014-09-14 ENCOUNTER — Encounter (INDEPENDENT_AMBULATORY_CARE_PROVIDER_SITE_OTHER): Payer: Self-pay

## 2014-09-14 DIAGNOSIS — R928 Other abnormal and inconclusive findings on diagnostic imaging of breast: Secondary | ICD-10-CM

## 2014-11-16 ENCOUNTER — Other Ambulatory Visit: Payer: Self-pay | Admitting: Obstetrics and Gynecology

## 2014-11-16 DIAGNOSIS — N6489 Other specified disorders of breast: Secondary | ICD-10-CM

## 2014-12-28 ENCOUNTER — Ambulatory Visit (INDEPENDENT_AMBULATORY_CARE_PROVIDER_SITE_OTHER): Payer: 59 | Admitting: *Deleted

## 2014-12-28 DIAGNOSIS — Z111 Encounter for screening for respiratory tuberculosis: Secondary | ICD-10-CM | POA: Diagnosis not present

## 2014-12-28 NOTE — Progress Notes (Signed)
   PPD placed Left Forearm.  Pt to return 12/30/2014 for reading.  Pt tolerated intradermal injection. Martin, Tamika L, RN          

## 2014-12-30 ENCOUNTER — Ambulatory Visit (INDEPENDENT_AMBULATORY_CARE_PROVIDER_SITE_OTHER): Payer: 59 | Admitting: *Deleted

## 2014-12-30 ENCOUNTER — Encounter: Payer: Self-pay | Admitting: *Deleted

## 2014-12-30 DIAGNOSIS — Z111 Encounter for screening for respiratory tuberculosis: Secondary | ICD-10-CM

## 2014-12-30 DIAGNOSIS — Z7689 Persons encountering health services in other specified circumstances: Secondary | ICD-10-CM

## 2014-12-30 LAB — TB SKIN TEST
INDURATION: 0 mm
TB SKIN TEST: NEGATIVE

## 2014-12-30 NOTE — Progress Notes (Signed)
   PPD Reading Note PPD read and results entered in EpicCare. Result: 0 mm induration. Interpretation: Negative If test not read within 48-72 hours of initial placement, patient advised to repeat in other arm 1-3 weeks after this test. Allergic reaction: no  Kaedon Fanelli L, RN  

## 2015-02-28 ENCOUNTER — Other Ambulatory Visit: Payer: Self-pay | Admitting: Cardiovascular Disease

## 2015-03-01 NOTE — Telephone Encounter (Signed)
Rx(s) sent to pharmacy electronically.  

## 2015-03-16 ENCOUNTER — Ambulatory Visit
Admission: RE | Admit: 2015-03-16 | Discharge: 2015-03-16 | Disposition: A | Discharge status: Home / Self Care | Payer: 59 | Source: Ambulatory Visit | Attending: Obstetrics and Gynecology | Admitting: Obstetrics and Gynecology

## 2015-03-16 DIAGNOSIS — N6489 Other specified disorders of breast: Secondary | ICD-10-CM

## 2015-03-20 ENCOUNTER — Other Ambulatory Visit: Payer: Self-pay | Admitting: Cardiovascular Disease

## 2015-03-20 NOTE — Telephone Encounter (Signed)
E-sent pharmacy Needs an appointment

## 2015-04-06 ENCOUNTER — Encounter: Payer: Self-pay | Admitting: Family Medicine

## 2015-04-06 ENCOUNTER — Ambulatory Visit (INDEPENDENT_AMBULATORY_CARE_PROVIDER_SITE_OTHER): Payer: 59 | Admitting: Family Medicine

## 2015-04-06 VITALS — BP 154/88 | HR 53 | Temp 97.8°F | Ht 65.0 in | Wt 167.4 lb

## 2015-04-06 DIAGNOSIS — E119 Type 2 diabetes mellitus without complications: Secondary | ICD-10-CM | POA: Diagnosis not present

## 2015-04-06 DIAGNOSIS — M25562 Pain in left knee: Secondary | ICD-10-CM

## 2015-04-06 DIAGNOSIS — M25561 Pain in right knee: Secondary | ICD-10-CM

## 2015-04-06 DIAGNOSIS — I10 Essential (primary) hypertension: Secondary | ICD-10-CM

## 2015-04-06 LAB — POCT GLYCOSYLATED HEMOGLOBIN (HGB A1C): Hemoglobin A1C: 6.1

## 2015-04-06 MED ORDER — DICLOFENAC SODIUM 1 % TD GEL: 2.0000 g | Freq: Four times a day (QID) | TRANSDERMAL | Status: DC

## 2015-04-06 NOTE — Patient Instructions (Signed)
Baker Cyst A Baker cyst is a sac-like structure that forms in the back of the knee. It is filled with the same fluid that is located in your knee. This fluid lubricates the bones and cartilage of the knee and allows them to move over each other more easily. CAUSES  When the knee becomes injured or inflamed, increased fluid forms in the knee. When this happens, the joint lining is pushed out behind the knee and forms the Baker cyst. This cyst may also be caused by inflammation from arthritic conditions and infections. SIGNS AND SYMPTOMS  A Baker cyst usually has no symptoms. When the cyst is substantially enlarged:  You may feel pressure behind the knee, stiffness in the knee, or a mass in the area behind the knee.  You may develop pain, redness, and swelling in the calf. This can suggest a blood clot and requires evaluation by your health care provider.  TREATMENT  The treatment depends on the cause of the cyst. Anti-inflammatory medicines and rest often will be prescribed. If the cyst is caused by a bacterial infection, antibiotic medicines may be prescribed.  HOME CARE INSTRUCTIONS   If the cyst was caused by an injury, for the first 24 hours, keep the injured leg elevated on 2 pillows while lying down.  For the first 24 hours while you are awake, apply ice to the injured area:  Put ice in a plastic bag.  Place a towel between your skin and the bag.  Leave the ice on for 20 minutes, 2-3 times a day.  Only take over-the-counter or prescription medicines for pain, discomfort, or fever as directed by your health care provider.  Only take antibiotic medicine as directed. Make sure to finish it even if you start to feel better. MAKE SURE YOU:   Understand these instructions.  Will watch your condition.  Will get help right away if you are not doing well or get worse. Document Released: 09/16/2005 Document Revised: 07/07/2013 Document Reviewed: 04/28/2013 [facility] Patient  Information 2015 [employer], [place]. This information is not intended to replace advice given to you by your health care provider. Make sure you discuss any questions you have with your health care provider.

## 2015-04-08 DIAGNOSIS — M25569 Pain in unspecified knee: Secondary | ICD-10-CM | POA: Insufficient documentation

## 2015-04-08 NOTE — Assessment & Plan Note (Signed)
-   Did not take BP medications yesterday.  - Encouraged taking BP medications as prescribed - Monitor at next visit

## 2015-04-08 NOTE — Progress Notes (Signed)
Subjective:     Patient ID: Stacy Horton, female   DOB: 05/26/1956, 59 y.o.   MRN: [id number]  HPI 59yo female presenting for posterior left knee pain. - Has noted ain for 2-3 weeks. Acute worsening on Sunday, but pain has been improving since then - Feels like knee occasionally tries to give out - Has had trouble ambulating and even had to walk with cane on Monday. This has been improving with improvement of pain. - Notes occasional popping - Denies history of trauma - Notes family members with same presentation who were diagnosed with Arthritis - Has tried Tylenol Arthritis (helped for a few days and then stopped), Aleve (helped for a few days and then stopped), and BC Arthritis (has been helping significantly with pain). - Also due for A1C - Did not take BP medications yesterday   Review of Systems  Musculoskeletal: Positive for arthralgias and gait problem.       Objective:   Physical Exam  Constitutional: She appears well-developed and well-nourished. No distress.  Cardiovascular: Normal rate and regular rhythm.  Exam reveals no gallop and no friction rub.   No murmur heard. Pulmonary/Chest: Effort normal. No respiratory distress. She has no wheezes. She has no rales.  Abdominal: Soft. She exhibits no distension. There is no tenderness.  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 Mild crepitus noted with left knee when placed through ROM. Negative McMurrays.      Assessment:     Please refer to Problem List for Assessment.    Plan:     Please refer to Problem List for Plan.

## 2015-04-08 NOTE — Assessment & Plan Note (Signed)
-   Suspect Baker's Cyst caused by Osteoarthritis - Already improving - Refuses injection - Continue use of NSAIDs for pain. Cautioned against use of daily BC powder - Anticipate improvement of pain with occasional exacerbations

## 2015-04-08 NOTE — Assessment & Plan Note (Signed)
-   A1C 6.1 - Encouraged follow up visit to focus on hypertension and diabetes

## 2015-04-11 ENCOUNTER — Telehealth: Payer: Self-pay | Admitting: Family Medicine

## 2015-04-11 NOTE — Telephone Encounter (Signed)
Pt called because she was told that she could try gabapentin for her pain. She wanted to know would you call this in. jw

## 2015-04-12 NOTE — Telephone Encounter (Signed)
Attempted to call x2 without response.  Her knee pain is from arthritis resulting in a Baker's Cyst, so Gabapentin is not likely to help her and I do not recall discussing this medication at her last office visit. Gabapentin is more for nerve pain than arthritic pain. The medication that is most likely to help her are Tylenol or NSAIDs, such as Aleve or Motrin. She can take up to 500mg  of Aleve every 12hr for 2 weeks. She did have Voltaren prescribed at the last visit. If she would like to reconsider injections for her pain, she may schedule an appointment.  Dr. Caroleen Hammanumley

## 2015-04-12 NOTE — Telephone Encounter (Signed)
LVM for pt to call back to inform her of below. Zimmerman Rumple, April D, CMA  

## 2015-04-14 NOTE — Telephone Encounter (Signed)
Pt called back. I informed her of the information below.  She is doing ok with with Tylenol but will try Aleve.  Pt asked if the Aleve patch is better than the pills. I told pt I was unsure and will forward the question to Dr. Caroleen Hammanumley. Sunday SpillersSharon T Romell Wolden, CMA

## 2015-04-14 NOTE — Telephone Encounter (Signed)
Called and spoke to pt. Pt was unable to talk.  Loud noises in the background. She wasn't able to hear me or wasn't able to listen to me. I asked pt if this was a bad time and she stated "yes". I asked her if she would like to call the office when she had a moment and the pt said "yes, I will call you back.". Sunday SpillersSharon T Kristina Mcnorton, CMA

## 2015-05-04 ENCOUNTER — Other Ambulatory Visit: Payer: Self-pay | Admitting: Cardiovascular Disease

## 2015-05-04 NOTE — Telephone Encounter (Signed)
REFILL 

## 2015-05-12 ENCOUNTER — Ambulatory Visit (INDEPENDENT_AMBULATORY_CARE_PROVIDER_SITE_OTHER): Payer: 59 | Admitting: Obstetrics and Gynecology

## 2015-05-12 ENCOUNTER — Encounter: Payer: Self-pay | Admitting: Obstetrics and Gynecology

## 2015-05-12 VITALS — BP 163/95 | HR 63 | Temp 98.1°F | Wt 168.0 lb

## 2015-05-12 DIAGNOSIS — M25562 Pain in left knee: Secondary | ICD-10-CM

## 2015-05-12 MED ORDER — METHYLPREDNISOLONE ACETATE 40 MG/ML IJ SUSP
40.0000 mg | Freq: Once | INTRAMUSCULAR | Status: AC
  Administered 2015-05-12: 40 mg via INTRA_ARTICULAR

## 2015-05-12 NOTE — Progress Notes (Signed)
Procedure Note: Location: Left knee Anesthesia: Freeze spray  After informed written consent was obtained, patient was seated on exam table. Left knee was prepped with alcohol swab x3. Utilizing anterolateral approach, patient's left knee was injected intraarticularly with mixture of 1cc of depomedrol /mL and 4cc of 1% lidocaine without epinephrine. Patient tolerated the procedure well without immediate complications.  Caryl Ada, DO 05/12/2015, 3:14 PM PGY-2, [facility]

## 2015-05-12 NOTE — Addendum Note (Signed)
Addended by: Georges Lynch T on: 05/12/2015 04:39 PM   Modules accepted: Orders

## 2015-05-12 NOTE — Patient Instructions (Signed)
Knee Injection Joint injections are shots. Your caregiver will place a needle into your knee joint. The needle is used to put medicine into the joint. These shots can be used to help treat different painful knee conditions such as osteoarthritis, bursitis, local flare-ups of rheumatoid arthritis, and pseudogout. Anti-inflammatory medicines such as corticosteroids and anesthetics are the most common medicines used for joint and soft tissue injections.  PROCEDURE  The skin over the kneecap will be cleaned with an antiseptic solution.  Your caregiver will inject a small amount of a local anesthetic (a medicine like Novocaine) just under the skin in the area that was cleaned.  After the area becomes numb, a second injection is done. This second injection usually includes an anesthetic and an anti-inflammatory medicine called a steroid or cortisone. The needle is carefully placed in between the kneecap and the knee, and the medicine is injected into the joint space.  After the injection is done, the needle is removed. Your caregiver may place a bandage over the injection site. The whole procedure takes no more than a couple of minutes. BEFORE THE PROCEDURE  Wash all of the skin around the entire knee area. Try to remove any loose, scaling skin. There is no other specific preparation necessary unless advised otherwise by your caregiver. LET YOUR CAREGIVER KNOW ABOUT:   Allergies.  Medications taken including herbs, eye drops, over the counter medications, and creams.  Use of steroids (by mouth or creams).  Possible pregnancy, if applicable.  Previous problems with anesthetics or Novocaine.  History of blood clots (thrombophlebitis).  History of bleeding or blood problems.  Previous surgery.  Other health problems. RISKS AND COMPLICATIONS Side effects from cortisone shots are rare. They include:   Slight bruising of the skin.  Shrinkage of the normal fatty tissue under the skin where  the shot was given.  Increase in pain after the shot.  Infection.  Weakening of tendons or tendon rupture.  Allergic reaction to the medicine.  Diabetics may have a temporary increase in their blood sugar after a shot.  Cortisone can temporarily weaken the immune system. While receiving these shots, you should not get certain vaccines. Also, avoid contact with anyone who has chickenpox or measles. Especially if you have never had these diseases or have not been previously immunized. Your immune system may not be strong enough to fight off the infection while the cortisone is in your system. AFTER THE PROCEDURE   You can go home after the procedure.  You may need to put ice on the joint 15-20 minutes every 3 or 4 hours until the pain goes away.  You may need to put an elastic bandage on the joint. HOME CARE INSTRUCTIONS   Only take over-the-counter or prescription medicines for pain, discomfort, or fever as directed by your caregiver.  You should avoid stressing the joint. Unless advised otherwise, avoid activities that put a lot of pressure on a knee joint, such as:  Jogging.  Bicycling.  Recreational climbing.  Hiking.  Laying down and elevating the leg/knee above the level of your heart can help to minimize swelling. SEEK MEDICAL CARE IF:   You have repeated or worsening swelling.  There is drainage from the puncture area.  You develop red streaking that extends above or below the site where the needle was inserted. SEEK IMMEDIATE MEDICAL CARE IF:   You develop a fever.  You have pain that gets worse even though you are taking pain medicine.  The area is   red and warm, and you have trouble moving the joint. MAKE SURE YOU:   Understand these instructions.  Will watch your condition.  Will get help right away if you are not doing well or get worse. Document Released: 12/08/2006 Document Revised: 12/09/2011 Document Reviewed: 09/04/2007 Exit[employer] Patient  Information 2015 [employer]. This information is not intended to replace advice given to you by your health care provider. Make sure you discuss any questions you have with your health care provider.  

## 2015-05-12 NOTE — Progress Notes (Signed)
   Subjective:   Patient ID: Stacy Horton, female    DOB: 11/06/1955, 59 y.o.   MRN: [id number]  Patient presents for Same Day Appointment   CC: Knee pain  HPI: # L. knee pain -Continues to endorse left knee pain -Ibuprofen that helps some -Willing to do an injection today -denies any trauma, fevers, n/v -does have gait disturbance and arthrialgias  Review of Systems   See HPI for ROS.   Past medical history, surgical, family, and social history reviewed and updated in the EMR as appropriate.  Objective:  BP 163/95 mmHg  Pulse 63  Temp(Src) 98.1 F (36.7 C) (Oral)  Wt 168 lb (76.204 kg)  Physical Exam  Constitutional: She is well-developed, well-nourished, and in no distress.  L. Knee: no erythema or warmth, normal ROM, mild crepitus appreciated Neuro: abnormal gait, A&Ox3  Assessment & Plan:  L. Knee pain most likely 2/2 OA  -injection performed today (see separate procedure note) -continue OTC ibuprofen prn for pain -handout given on after care instructions for knee injection -RTC prn  Caryl Ada, DO 05/12/2015, 2:56 PM PGY-2, [facility]

## 2015-05-22 ENCOUNTER — Ambulatory Visit: Payer: 59 | Admitting: Family Medicine

## 2015-07-07 ENCOUNTER — Ambulatory Visit (INDEPENDENT_AMBULATORY_CARE_PROVIDER_SITE_OTHER): Payer: 59 | Admitting: Family Medicine

## 2015-07-07 ENCOUNTER — Encounter: Payer: Self-pay | Admitting: Family Medicine

## 2015-07-07 VITALS — BP 126/72 | HR 66 | Temp 98.4°F | Wt 171.0 lb

## 2015-07-07 DIAGNOSIS — M2011 Hallux valgus (acquired), right foot: Secondary | ICD-10-CM

## 2015-07-07 DIAGNOSIS — M1712 Unilateral primary osteoarthritis, left knee: Secondary | ICD-10-CM

## 2015-07-07 MED ORDER — DICLOFENAC SODIUM 1 % TD GEL: 2.0000 g | Freq: Four times a day (QID) | TRANSDERMAL | Status: DC

## 2015-07-07 NOTE — Progress Notes (Signed)
    Subjective: CC:L knee pain HPI: Patient is a 59 y.o. female presenting to clinic today for same day appointment. Concerns today include:  1. Left knee Patient reports that left knee pain has been present since this summer.  No falls or injury.  She had a knee injection in August, with little relief.  Using voltaren cream and knee brace that helps some.  Brings pain down from an 8/10 to 6/10.  Did not have imaging done.  Endorses locking, popping, clicking with movement of knees.  No fevers, chills, swelling of knees.  1 episode of knee buckling.  She notes that going up and down stairs is difficult.  No falls or neuro changes.  She also notes that right knee is starting to give her more trouble but is not as bad as left knee.  Pain is present most of the time.  She notes that pain gets worse throughout the afternoon.    2. Bunion R foot Patient reports that bunion has been present for a while.  She notes that sometimes it is difficult to put her shoe on.  She denies fevers, chills, trauma to the area.  Would like a referral for this.  Social History Reviewed: non smoker. FamHx and MedHx updated.  Please see EMR. Health Maintenance: Flu shot due.  ROS: All other systems reviewed and are negative.  Objective: Office vital signs reviewed. BP 126/72 mmHg  Pulse 66  Temp(Src) 98.4 F (36.9 C) (Oral)  Wt 171 lb (77.565 kg)  Physical Examination:  General: Awake, alert, well nourished, NAD HEENT: Normal, MMM Extremities: WWP, trace LE edema, no cyanosis or clubbing; +2 pulses bilaterally  LLE: no joint line TTP, no erythema or effusions, AROM normal, negative j sign, negative anterior and posterior drawers, negative FADIR and FABER.  R foot: great toe with Hallux valgus deformity.  No joint warmth or TTP.  AROM normal.  MSK: Normal gait and station Skin: dry, intact, no rashes or lesions Neuro: Strength and sensation grossly intact, patellar DTRs 1/4  Assessment/ Plan: 59 y.o.  female with  1. Primary osteoarthritis of left knee.  Also right knee.  L worse than R. No weakness or neuro changes on exam.   - Tylenol arthritis daily. - diclofenac sodium (VOLTAREN) 1 % GEL; Apply 2 g topically 4 (four) times daily.  Dispense: 100 g; Refill: 3 - DG Knee Bilateral Seated and Standing AP; Future - Ambulatory referral to Sports Medicine - Follow up with PCP in 1 month or sooner if neded  2. Bunion of great toe, right.  Does not appear to be gout or joint infection. - Ambulatory referral to Podiatry   Raliegh Ip, DO PGY-2, [facility]

## 2015-07-07 NOTE — Patient Instructions (Signed)
It was a pleasure seeing you today, [name].  Information regarding what we discussed is included in this packet.  Please make an appointment to your PCP in 1 month for arthritis follow up.  Referrals for podiatry and sports medicine have been placed for you.  You should expect a call next week with an appointment.  Please feel free to call our office at [phone] if any questions or concerns arise.  Warm Regards, Cortavius Montesinos M. Sheniqua Carolan, DO Osteoarthritis Osteoarthritis is a disease that causes soreness and inflammation of a joint. It occurs when the cartilage at the affected joint wears down. Cartilage acts as a cushion, covering the ends of bones where they meet to form a joint. Osteoarthritis is the most common form of arthritis. It often occurs in older people. The joints affected most often by this condition include those in the:  Ends of the fingers.  Thumbs.  Neck.  Lower back.  Knees.  Hips. CAUSES  Over time, the cartilage that covers the ends of bones begins to wear away. This causes bone to rub on bone, producing pain and stiffness in the affected joints.  RISK FACTORS Certain factors can increase your chances of having osteoarthritis, including:  Older age.  Excessive body weight.  Overuse of joints.  Previous joint injury. SIGNS AND SYMPTOMS   Pain, swelling, and stiffness in the joint.  Over time, the joint may lose its normal shape.  Small deposits of bone (osteophytes) may grow on the edges of the joint.  Bits of bone or cartilage can break off and float inside the joint space. This may cause more pain and damage. DIAGNOSIS  Your health care provider will do a physical exam and ask about your symptoms. Various tests may be ordered, such as:  X-rays of the affected joint.  Blood tests to rule out other types of arthritis. Additional tests may be used to diagnose your condition. TREATMENT  Goals of treatment are to control pain and improve joint  function. Treatment plans may include:  A prescribed exercise program that allows for rest and joint relief.  A weight control plan.  Pain relief techniques, such as:  Properly applied heat and cold.  Electric pulses delivered to nerve endings under the skin (transcutaneous electrical nerve stimulation [TENS]).  Massage.  Certain nutritional supplements.  Medicines to control pain, such as:  Acetaminophen.  Nonsteroidal anti-inflammatory drugs (NSAIDs), such as naproxen.  Narcotic or central-acting agents, such as tramadol.  Corticosteroids. These can be given orally or as an injection.  Surgery to reposition the bones and relieve pain (osteotomy) or to remove loose pieces of bone and cartilage. Joint replacement may be needed in advanced states of osteoarthritis. HOME CARE INSTRUCTIONS   Take medicines only as directed by your health care provider.  Maintain a healthy weight. Follow your health care provider's instructions for weight control. This may include dietary instructions.  Exercise as directed. Your health care provider can recommend specific types of exercise. These may include:  Strengthening exercises. These are done to strengthen the muscles that support joints affected by arthritis. They can be performed with weights or with exercise bands to add resistance.  Aerobic activities. These are exercises, such as brisk walking or low-impact aerobics, that get your heart pumping.  Range-of-motion activities. These keep your joints limber.  Balance and agility exercises. These help you maintain daily living skills.  Rest your affected joints as directed by your health care provider.  Keep all follow-up visits as directed  by your health care provider. SEEK MEDICAL CARE IF:   Your skin turns red.  You develop a rash in addition to your joint pain.  You have worsening joint pain.  You have a fever along with joint or muscle aches. SEEK IMMEDIATE MEDICAL CARE  IF:  You have a significant loss of weight or appetite.  You have night sweats. FOR MORE INFORMATION   National Institute of Arthritis and Musculoskeletal and Skin Diseases: www.niams.http://www.myers.net/  [employer] on Aging: https://walker.com/  American College of Rheumatology: www.rheumatology.org   This information is not intended to replace advice given to you by your health care provider. Make sure you discuss any questions you have with your health care provider.   Document Released: 09/16/2005 Document Revised: 10/07/2014 Document Reviewed: 05/24/2013 Elsevier Interactive Patient Education [employer]

## 2015-07-14 ENCOUNTER — Other Ambulatory Visit: Payer: Self-pay

## 2015-07-14 DIAGNOSIS — Z1231 Encounter for screening mammogram for malignant neoplasm of breast: Secondary | ICD-10-CM

## 2015-07-19 ENCOUNTER — Ambulatory Visit
Admission: RE | Admit: 2015-07-19 | Discharge: 2015-07-19 | Disposition: A | Discharge status: Home / Self Care | Payer: 59 | Source: Ambulatory Visit | Attending: Family Medicine | Admitting: Family Medicine

## 2015-07-19 ENCOUNTER — Other Ambulatory Visit: Payer: Self-pay | Admitting: Family Medicine

## 2015-07-19 DIAGNOSIS — M1712 Unilateral primary osteoarthritis, left knee: Secondary | ICD-10-CM

## 2015-07-21 ENCOUNTER — Encounter: Payer: Self-pay | Admitting: Family Medicine

## 2015-07-21 ENCOUNTER — Ambulatory Visit (INDEPENDENT_AMBULATORY_CARE_PROVIDER_SITE_OTHER): Payer: 59 | Admitting: Family Medicine

## 2015-07-21 VITALS — BP 141/80 | Ht 65.0 in | Wt 170.0 lb

## 2015-07-21 DIAGNOSIS — M25562 Pain in left knee: Secondary | ICD-10-CM

## 2015-07-21 DIAGNOSIS — S76319D Strain of muscle, fascia and tendon of the posterior muscle group at thigh level, unspecified thigh, subsequent encounter: Secondary | ICD-10-CM | POA: Diagnosis not present

## 2015-07-21 NOTE — Assessment & Plan Note (Signed)
Posterior medial knee pain likely due to hamstring strain.  X-rays revealed mild arthritis that maybe contributing as well.  Ultrasound did not reveal any obvious hamstring tears, but does have mild effusion - Fitted with knee sleeve - Tylenol and ice for pain - Educated on concentric hamstring exercises - Advised follow-up in 3-4 weeks for reassessment

## 2015-07-21 NOTE — Progress Notes (Signed)
Patient ID: Verne CarrowSheliah A Heagle, female   DOB: 06/11/1956, 59 y.o.   MRN: [id number] [facility] Attending Note: I have seen and examined this patient. I have discussed this patient with the resident and reviewed the assessment and plan as documented above. I agree with the resident's findings and plan. Knee pain.  1. I think she has a resolving mild to moderate medial hamstring strain.  The ultrasound was essentially negative so Idon't think there is a big tear. I did not see any fluid collection. There was some mild disruption of the distal fibers that could be consistent with a healing strain.    We recommended a neoprene knee sleeve to be worn only during activity (not at night).  I gave her prescription for one and told her if it was too expensive she can probably get one from the drugstore that would work just as well. She could use a regular knee sleeve or an open patella knee sleeve.    We gave her handout on some strengthening exercises for the hamstring using a therapy and with focus on isometric contraction. I do not want her doing stretching at this point. She's not improving in 3-4 weeks she'll return.    We also evaluated the meniscus of her knee which looks remarkably good given her age. There are some small osteophytes and very little bit of fluid surrounding the meniscus but nothing unusual.    I reviewed her x-rays of both knees, complete knee, from 07/19/2015. I agree with the report. Essentially xray s are consistent with mild to moderate tricompartmental osteoarthritis.    I discussed this with her at length today and answered all of her questions.

## 2015-07-21 NOTE — Patient Instructions (Signed)
Your left knee pain is most likely due to hamstring strengthening; this is the muscle that goes down the back of your thigh.  We have given you a knee sleeve that will help provide protection and support while your muscles heal.  You should also do the hamstring exercises as instructed 3-4 times a week for the next 2-3 weeks.  Your knee pain may also be exacerbated by mild arthritis that was revealed on your x-rays, the exercises and knee brace well with this as well.   Follow up with us in 3-4 weeks.

## 2015-07-21 NOTE — Progress Notes (Signed)
  Stacy Horton - 59 y.o. female MRN [id number]  Date of birth: 12/15/1955    SUBJECTIVE:     She comes in today for evaluation of left knee pain.  Reports left medial and posterior knee pain beginning in June.  Denies any acute trauma or injuries but does report stepping off a sidewalk in May and notices some mild pain afterwards.  Since then she has had persistent left knee pain.  Evaluated by PCP and treated with steroid injection for possible arthritis.  She denies any improvement after steroid injection, and was sent here by PCP for further evaluation.  Pain is achy and pulling in nature and radiates from the posterior medial knee towards her posterior thigh.  Denies any swelling in her knee but has chronic bilateral lower extremity swelling.  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   ROS:     See HPI  PERTINENT  PMH / PSH FH / / SH:  Past Medical, Surgical, Social, and Family History Reviewed & Updated in the EMR.  Pertinent findings include:  - Notable for diabetes, hypertension, and back pain  OBJECTIVE: BP 141/80 mmHg  Ht 5\' 5"  (1.651 m)  Wt 170 lb (77.111 kg)  BMI 28.29 kg/m2  Physical Exam:  Vital signs are reviewed.  Left Knee: Inspection: No erythema; No Bruising;  Palpation:  No warmth; No effusion; Patellar glide with mild crepitus; Mild Tenderness along posteriomedial knee ROM: Full flexion, extension Ligaments: Solid with consistent endpoints including ACL, PCL, LCL, MCL Negative Mcmurray's and Thessaly tests. Non painful patellar compression Patellar Apprehension Negative Patellar and quadriceps tendons unremarkable. Pain reproduced with resisted knee flexion and resisted internal rotation  Knee US - Mild effusion noted - Bilateral meniscus appear intact - Medial gastroc tendon intact - Mild irregularities but no obvious tearing and medial hamstrings  ASSESSMENT & PLAN:  No problem-specific assessment & plan notes found for this  encounter.

## 2015-08-11 ENCOUNTER — Ambulatory Visit
Admission: RE | Admit: 2015-08-11 | Discharge: 2015-08-11 | Disposition: A | Discharge status: Home / Self Care | Payer: 59 | Source: Ambulatory Visit

## 2015-08-11 DIAGNOSIS — Z1231 Encounter for screening mammogram for malignant neoplasm of breast: Secondary | ICD-10-CM

## 2015-08-16 ENCOUNTER — Other Ambulatory Visit: Payer: Self-pay

## 2015-08-16 MED ORDER — LISINOPRIL-HYDROCHLOROTHIAZIDE 20-12.5 MG PO TABS: ORAL_TABLET | ORAL | Status: DC

## 2015-09-05 ENCOUNTER — Telehealth: Payer: Self-pay | Admitting: Family Medicine

## 2015-09-05 NOTE — Telephone Encounter (Signed)
[facility] Please advise

## 2015-09-05 NOTE — Telephone Encounter (Signed)
Will forward to referral coordinator to see if it is too late to change.  Patient already scheduled for [facility].  Romilda Proby,CMA

## 2015-09-06 ENCOUNTER — Telehealth: Payer: Self-pay | Admitting: Family Medicine

## 2015-09-06 NOTE — Telephone Encounter (Signed)
Pt calling to inform Tia, referral coordinator, that [facility]. Thank you, Dorothey BasemanSadie Reynolds, ASA

## 2015-09-07 ENCOUNTER — Encounter: Payer: Self-pay | Admitting: Podiatry

## 2015-09-07 ENCOUNTER — Ambulatory Visit (INDEPENDENT_AMBULATORY_CARE_PROVIDER_SITE_OTHER): Payer: 59

## 2015-09-07 ENCOUNTER — Ambulatory Visit (INDEPENDENT_AMBULATORY_CARE_PROVIDER_SITE_OTHER): Payer: 59 | Admitting: Podiatry

## 2015-09-07 VITALS — BP 138/84 | HR 66 | Resp 16

## 2015-09-07 DIAGNOSIS — M21619 Bunion of unspecified foot: Secondary | ICD-10-CM

## 2015-09-07 NOTE — Progress Notes (Signed)
   Subjective:    Patient ID: Stacy CarrowSheliah A Stults, female    DOB: 03/23/1956, 59 y.o.   MRN: [id number]  HPI  Pt presents with bilateral bunions right over left  Review of Systems  All other systems reviewed and are negative.      Objective:   Physical Exam        Assessment & Plan:

## 2015-09-07 NOTE — Patient Instructions (Signed)
Pre-Operative Instructions  Congratulations, you have decided to take an important step to improving your quality of life.  You can be assured that the doctors of [facility] will be with you every step of the way.  1. Plan to be at the surgery center/hospital at least 1 (one) hour prior to your scheduled time unless otherwise directed by the surgical center/hospital staff.  You must have a responsible adult accompany you, remain during the surgery and drive you home.  Make sure you have directions to the surgical center/hospital and know how to get there on time. 2. For hospital based surgery you will need to obtain a history and physical form from your family physician within 1 month prior to the date of surgery- we will give you a form for you primary physician.  3. We make every effort to accommodate the date you request for surgery.  There are however, times where surgery dates or times have to be moved.  We will contact you as soon as possible if a change in schedule is required.   4. No Aspirin/Ibuprofen for one week before surgery.  If you are on aspirin, any non-steroidal anti-inflammatory medications (Mobic, Aleve, Ibuprofen) you should stop taking it 7 days prior to your surgery.  You make take Tylenol  For pain prior to surgery.  5. Medications- If you are taking daily heart and blood pressure medications, seizure, reflux, allergy, asthma, anxiety, pain or diabetes medications, make sure the surgery center/hospital is aware before the day of surgery so they may notify you which medications to take or avoid the day of surgery. 6. No food or drink after midnight the night before surgery unless directed otherwise by surgical center/hospital staff. 7. No alcoholic beverages 24 hours prior to surgery.  No smoking 24 hours prior to or 24 hours after surgery. 8. Wear loose pants or shorts- loose enough to fit over bandages, boots, and casts. 9. No slip on shoes, sneakers are best. 10. Bring  your boot with you to the surgery center/hospital.  Also bring crutches or a walker if your physician has prescribed it for you.  If you do not have this equipment, it will be provided for you after surgery. 11. If you have not been contracted by the surgery center/hospital by the day before your surgery, call to confirm the date and time of your surgery. 12. Leave-time from work may vary depending on the type of surgery you have.  Appropriate arrangements should be made prior to surgery with your employer. 13. Prescriptions will be provided immediately following surgery by your doctor.  Have these filled as soon as possible after surgery and take the medication as directed. 14. Remove nail polish on the operative foot. 15. Wash the night before surgery.  The night before surgery wash the foot and leg well with the antibacterial soap provided and water paying special attention to beneath the toenails and in between the toes.  Rinse thoroughly with water and dry well with a towel.  Perform this wash unless told not to do so by your physician.  Enclosed: 1 Ice pack (please put in freezer the night before surgery)   1 Hibiclens skin cleaner   Pre-op Instructions  If you have any questions regarding the instructions, do not hesitate to call our office.  [place]: [address] [place], [place] [address] [phone]  [place]: [address] [place], [place] [address] [phone]  [place]: [address]  [place], [place] [address] [phone]  Dr. Richard   Tuchman DPM, Dr. Norman Regal DPM Dr. Richard Sikora DPM, Dr. M. Todd Hyatt DPM, Dr. Kathryn Egerton DPM 

## 2015-09-08 NOTE — Progress Notes (Addendum)
Subjective:     Patient ID: Stacy Horton, female   DOB: 06/20/1956, 59 y.o.   MRN: [id number]  HPI patient presents with painful bunion deformity right over left and states that she's tried wider shoes she's tried soaks of this and she's tried anti-inflammatories. States that the pain has reached the point where it's difficult to wear shoe gears on the right over left foot   Review of Systems  All other systems reviewed and are negative.      Objective:   Physical Exam  Constitutional: She is oriented to person, place, and time.  Cardiovascular: Intact distal pulses.   Musculoskeletal: Normal range of motion.  Neurological: She is oriented to person, place, and time.  Skin: Skin is warm.  Nursing note and vitals reviewed.  neurovascular status intact muscle strength adequate range of motion within normal limits with patient noted to have hyperostosis medial aspect first metatarsal head right over left with redness and pain when palpated. 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 Due to long-standing nature worsening of symptoms and the fact that she is off of work over Christmas she is motivated to get this fixed on the right and at this time I allowed her to read a consent form reviewing alternative treatments and complications for Austin-type osteotomy with screw fixation right. Patient is comfortable with this signs consent form after extensive review and is scheduled for outpatient surgery. I dispensed air fracture walker for postoperative usage and gave and preoperative instructions and encouraged her to call with any questions prior to procedure     X-ray report indicates that there is an elevation of  the intermetatarsal angle between the first and the second of 16 on the right foot and 15 on the left foot. There is tibial sesamoidal shift position of approximate 5 and deviation of the big toe against the second toe bilateral

## 2015-09-15 ENCOUNTER — Telehealth: Payer: Self-pay | Admitting: *Deleted

## 2015-09-15 NOTE — Telephone Encounter (Signed)
"  I'm calling to request clinical notes on patient to review for authorization process for upcoming bunion surgery 09/27/2015."  What's your fax number?  "My fax number is [phone]."  I'll send it over.

## 2015-09-27 DIAGNOSIS — M2011 Hallux valgus (acquired), right foot: Secondary | ICD-10-CM | POA: Diagnosis not present

## 2015-09-28 ENCOUNTER — Telehealth: Payer: Self-pay | Admitting: *Deleted

## 2015-09-28 NOTE — Telephone Encounter (Addendum)
Pt states she is vomiting the Percocet even after taking with food.  I told pt I would ask Dr. Charlsie Merlesegal if he would change the medication and call her again.  Dr. Charlsie Merlesegal states can take Ibuprofen OTC as package instructs. Informed pt.

## 2015-10-04 ENCOUNTER — Encounter: Payer: Self-pay | Admitting: Podiatry

## 2015-10-04 ENCOUNTER — Other Ambulatory Visit: Payer: Self-pay | Admitting: Family Medicine

## 2015-10-04 ENCOUNTER — Ambulatory Visit (INDEPENDENT_AMBULATORY_CARE_PROVIDER_SITE_OTHER): Payer: BLUE CROSS/BLUE SHIELD

## 2015-10-04 ENCOUNTER — Ambulatory Visit (INDEPENDENT_AMBULATORY_CARE_PROVIDER_SITE_OTHER): Payer: BLUE CROSS/BLUE SHIELD | Admitting: Podiatry

## 2015-10-04 VITALS — BP 138/73 | HR 60 | Resp 16

## 2015-10-04 DIAGNOSIS — M21619 Bunion of unspecified foot: Secondary | ICD-10-CM

## 2015-10-04 DIAGNOSIS — Z9889 Other specified postprocedural states: Secondary | ICD-10-CM | POA: Diagnosis not present

## 2015-10-04 NOTE — Progress Notes (Signed)
Subjective:     Patient ID: Stacy Horton, female   DOB: 09/18/1956, 60 y.o.   MRN: [id number]  HPI patient presents stating I'm doing very well with my bunion correction right   Review of Systems     Objective:   Physical Exam  neurovascular status intact muscle strength adequate with well-healed surgical site right first metatarsal with wound edges well coapted and good range of motion first MPJ with negative Homans sign noted    Assessment:      doing well post Austin osteotomy right foot    Plan:      x-rays reviewed and at this time advised on continued elevation compression and dispensed surgical shoe. Continue immobilization gradually and increase activity and reappoint 3 weeks or earlier if needed

## 2015-10-05 ENCOUNTER — Other Ambulatory Visit: Payer: Self-pay

## 2015-10-13 ENCOUNTER — Other Ambulatory Visit: Payer: Self-pay | Admitting: *Deleted

## 2015-10-13 ENCOUNTER — Telehealth: Payer: Self-pay | Admitting: Cardiology

## 2015-10-13 MED ORDER — LISINOPRIL-HYDROCHLOROTHIAZIDE 20-12.5 MG PO TABS: ORAL_TABLET | ORAL | Status: DC

## 2015-10-13 NOTE — Telephone Encounter (Signed)
Pt needs refill of Lisinopril and HZTZ made appt fro 10-23-15

## 2015-10-16 MED ORDER — LISINOPRIL-HYDROCHLOROTHIAZIDE 20-12.5 MG PO TABS: 2.0000 | ORAL_TABLET | Freq: Every day | ORAL | Status: DC

## 2015-10-16 NOTE — Telephone Encounter (Signed)
Rx(s) sent to pharmacy electronically.  

## 2015-10-23 ENCOUNTER — Ambulatory Visit: Payer: Self-pay | Admitting: Family Medicine

## 2015-10-23 ENCOUNTER — Encounter: Payer: Self-pay | Admitting: Family Medicine

## 2015-10-23 ENCOUNTER — Ambulatory Visit: Payer: Self-pay | Admitting: Cardiovascular Disease

## 2015-10-23 ENCOUNTER — Ambulatory Visit (INDEPENDENT_AMBULATORY_CARE_PROVIDER_SITE_OTHER): Payer: BLUE CROSS/BLUE SHIELD | Admitting: Family Medicine

## 2015-10-23 VITALS — BP 145/85 | HR 60 | Temp 98.1°F | Ht 65.0 in | Wt 174.0 lb

## 2015-10-23 DIAGNOSIS — I10 Essential (primary) hypertension: Secondary | ICD-10-CM

## 2015-10-23 DIAGNOSIS — E119 Type 2 diabetes mellitus without complications: Secondary | ICD-10-CM

## 2015-10-23 LAB — POCT GLYCOSYLATED HEMOGLOBIN (HGB A1C): HEMOGLOBIN A1C: 6.4

## 2015-10-23 MED ORDER — NEBIVOLOL HCL 10 MG PO TABS: 10.0000 mg | ORAL_TABLET | Freq: Every day | ORAL | Status: DC

## 2015-10-23 MED ORDER — LISINOPRIL-HYDROCHLOROTHIAZIDE 20-12.5 MG PO TABS
2.0000 | ORAL_TABLET | Freq: Every day | ORAL | Status: DC
Start: 2015-10-23 — End: 2015-11-10

## 2015-10-23 NOTE — Patient Instructions (Signed)
Thank you so much for coming to visit me today! I have placed a referral to Cardiology for you. We have refilled several medications. Please continue to work on your diet and exercise and set up an appointment with the [facility] if you wish. Follow up in 6months so we can recheck your A1C.  Thanks again! Dr. Caroleen Hamman

## 2015-10-24 NOTE — Progress Notes (Signed)
Subjective:     Patient ID: Stacy Horton, female   DOB: September 02, 1956, 60 y.o.   MRN: [id number]  HPI Stacy Horton is a 60yo female presenting for referral placement. - Already follows with cardiology for management of hypertension, but needs updated referral in place. - Requests refill of Bystolic and Lisinopril-HCTZ.  - States Bystolic is very expensive and she would like to consider transition to more affordable medication. Cardiology managing antihypertensive medications. - Up to date on colonoscopy and mammogram.  - Last pap smear in chart in 2005, however states she normally has this done at ob/gyn. States she will follow up with them if due. - A1C 6.1 in 03/2015 - Interested in improving diet and exercise. Currently in boot, but about to come out of it so she can start exercising more. Interested in follow up with Nutrition. - Up to date on labwork - Denies any acute problems  Review of Systems  Per HPI. Other systems negative.    Objective:   Physical Exam  Constitutional: She appears well-developed and well-nourished. No distress.  HENT:  Head: Normocephalic and atraumatic.  Mouth/Throat: Oropharynx is clear and moist.  Tympanic membranes normal bilaterally  Neck: No thyromegaly present.  Cardiovascular: Regular rhythm.  Exam reveals no gallop and no friction rub.   No murmur heard. Pulmonary/Chest: Effort normal. No respiratory distress. She has no wheezes.  Abdominal: Soft. She exhibits no distension. There is no tenderness.  Musculoskeletal: She exhibits edema.  Boot present on right foot  Skin: Skin is warm. No rash noted.  Psychiatric: She has a normal mood and affect. Her behavior is normal.       Assessment and Plan:     Essential hypertension - Stable - Currently managed by Cardiology - Updated referral to Cardiology placed - Refills of Bystolic and Lisinopril-HCTZ given. Encouraged to discuss transitioning to Bystolic alternative with Cardiology given  difficulties affording.  Diabetes type 2, controlled - A1C 6.4 today - Continue to work on diet and exercise - Encouraged to set up appointment with Nutrition clinic - Follow up in 6months to recheck A1C

## 2015-10-24 NOTE — Assessment & Plan Note (Signed)
-   A1C 6.4 today - Continue to work on diet and exercise - Encouraged to set up appointment with Nutrition clinic - Follow up in 6months to recheck A1C

## 2015-10-24 NOTE — Assessment & Plan Note (Addendum)
-   Stable - Currently managed by Cardiology - Updated referral to Cardiology placed - Refills of Bystolic and Lisinopril-HCTZ given. Encouraged to discuss transitioning to Bystolic alternative with Cardiology given difficulties affording.

## 2015-10-30 ENCOUNTER — Ambulatory Visit (INDEPENDENT_AMBULATORY_CARE_PROVIDER_SITE_OTHER): Payer: BLUE CROSS/BLUE SHIELD

## 2015-10-30 ENCOUNTER — Encounter: Payer: Self-pay | Admitting: Cardiovascular Disease

## 2015-10-30 ENCOUNTER — Ambulatory Visit (INDEPENDENT_AMBULATORY_CARE_PROVIDER_SITE_OTHER): Payer: BLUE CROSS/BLUE SHIELD | Admitting: Cardiovascular Disease

## 2015-10-30 ENCOUNTER — Ambulatory Visit (INDEPENDENT_AMBULATORY_CARE_PROVIDER_SITE_OTHER): Payer: BLUE CROSS/BLUE SHIELD | Admitting: Podiatry

## 2015-10-30 VITALS — BP 142/90 | HR 59 | Ht 65.0 in | Wt 174.7 lb

## 2015-10-30 DIAGNOSIS — M21619 Bunion of unspecified foot: Secondary | ICD-10-CM | POA: Diagnosis not present

## 2015-10-30 DIAGNOSIS — E119 Type 2 diabetes mellitus without complications: Secondary | ICD-10-CM

## 2015-10-30 DIAGNOSIS — E7800 Pure hypercholesterolemia, unspecified: Secondary | ICD-10-CM

## 2015-10-30 DIAGNOSIS — Z9889 Other specified postprocedural states: Secondary | ICD-10-CM

## 2015-10-30 DIAGNOSIS — I10 Essential (primary) hypertension: Secondary | ICD-10-CM | POA: Diagnosis not present

## 2015-10-30 MED ORDER — NEBIVOLOL HCL 20 MG PO TABS: 20.0000 mg | ORAL_TABLET | Freq: Every day | ORAL | Status: DC

## 2015-10-30 NOTE — Patient Instructions (Signed)
Your physician has recommended you make the following change in your medication: INCREASE BYSTOLIC TO 20 MG DAILY  Dr. Royann Shivers recommends that you schedule a follow-up appointment in: ONE YEAR

## 2015-10-30 NOTE — Progress Notes (Signed)
Patient ID: Stacy Horton, female   DOB: 10/25/1955, 60 y.o.   MRN: [id number]    Cardiology Office Note    Date:  10/30/2015   ID:  Stacy Horton, DOB 1956-05-25, MRN [id number]  PCP:  Stacy Heater, DO  Cardiologist:   Stacy Fair, MD   Chief Complaint  Patient presents with  . Annual Exam    pt c/o SOB on overexertion, tingling/numbness in left arm and shoulder    History of Present Illness:  Stacy Horton is a 60 y.o. female with systemic hypertension, type 2 diabetes mellitus (diet controlled) and hyperlipidemia without known structural heart disease who presents for follow-up. She has been intolerant to multiple statins and Zetia. She does not have known peripheral or coronary arteriopathy.  At one point she was taking both carvedilol and nebivolol for her blood pressure and had excellent blood pressure control (120-130/70-80) When taking bystolic she felt better and had less fatigue but had to stop this medication since it was too expensive. The carvedilol provides similarly good blood pressure control, but is associated with low energy level.  She is wearing a special boot after having onion ectomy on the right foot. 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  Past Medical History  Diagnosis Date  . Diabetes mellitus   . Hypertension   . Hyperlipidemia   . Hemorrhoids   . Obesity   . Lower extremity edema     Past Surgical History  Procedure Laterality Date  . US echocardiography  09/28/2010    normal  . Nm myoview ltd  03/15/2008    no ischemia    Outpatient Prescriptions Prior to Visit  Medication Sig Dispense Refill  . diclofenac sodium (VOLTAREN) 1 % GEL Apply 2 g topically 4 (four) times daily. 100 g 3  . lisinopril-hydrochlorothiazide (PRINZIDE,ZESTORETIC) 20-12.5 MG tablet Take 2 tablets by mouth daily. MUST KEEP APPOINTMENT 10/30/2015 WITH DR Stacy Horton FOR FUTURE REFILLS 60 tablet 3  . omeprazole (PRILOSEC)  20 MG capsule Take 1 capsule (20 mg total) by mouth daily. 30 capsule 0  . carvedilol (COREG) 6.25 MG tablet Take 1 tablet (6.25 mg total) by mouth 2 (two) times daily with a meal. 180 tablet 3  . nebivolol (BYSTOLIC) 10 MG tablet Take 1 tablet (10 mg total) by mouth daily. 90 tablet 3  . BYSTOLIC 10 MG tablet     . carvedilol (COREG) 6.25 MG tablet TAKE ONE TABLET BY MOUTH TWICE DAILY WITH MEALS 180 tablet 6   No facility-administered medications prior to visit.     Allergies:   Ezetimibe; Percocet; and Statins   Social History   Social History  . Marital Status: Divorced    Spouse Name: N/A  . Number of Children: N/A  . Years of Education: N/A   Social History Main Topics  . Smoking status: Never Smoker   . Smokeless tobacco: Never Used  . Alcohol Use: Yes     Comment: occas  . Drug Use: No  . Sexual Activity: Not Asked   Other Topics Concern  . None   Social History Narrative      ROS:   Please see the history of present illness.    ROS All other systems reviewed and are negative.   PHYSICAL EXAM:   VS:  BP 142/90 mmHg  Pulse 59  Ht  (1.651 m)  Wt 79.243 kg (174 lb 11.2 oz)  BMI 29.07 kg/m2   GEN:  Well nourished, well developed, in no acute distress HEENT: normal Neck: no JVD, carotid bruits, or masses Cardiac: RRR; no murmurs, rubs, or gallops,no edema  Respiratory:  clear to auscultation bilaterally, normal work of breathing GI: soft, nontender, nondistended, + BS MS: no deformity or atrophy Skin: warm and dry, no rash Neuro:  Alert and Oriented x 3, Strength and sensation are intact Psych: euthymic mood, full affect  Wt Readings from Last 3 Encounters:  10/30/15 79.243 kg (174 lb 11.2 oz)  10/23/15 78.926 kg (174 lb)  07/21/15 77.111 kg (170 lb)      Studies/Labs Reviewed:   EKG:  EKG is ordered today.  The ekg ordered today demonstrates normal sinus rhythm, normal tracing  Recent Labs: No results found for requested labs within last 365  days.   Lipid Panel    Component Value Date/Time   CHOL 229* 04/26/2014 1035   TRIG 104 04/26/2014 1035   HDL 47 04/26/2014 1035   CHOLHDL 4.9 04/26/2014 1035   VLDL 21 04/26/2014 1035   LDLCALC 161* 04/26/2014 1035   LDLDIRECT 129* 12/30/2008 1930    ASSESSMENT:    1. Essential hypertension   2. Hypercholesterolemia   3. Controlled type 2 diabetes mellitus without complication, without long-term current use of insulin (HCC)      PLAN:  In order of problems listed above:  1. HTN: We'll try to see if nebivolol 20 mg daily provides ideal blood pressure control without side effects of fatigue. Gave her a co-pay card. If the medication is affordable, this should be long-term treatment for her, in addition to ACE inhibitor/diuretic combo. If however the medication is unaffordable or if it does not provide blood pressure control without causing fatigue, and carvedilol 12.5 mg twice daily would be a less expensive alternative 2. HLP: Left right both water-soluble and lipid soluble statins without success. She did not even tolerate Zetia. Since she does not have established vascular disease, will continue with diet/lifestyle changes. She does not meet criteria for PCS K9 inhibitors 3. DM: Reports good glycemic control without medications    Medication Adjustments/Labs and Tests Ordered: Current medicines are reviewed at length with the patient today.  Concerns regarding medicines are outlined above.  Medication changes, Labs and Tests ordered today are listed in the Patient Instructions below. Patient Instructions  Your physician has recommended you make the following change in your medication: INCREASE BYSTOLIC TO 20 MG DAILY  Dr. Royann Horton recommends that you schedule a follow-up appointment in: ONE YEAR          SignedThurmon Fair, MD  10/30/2015 12:37 PM    [facility] [address] [place], [place], [place]  [address] Phone: [phone]; Fax: [phone]

## 2015-10-31 ENCOUNTER — Other Ambulatory Visit: Payer: Self-pay | Admitting: *Deleted

## 2015-11-02 ENCOUNTER — Telehealth: Payer: Self-pay | Admitting: *Deleted

## 2015-11-02 NOTE — Telephone Encounter (Signed)
Kelly from [employer] called and stated that she was following up on a prior authorization request for patients bystolic. Please advise. Thanks, MI

## 2015-11-07 NOTE — Telephone Encounter (Signed)
[employer] wants PA's done through CoverMyMeds. Currently unable to get online with their Web site.

## 2015-11-10 ENCOUNTER — Telehealth: Payer: Self-pay | Admitting: *Deleted

## 2015-11-10 DIAGNOSIS — Z79899 Other long term (current) drug therapy: Secondary | ICD-10-CM

## 2015-11-10 DIAGNOSIS — E785 Hyperlipidemia, unspecified: Secondary | ICD-10-CM

## 2015-11-10 MED ORDER — CARVEDILOL 12.5 MG PO TABS: 12.5000 mg | ORAL_TABLET | Freq: Two times a day (BID) | ORAL | Status: DC

## 2015-11-10 MED ORDER — LISINOPRIL-HYDROCHLOROTHIAZIDE 20-12.5 MG PO TABS: 2.0000 | ORAL_TABLET | Freq: Every day | ORAL | Status: DC

## 2015-11-10 NOTE — Telephone Encounter (Signed)
PA denied for Bystolic.  Dr. Salena Saner reviewed and changed med to carvedilol 12.5 mg bid.  Patient notified and voiced understanding.  Rx sent to pharmacy.

## 2015-11-10 NOTE — Telephone Encounter (Signed)
Insurance will not cover bystolic - switched to carvedilol 12.5 mg bid.  Rx sent to pharmacy.  Patient instructed and voiced understanding.  Requested a lab order be placed for yearly labs.  Order placed.

## 2015-11-14 LAB — COMPREHENSIVE METABOLIC PANEL
ALBUMIN: 3.7 g/dL (ref 3.6–5.1)
ALT: 14 U/L (ref 6–29)
AST: 17 U/L (ref 10–35)
Alkaline Phosphatase: 72 U/L (ref 33–130)
BILIRUBIN TOTAL: 0.7 mg/dL (ref 0.2–1.2)
BUN: 15 mg/dL (ref 7–25)
CO2: 26 mmol/L (ref 20–31)
CREATININE: 0.83 mg/dL (ref 0.50–0.99)
Calcium: 9.6 mg/dL (ref 8.6–10.4)
Chloride: 107 mmol/L (ref 98–110)
GLUCOSE: 120 mg/dL — AB (ref 65–99)
Potassium: 3.8 mmol/L (ref 3.5–5.3)
SODIUM: 139 mmol/L (ref 135–146)
Total Protein: 6.7 g/dL (ref 6.1–8.1)

## 2015-11-14 LAB — LIPID PANEL
Cholesterol: 236 mg/dL — ABNORMAL HIGH (ref 125–200)
HDL: 45 mg/dL — ABNORMAL LOW (ref >=46)
LDL CALC: 173 mg/dL — AB (ref <130)
Total CHOL/HDL Ratio: 5.2 Ratio — ABNORMAL HIGH (ref <=5.0)
Triglycerides: 92 mg/dL (ref <150)
VLDL: 18 mg/dL (ref <30)

## 2015-11-16 ENCOUNTER — Telehealth: Payer: Self-pay | Admitting: Cardiovascular Disease

## 2015-11-16 NOTE — Telephone Encounter (Signed)
[facility] 11/16/15 9:09am

## 2015-11-16 NOTE — Telephone Encounter (Signed)
New Message  Pt returning RN phone call. Please call back and discuss.   

## 2015-11-16 NOTE — Telephone Encounter (Signed)
Lab results called back to patient.  Results mailed at her request.

## 2015-11-27 ENCOUNTER — Ambulatory Visit: Payer: Self-pay

## 2015-11-27 ENCOUNTER — Ambulatory Visit (INDEPENDENT_AMBULATORY_CARE_PROVIDER_SITE_OTHER): Payer: BLUE CROSS/BLUE SHIELD | Admitting: Podiatry

## 2015-11-27 ENCOUNTER — Encounter: Payer: Self-pay | Admitting: Podiatry

## 2015-11-27 DIAGNOSIS — Z9889 Other specified postprocedural states: Secondary | ICD-10-CM

## 2015-11-27 DIAGNOSIS — M21619 Bunion of unspecified foot: Secondary | ICD-10-CM

## 2015-11-28 NOTE — Progress Notes (Signed)
Subjective:     Patient ID: Stacy Horton, female   DOB: 1956-07-10, 59 y.o.   MRN: [id number]  HPI patient states I'm doing well with my right foot with good alignment and minimal discomfort   Review of Systems     Objective:   Physical Exam Neurovascular status intact with good alignment first MPJ with wound edges well coapted and good range of motion of the first MPJ    Assessment:     Doing well post osteotomy right first MPJ    Plan:     X-rays reviewed and patient can return to normal shoes will be seen back as needed  X-ray report indicating good alignment of the first MPJ with screw in place joint congruence with no indications of movement

## 2016-02-15 ENCOUNTER — Ambulatory Visit: Payer: BLUE CROSS/BLUE SHIELD | Admitting: Podiatry

## 2016-02-19 ENCOUNTER — Encounter: Payer: Self-pay | Admitting: Podiatry

## 2016-02-19 ENCOUNTER — Ambulatory Visit (INDEPENDENT_AMBULATORY_CARE_PROVIDER_SITE_OTHER): Payer: BLUE CROSS/BLUE SHIELD | Admitting: Podiatry

## 2016-02-19 ENCOUNTER — Ambulatory Visit (INDEPENDENT_AMBULATORY_CARE_PROVIDER_SITE_OTHER): Payer: BLUE CROSS/BLUE SHIELD

## 2016-02-19 DIAGNOSIS — M722 Plantar fascial fibromatosis: Secondary | ICD-10-CM | POA: Diagnosis not present

## 2016-02-19 DIAGNOSIS — M79671 Pain in right foot: Secondary | ICD-10-CM

## 2016-02-19 MED ORDER — TRIAMCINOLONE ACETONIDE 10 MG/ML IJ SUSP
10.0000 mg | Freq: Once | INTRAMUSCULAR | Status: AC
  Administered 2016-02-19: 10 mg

## 2016-02-20 NOTE — Progress Notes (Signed)
Subjective:     Patient ID: Stacy Horton, female   DOB: 03/11/1956, 60 y.o.   MRN: [id number]  HPI patient presents stating that she has had a lot of pain in her right heel for the last several months. States it has gradually gotten worse over that period of time and makes walking difficult   Review of Systems  All other systems reviewed and are negative.      Objective:   Physical Exam  Constitutional: She is oriented to person, place, and time.  Cardiovascular: Intact distal pulses.   Musculoskeletal: Normal range of motion.  Neurological: She is oriented to person, place, and time.  Skin: Skin is warm.  Nursing note and vitals reviewed.  neurovascular status intact muscle strength adequate range of motion within normal limits with patient found to have exquisite discomfort in the plantar aspect right heel insertional point tendon calcaneus with inflammation fluid around the medial band. I noted there to be moderate depression of the arch and patient has good digital perfusion and is well oriented 3     Assessment:     Inflammatory fasciitis right with fluid buildup around the medial band    Plan:     H&P and x-rays reviewed with patient. Today I went ahead and injected the medial band right heel 3 mg Kenalog 5 mg Xylocaine and applied fascial brace with instructions on usage. Gave instructions on physical therapy and placed patient on diclofenac 75 mg twice a day and shoe gear modification. Reappoint to recheck in 2 weeks  X-ray report indicated small spur with no indications of stress fracture or arthritis

## 2016-03-06 ENCOUNTER — Ambulatory Visit: Payer: BLUE CROSS/BLUE SHIELD | Admitting: Podiatry

## 2016-03-07 ENCOUNTER — Other Ambulatory Visit: Payer: Self-pay | Admitting: Cardiovascular Disease

## 2016-03-07 NOTE — Telephone Encounter (Signed)
Rx(s) sent to pharmacy electronically.  

## 2016-03-08 ENCOUNTER — Encounter: Payer: Self-pay | Admitting: Podiatry

## 2016-03-08 ENCOUNTER — Ambulatory Visit: Payer: Self-pay

## 2016-03-08 ENCOUNTER — Ambulatory Visit (INDEPENDENT_AMBULATORY_CARE_PROVIDER_SITE_OTHER): Payer: BLUE CROSS/BLUE SHIELD | Admitting: Podiatry

## 2016-03-08 DIAGNOSIS — M722 Plantar fascial fibromatosis: Secondary | ICD-10-CM | POA: Diagnosis not present

## 2016-03-08 MED ORDER — TRIAMCINOLONE ACETONIDE 10 MG/ML IJ SUSP
10.0000 mg | Freq: Once | INTRAMUSCULAR | Status: AC
  Administered 2016-03-08: 10 mg

## 2016-03-08 NOTE — Progress Notes (Signed)
Subjective:     Patient ID: Stacy Horton, female   DOB: 01/06/1956, 60 y.o.   MRN: [id number]  HPI patient presents stating that she still has heel pain but it's improving some but she does need foot support   Review of Systems     Objective:   Physical Exam Neurovascular status intact muscle strength adequate with discomfort still present in the heel with inflammation fluid buildup quite a bit improved over where it was    Assessment:     Plantar fasciitis still noted with improvement but pain    Plan:     H&P conditions reviewed and reinjected the plantar fascia 3 [place] Kenalog 5 mill grams Xylocaine. Scanned for custom orthotics to reduce plantar pressures

## 2016-03-29 ENCOUNTER — Ambulatory Visit: Payer: BLUE CROSS/BLUE SHIELD | Admitting: *Deleted

## 2016-03-29 DIAGNOSIS — M722 Plantar fascial fibromatosis: Secondary | ICD-10-CM

## 2016-03-29 NOTE — Progress Notes (Signed)
Patient ID: Stacy Horton, female   DOB: 06/23/1956, 60 y.o.   MRN: [id number] Patient presents for orthotic pick up.  Verbal and written break in and wear instructions given.  Patient will follow up in 4 weeks if symptoms worsen or fail to improve.

## 2016-03-29 NOTE — Patient Instructions (Signed)

## 2016-04-25 ENCOUNTER — Encounter: Payer: Self-pay | Admitting: Podiatry

## 2016-06-01 ENCOUNTER — Other Ambulatory Visit: Payer: Self-pay | Admitting: Cardiovascular Disease

## 2016-06-04 NOTE — Telephone Encounter (Signed)
Rx(s) sent to pharmacy electronically.  

## 2016-06-28 NOTE — Progress Notes (Signed)
Subjective:     Patient ID: Stacy Horton, female   DOB: 01/06/1956, 60 y.o.   MRN: [id number]  HPI patient states she's doing well with good alignment and minimal discomfort   Review of Systems     Objective:   Physical Exam Neurovascular status intact with patient found have well-healed coapted site and good range of motion with no crepitus    Assessment:     Doing well postoperatively    Plan:     X-ray reviewed and continued with physical therapy supportive shoes and reappoint as needed  X-rays indicate that the bunion is healing well with fixation in place and no movement

## 2016-07-22 ENCOUNTER — Other Ambulatory Visit: Payer: Self-pay | Admitting: Family Medicine

## 2016-07-22 DIAGNOSIS — Z1231 Encounter for screening mammogram for malignant neoplasm of breast: Secondary | ICD-10-CM

## 2016-08-13 ENCOUNTER — Ambulatory Visit
Admission: RE | Admit: 2016-08-13 | Discharge: 2016-08-13 | Disposition: A | Discharge status: Home / Self Care | Payer: BLUE CROSS/BLUE SHIELD | Source: Ambulatory Visit | Attending: Family Medicine | Admitting: Family Medicine

## 2016-08-13 DIAGNOSIS — Z1231 Encounter for screening mammogram for malignant neoplasm of breast: Secondary | ICD-10-CM

## 2016-08-29 ENCOUNTER — Other Ambulatory Visit: Payer: Self-pay | Admitting: Cardiovascular Disease

## 2016-09-17 ENCOUNTER — Telehealth: Payer: Self-pay | Admitting: Cardiovascular Disease

## 2016-09-17 NOTE — Telephone Encounter (Signed)
New message   Pt verbalized that she has been diagnosed with acid reflex   Pt c/o of Chest Pain: STAT if CP now or developed within 24 hours  1. Are you having CP right now? yes 2. Are you experiencing any other symptoms (ex. SOB, nausea, vomiting, sweating)? no 3. How long have you been experiencing CP? October 2017 4. Is your CP continuous or coming and going? Coming and going  5. Have you taken Nitroglycerin? no ?

## 2016-09-17 NOTE — Telephone Encounter (Signed)
Spoke with patient and she has shortness of breath with exertion at times Does not know if chest discomfort worse with exertion Scheduled appointment with Levander CampionBrittany S PA for tomorrow

## 2016-09-17 NOTE — Telephone Encounter (Signed)
Returned call to patient no answer.LMTC. 

## 2016-09-18 ENCOUNTER — Ambulatory Visit: Payer: Self-pay | Admitting: Student

## 2016-09-18 ENCOUNTER — Ambulatory Visit (INDEPENDENT_AMBULATORY_CARE_PROVIDER_SITE_OTHER): Payer: BLUE CROSS/BLUE SHIELD | Admitting: Student

## 2016-09-18 ENCOUNTER — Encounter: Payer: Self-pay | Admitting: Student

## 2016-09-18 VITALS — BP 118/74 | HR 74 | Ht 65.0 in | Wt 171.6 lb

## 2016-09-18 DIAGNOSIS — E7800 Pure hypercholesterolemia, unspecified: Secondary | ICD-10-CM | POA: Diagnosis not present

## 2016-09-18 DIAGNOSIS — I10 Essential (primary) hypertension: Secondary | ICD-10-CM | POA: Diagnosis not present

## 2016-09-18 DIAGNOSIS — E119 Type 2 diabetes mellitus without complications: Secondary | ICD-10-CM

## 2016-09-18 DIAGNOSIS — R0789 Other chest pain: Secondary | ICD-10-CM | POA: Diagnosis not present

## 2016-09-18 DIAGNOSIS — R0609 Other forms of dyspnea: Secondary | ICD-10-CM

## 2016-09-18 MED ORDER — NITROGLYCERIN 0.4 MG SL SUBL: 0.4000 mg | SUBLINGUAL_TABLET | SUBLINGUAL | 3 refills | Status: AC | PRN

## 2016-09-18 MED ORDER — LISINOPRIL-HYDROCHLOROTHIAZIDE 20-12.5 MG PO TABS: 2.0000 | ORAL_TABLET | Freq: Every day | ORAL | 3 refills | Status: DC

## 2016-09-18 MED ORDER — CARVEDILOL 12.5 MG PO TABS: 12.5000 mg | ORAL_TABLET | Freq: Two times a day (BID) | ORAL | 3 refills | Status: DC

## 2016-09-18 NOTE — Progress Notes (Signed)
Cardiology Office Note    Date:  09/18/2016   ID:  Stacy Horton, DOB 01/02/1956, MRN [id number]  PCP:  Garry Heateraleigh Rumley, DO  Cardiologist: Dr. Royann Shiversroitoru   Chief Complaint  Patient presents with  . Follow-up  . Chest Pain    pt states some chest pain and doesn't know if its acid reflux   . Shortness of Breath    states when walking she gets short winded     History of Present Illness:    Stacy Horton is a 60 y.o. female with past medical history of HTN, HLD, and Type 2 DM who presents to the office today for evaluation of chest pain and dyspnea.   She was last seen by Dr. Royann Shiversroitoru in 10/2015 for follow-up of her HTN and HLD. Reported having fatigue with Coreg and this was switched to Nebivolol 20mg  daily with her being continued on Lisinopril-HCTZ 20-12.5mg  BID. Lipid Panel showed total cholesterol 236, HDL 45, and LDL 173.  She has tried multiple statins throughout the years along with Zetia and has been unable to tolerate any of these. It was thought a PCSK9 inhibitor would be ideal but she does not meet criteria for this secondary to not having known CAD.    In talking with the patient today, she reports random episodes of chest discomfort. This is located along her epigastrium and feels more like a burning sensation. The pain can last for up to hours at a time and is partially relieved with her Prilosec. No associated radiating pain, nausea, vomiting, or diaphoresis.   She has noticed more dyspnea with exertion occurring over the past 2 months. Reports noticing this when walking around the store or climbing multiple flights of stairs. She questions whether or not this is related to deconditioning as she reports not being as active as she use to in the past. Her last stress test was in 2009 and showed no evidence of ischemia.   Past Medical History:  Diagnosis Date  . Chest pain    a. low-risk NST in 2009  . Diabetes mellitus   . Hemorrhoids   . Hyperlipidemia    a.  intolerant to multiple statins and Zetia  . Hypertension   . Lower extremity edema   . Obesity     Past Surgical History:  Procedure Laterality Date  . NM MYOVIEW LTD  03/15/2008   no ischemia  . US ECHOCARDIOGRAPHY  09/28/2010   normal    Current Medications: Outpatient Medications Prior to Visit  Medication Sig Dispense Refill  . diclofenac sodium (VOLTAREN) 1 % GEL Apply 2 g topically 4 (four) times daily. 100 g 3  . omeprazole (PRILOSEC) 20 MG capsule Take 1 capsule (20 mg total) by mouth daily. 30 capsule 0  . carvedilol (COREG) 12.5 MG tablet TAKE 1 TABLET (12.5 MG TOTAL) BY MOUTH 2 (TWO) TIMES DAILY. 60 tablet 5  . lisinopril-hydrochlorothiazide (PRINZIDE,ZESTORETIC) 20-12.5 MG tablet TAKE 2 TABLETS BY MOUTH DAILY. 60 tablet 1   No facility-administered medications prior to visit.      Allergies:   Ezetimibe; Percocet [oxycodone-acetaminophen]; and Statins   Social History   Social History  . Marital status: Divorced    Spouse name: N/A  . Number of children: N/A  . Years of education: N/A   Social History Main Topics  . Smoking status: Never Smoker  . Smokeless tobacco: Never Used  . Alcohol use Yes     Comment: occas  . Drug use: No  .  Sexual activity: Not Asked   Other Topics Concern  . None   Social History Narrative  . None     Family History:  The patient's family history includes CAD in her father.   Review of Systems:   Please see the history of present illness.     General:  No chills, fever, night sweats or weight changes.  Cardiovascular:  No edema, orthopnea, palpitations, paroxysmal nocturnal dyspnea. Positive for chest pain and dyspnea on exertion.  Dermatological: No rash, lesions/masses Respiratory: No cough, dyspnea Urologic: No hematuria, dysuria Abdominal:   No nausea, vomiting, diarrhea, bright red blood per rectum, melena, or hematemesis Neurologic:  No visual changes, wkns, changes in mental status. All other systems reviewed  and are otherwise negative except as noted above.   Physical Exam:    VS:  BP 118/74   Pulse 74   Ht 5\' 5"  (1.651 m)   Wt 171 lb 9.6 oz (77.8 kg)   BMI 28.56 kg/m    General: Well developed, well nourished,female appearing in no acute distress. Head: Normocephalic, atraumatic, sclera non-icteric, no xanthomas, nares are without discharge.  Neck: No carotid bruits. JVD not elevated.  Lungs: Respirations regular and unlabored, without wheezes or rales.  Heart: Regular rate and rhythm. No S3 or S4.  No murmur, no rubs, or gallops appreciated. Abdomen: Soft, non-tender, non-distended with normoactive bowel sounds. No hepatomegaly. No rebound/guarding. No obvious abdominal masses. Msk:  Strength and tone appear normal for age. No joint deformities or effusions. Extremities: No clubbing or cyanosis. No edema.  Distal pedal pulses are 2+ bilaterally. Neuro: Alert and oriented X 3. Moves all extremities spontaneously. No focal deficits noted. Psych:  Responds to questions appropriately with a normal affect. Skin: No rashes or lesions noted  Wt Readings from Last 3 Encounters:  09/18/16 171 lb 9.6 oz (77.8 kg)  10/30/15 174 lb 11.2 oz (79.2 kg)  10/23/15 174 lb (78.9 kg)     Studies/Labs Reviewed:   EKG:  EKG is ordered today.  The ekg ordered today demonstrates NSR, HR 74, with minimal TWI in leads V3-V5.   Recent Labs: 11/13/2015: ALT 14; BUN 15; Creat 0.83; Potassium 3.8; Sodium 139   Lipid Panel    Component Value Date/Time   CHOL 236 (H) 11/13/2015 1008   TRIG 92 11/13/2015 1008   HDL 45 (L) 11/13/2015 1008   CHOLHDL 5.2 (H) 11/13/2015 1008   VLDL 18 11/13/2015 1008   LDLCALC 173 (H) 11/13/2015 1008   LDLDIRECT 129 (H) 12/30/2008 1930    Additional studies/ records that were reviewed today include:   Echocardiogram: 2011   NST: 02/2008    Assessment:    1. Atypical chest pain   2. Dyspnea on exertion   3. Essential hypertension   4. Hypercholesterolemia   5.  Type 2 diabetes mellitus without complication, without long-term current use of insulin (HCC)      Plan:   In order of problems listed above:  1. Atypical Chest Pain/ Dyspnea on Exertion - reports having chest discomfort along her sternum and epigastrium which can last for hours at a time and is partially relieved with Prilosec and not made worse with exertion. This seems atypical for a cardiac etiology and most consistent with her known GERD. However she does report worsening dyspnea with exertion over the past 2 months which could be due to deconditioning however this is also concerning for an anginal equivalent as her EKG does show new TWI in leads V3-V5 and  she has multiple cardiac risk factors. We discussed further ischemic evaluation and with her mixed symptoms, will pursue a Lexiscan Myoview for initial assessment as her last stress test was in 2009. If ischemia is noted, she will need definitive evaluation with a cardiac catheterization.  - I provided her with an Rx for SL NTG today. She is aware that if she develops chest discomfort with exertion, or symptoms worsen acutely, then she should proceed to the ER for further evaluation.    2. HTN - BP well-controlled at 118/74 today.  - continue Lisinopril-HCTZ.   3. HLD - Lipid Panel in 11/2015 showed total cholesterol 236, HDL 45, and LDL 173. - unable to tolerate multiple statins in the past. Experienced myalgias with Zetia. Not a candidate for PCSK9 inhibitors due to no known history of CAD.   4. Type 2 DM - A1c 6.4 in 10/2015. Controlled with diet at this time. Not on any anti-glycemic medications.    Medication Adjustments/Labs and Tests Ordered: Current medicines are reviewed at length with the patient today.  Concerns regarding medicines are outlined above.  Medication changes, Labs and Tests ordered today are listed in the Patient Instructions below. Patient Instructions  Medication Instructions:  Your physician has  recommended you make the following change in your medication:  START Nitro-glycerin as needed as directed for chest pain  Lisinopril HCTZ and Carvediolol have been refilled today   Labwork: None ordered  Testing/Procedures: Your physician has requested that you have a lexiscan myoview. For further information please visit [email or web address]. Please follow instruction sheet, as given. ( Please schedule with in the next 2 weeks, any location)  Follow-Up: Your physician recommends that you schedule a follow-up appointment in: 2 months with Dr.Croitoru or sooner pending test results.  Any Other Special Instructions Will Be Listed Below (If Applicable).   If you need a refill on your cardiac medications before your next appointment, please call your pharmacy.  Signed, Ellsworth LennoxBrittany M Strader, PA  09/18/2016 5:10 PM    [facility] [address], [address] [place], [place]  [address] Phone: [phone]; Fax: [phone]  [address], [address] [place], [place] [address] Phone: [phone]

## 2016-09-18 NOTE — Progress Notes (Signed)
Agree. New expansion of FDA approved indications on Repatha may allow us to tyr this drug now. MCr

## 2016-09-18 NOTE — Patient Instructions (Addendum)
Medication Instructions:  Your physician has recommended you make the following change in your medication:  START Nitro-glycerin as needed as directed for chest pain  Lisinopril HCTZ and Carvediolol have been refilled today   Labwork: None ordered  Testing/Procedures: Your physician has requested that you have a lexiscan myoview. For further information please visit [email or web address]. Please follow instruction sheet, as given. ( Please schedule with in the next 2 weeks, any location)  Follow-Up: Your physician recommends that you schedule a follow-up appointment in: 2 months with Dr.Croitoru or sooner pending test results.  Any Other Special Instructions Will Be Listed Below (If Applicable).     If you need a refill on your cardiac medications before your next appointment, please call your pharmacy.   Nitroglycerin sublingual tablets What is this medicine? NITROGLYCERIN (nye troe GLI ser in) is a type of vasodilator. It relaxes blood vessels, increasing the blood and oxygen supply to your heart. This medicine is used to relieve chest pain caused by angina. It is also used to prevent chest pain before activities like climbing stairs, going outdoors in cold weather, or sexual activity. This medicine may be used for other purposes; ask your health care provider or pharmacist if you have questions. COMMON BRAND NAME(S): Nitroquick, Nitrostat, Nitrotab What should I tell my health care provider before I take this medicine? They need to know if you have any of these conditions: -anemia -head injury, recent stroke, or bleeding in the brain -liver disease -previous heart attack -an unusual or allergic reaction to nitroglycerin, other medicines, foods, dyes, or preservatives -pregnant or trying to get pregnant -breast-feeding How should I use this medicine? Take this medicine by mouth as needed. At the first sign of an angina attack (chest pain or tightness) place one tablet under  your tongue. You can also take this medicine 5 to 10 minutes before an event likely to produce chest pain. Follow the directions on the prescription label. Let the tablet dissolve under the tongue. Do not swallow whole. Replace the dose if you accidentally swallow it. It will help if your mouth is not dry. Saliva around the tablet will help it to dissolve more quickly. Do not eat or drink, smoke or chew tobacco while a tablet is dissolving. If you are not better within 5 minutes after taking ONE dose of nitroglycerin, call 9-1-1 immediately to seek emergency medical care. Do not take more than 3 nitroglycerin tablets over 15 minutes. If you take this medicine often to relieve symptoms of angina, your doctor or health care professional may provide you with different instructions to manage your symptoms. If symptoms do not go away after following these instructions, it is important to call 9-1-1 immediately. Do not take more than 3 nitroglycerin tablets over 15 minutes. Talk to your pediatrician regarding the use of this medicine in children. Special care may be needed. Overdosage: If you think you have taken too much of this medicine contact a poison control center or emergency room at once. NOTE: This medicine is only for you. Do not share this medicine with others. What if I miss a dose? This does not apply. This medicine is only used as needed. What may interact with this medicine? Do not take this medicine with any of the following medications: -certain migraine medicines like ergotamine and dihydroergotamine (DHE) -medicines used to treat erectile dysfunction like sildenafil, tadalafil, and vardenafil -riociguat This medicine may also interact with the following medications: -alteplase -aspirin -heparin -medicines for high blood pressure -medicines for  mental depression -other medicines used to treat angina -phenothiazines like chlorpromazine, mesoridazine, prochlorperazine, thioridazine This  list may not describe all possible interactions. Give your health care provider a list of all the medicines, herbs, non-prescription drugs, or dietary supplements you use. Also tell them if you smoke, drink alcohol, or use illegal drugs. Some items may interact with your medicine. What should I watch for while using this medicine? Tell your doctor or health care professional if you feel your medicine is no longer working. Keep this medicine with you at all times. Sit or lie down when you take your medicine to prevent falling if you feel dizzy or faint after using it. Try to remain calm. This will help you to feel better faster. If you feel dizzy, take several deep breaths and lie down with your feet propped up, or bend forward with your head resting between your knees. You may get drowsy or dizzy. Do not drive, use machinery, or do anything that needs mental alertness until you know how this drug affects you. Do not stand or sit up quickly, especially if you are an older patient. This reduces the risk of dizzy or fainting spells. Alcohol can make you more drowsy and dizzy. Avoid alcoholic drinks. Do not treat yourself for coughs, colds, or pain while you are taking this medicine without asking your doctor or health care professional for advice. Some ingredients may increase your blood pressure. What side effects may I notice from receiving this medicine? Side effects that you should report to your doctor or health care professional as soon as possible: -blurred vision -dry mouth -skin rash -sweating -the feeling of extreme pressure in the head -unusually weak or tired Side effects that usually do not require medical attention (report to your doctor or health care professional if they continue or are bothersome): -flushing of the face or neck -headache -irregular heartbeat, palpitations -nausea, vomiting This list may not describe all possible side effects. Call your doctor for medical advice about  side effects. You may report side effects to FDA at 1-800-FDA-1088. Where should I keep my medicine? Keep out of the reach of children. Store at room temperature between 20 and 25 degrees C (68 and 77 degrees F). Store in Retail buyeroriginal container. Protect from light and moisture. Keep tightly closed. Throw away any unused medicine after the expiration date. NOTE: This sheet is a summary. It may not cover all possible information. If you have questions about this medicine, talk to your doctor, pharmacist, or health care provider.  2017 Elsevier/Gold Standard (2013-07-15 17:57:36)

## 2016-09-24 ENCOUNTER — Telehealth (HOSPITAL_COMMUNITY): Payer: Self-pay

## 2016-09-24 NOTE — Telephone Encounter (Signed)
Encounter complete. 

## 2016-09-25 ENCOUNTER — Telehealth (HOSPITAL_COMMUNITY): Payer: Self-pay

## 2016-09-25 NOTE — Telephone Encounter (Signed)
Also, notified Nonie HoyerGina Compton of pt desire to cancel tomorrow. Almira CoasterGina will cancel this appointment.

## 2016-09-26 ENCOUNTER — Inpatient Hospital Stay (HOSPITAL_COMMUNITY): Admission: RE | Admit: 2016-09-26 | Payer: BLUE CROSS/BLUE SHIELD | Source: Ambulatory Visit

## 2016-09-29 ENCOUNTER — Other Ambulatory Visit: Payer: Self-pay | Admitting: Cardiovascular Disease

## 2016-10-11 ENCOUNTER — Telehealth (HOSPITAL_COMMUNITY): Payer: Self-pay

## 2016-10-11 NOTE — Telephone Encounter (Signed)
Encounter complete. 

## 2016-10-16 ENCOUNTER — Inpatient Hospital Stay (HOSPITAL_COMMUNITY): Admission: RE | Admit: 2016-10-16 | Payer: BLUE CROSS/BLUE SHIELD | Source: Ambulatory Visit

## 2016-10-22 ENCOUNTER — Telehealth (HOSPITAL_COMMUNITY): Payer: Self-pay

## 2016-10-22 NOTE — Telephone Encounter (Signed)
Encounter complete. 

## 2016-10-23 ENCOUNTER — Telehealth (HOSPITAL_COMMUNITY): Payer: Self-pay

## 2016-10-23 NOTE — Telephone Encounter (Signed)
Encounter complete. 

## 2016-10-24 ENCOUNTER — Ambulatory Visit (HOSPITAL_COMMUNITY)
Admission: RE | Admit: 2016-10-24 | Discharge: 2016-10-24 | Disposition: A | Discharge status: Home / Self Care | Payer: BLUE CROSS/BLUE SHIELD | Source: Ambulatory Visit | Attending: Student | Admitting: Student

## 2016-10-24 DIAGNOSIS — R0789 Other chest pain: Secondary | ICD-10-CM | POA: Diagnosis not present

## 2016-10-24 DIAGNOSIS — I259 Chronic ischemic heart disease, unspecified: Secondary | ICD-10-CM | POA: Insufficient documentation

## 2016-10-24 DIAGNOSIS — R0609 Other forms of dyspnea: Secondary | ICD-10-CM | POA: Diagnosis not present

## 2016-10-24 DIAGNOSIS — R9439 Abnormal result of other cardiovascular function study: Secondary | ICD-10-CM | POA: Insufficient documentation

## 2016-10-24 LAB — MYOCARDIAL PERFUSION IMAGING
CHL CUP NUCLEAR SDS: 5
CHL CUP NUCLEAR SRS: 0
CHL CUP RESTING HR STRESS: 57 {beats}/min
CSEPPHR: 72 {beats}/min
LV dias vol: 93 mL (ref 46–106)
LV sys vol: 43 mL
NUC STRESS TID: 1.23
SSS: 5

## 2016-10-24 MED ORDER — TECHNETIUM TC 99M TETROFOSMIN IV KIT
10.2000 | PACK | Freq: Once | INTRAVENOUS | Status: AC | PRN
  Administered 2016-10-24: 10.2 via INTRAVENOUS
  Filled 2016-10-24: qty 11

## 2016-10-24 MED ORDER — TECHNETIUM TC 99M TETROFOSMIN IV KIT
29.0000 | PACK | Freq: Once | INTRAVENOUS | Status: AC | PRN
  Administered 2016-10-24: 29 via INTRAVENOUS
  Filled 2016-10-24: qty 29

## 2016-10-24 MED ORDER — REGADENOSON 0.4 MG/5ML IV SOLN
0.4000 mg | Freq: Once | INTRAVENOUS | Status: AC
  Administered 2016-10-24: 0.4 mg via INTRAVENOUS

## 2016-10-25 ENCOUNTER — Telehealth: Payer: Self-pay | Admitting: Cardiovascular Disease

## 2016-10-25 NOTE — Telephone Encounter (Signed)
Returned call to patient regarding her stress test results (high risk findings). Discussed in detail. She's a little bit nervous concerning the findings. Patient had questions regarding catheterization. I gave guidance as able, but she may benefit from a phone call from Dr. 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 I did confirm appt for next week and she's aware she'll need to come for this appt. Is it possible she can get a call later this evening?  Also, do we need to go ahead and schedule cath?

## 2016-10-25 NOTE — Telephone Encounter (Signed)
Let's try to bring her in sooner. I prefer to speak to her face to face, rather than by phone. Can she come in Tuesday morning or afternoon? I am reader A and will be here all day. MCr

## 2016-10-25 NOTE — Telephone Encounter (Signed)
Patient called back, I've spoken w her and she states she'll be done w her route at about 9:30am Tuesday. Agreeable to coming in at 10am for OV. I've made this appt and she's aware of details.

## 2016-10-25 NOTE — Telephone Encounter (Signed)
Discussed w Chelley. OK to schedule end of AM on Tuesday.  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  Called patient, goes to VM. I have left patient a message to call back.

## 2016-10-25 NOTE — Telephone Encounter (Signed)
°  Follow Up  Calling to follow up on test results. OK to leave results on voicemail. Please call.

## 2016-10-29 ENCOUNTER — Telehealth: Payer: Self-pay | Admitting: Cardiovascular Disease

## 2016-10-29 ENCOUNTER — Ambulatory Visit: Payer: BLUE CROSS/BLUE SHIELD | Admitting: Cardiovascular Disease

## 2016-10-29 NOTE — Telephone Encounter (Signed)
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 Wanted to see if the nurse could work her in sooner, possibly today or later in the week. Requesting call back

## 2016-10-29 NOTE — Telephone Encounter (Signed)
No appts now until April-left detailed mess for pt

## 2016-10-29 NOTE — Telephone Encounter (Signed)
Follow Up   Patient said she was returning phone call from [place].

## 2016-11-01 ENCOUNTER — Ambulatory Visit: Payer: Self-pay | Admitting: Cardiovascular Disease

## 2016-11-05 ENCOUNTER — Encounter: Payer: Self-pay | Admitting: Cardiovascular Disease

## 2016-11-05 ENCOUNTER — Ambulatory Visit (INDEPENDENT_AMBULATORY_CARE_PROVIDER_SITE_OTHER): Payer: BLUE CROSS/BLUE SHIELD | Admitting: Cardiovascular Disease

## 2016-11-05 ENCOUNTER — Ambulatory Visit
Admission: RE | Admit: 2016-11-05 | Discharge: 2016-11-05 | Disposition: A | Discharge status: Home / Self Care | Payer: BLUE CROSS/BLUE SHIELD | Source: Ambulatory Visit | Attending: Cardiovascular Disease | Admitting: Cardiovascular Disease

## 2016-11-05 VITALS — BP 172/84 | HR 72 | Ht 65.0 in | Wt 173.6 lb

## 2016-11-05 DIAGNOSIS — Z01812 Encounter for preprocedural laboratory examination: Secondary | ICD-10-CM

## 2016-11-05 DIAGNOSIS — I10 Essential (primary) hypertension: Secondary | ICD-10-CM | POA: Diagnosis not present

## 2016-11-05 DIAGNOSIS — R9439 Abnormal result of other cardiovascular function study: Secondary | ICD-10-CM

## 2016-11-05 DIAGNOSIS — Z789 Other specified health status: Secondary | ICD-10-CM

## 2016-11-05 DIAGNOSIS — I25118 Atherosclerotic heart disease of native coronary artery with other forms of angina pectoris: Secondary | ICD-10-CM

## 2016-11-05 DIAGNOSIS — D689 Coagulation defect, unspecified: Secondary | ICD-10-CM

## 2016-11-05 DIAGNOSIS — E119 Type 2 diabetes mellitus without complications: Secondary | ICD-10-CM

## 2016-11-05 DIAGNOSIS — E7800 Pure hypercholesterolemia, unspecified: Secondary | ICD-10-CM

## 2016-11-05 DIAGNOSIS — R5383 Other fatigue: Secondary | ICD-10-CM

## 2016-11-05 DIAGNOSIS — IMO0001 Reserved for inherently not codable concepts without codable children: Secondary | ICD-10-CM

## 2016-11-05 LAB — BASIC METABOLIC PANEL
BUN: 12 mg/dL (ref 7–25)
CHLORIDE: 103 mmol/L (ref 98–110)
CO2: 28 mmol/L (ref 20–31)
CREATININE: 0.8 mg/dL (ref 0.50–0.99)
Calcium: 9.7 mg/dL (ref 8.6–10.4)
Glucose, Bld: 127 mg/dL — ABNORMAL HIGH (ref 65–99)
POTASSIUM: 4.1 mmol/L (ref 3.5–5.3)
SODIUM: 138 mmol/L (ref 135–146)

## 2016-11-05 LAB — CBC
HCT: 36.7 % (ref 35.0–45.0)
Hemoglobin: 12.2 g/dL (ref 11.7–15.5)
MCH: 29.9 pg (ref 27.0–33.0)
MCHC: 33.2 g/dL (ref 32.0–36.0)
MCV: 90 fL (ref 80.0–100.0)
MPV: 9.6 fL (ref 7.5–12.5)
PLATELETS: 219 10*3/uL (ref 140–400)
RBC: 4.08 MIL/uL (ref 3.80–5.10)
RDW: 13.6 % (ref 11.0–15.0)
WBC: 4.8 10*3/uL (ref 3.8–10.8)

## 2016-11-05 LAB — TSH: TSH: 0.84 m[IU]/L

## 2016-11-05 NOTE — Progress Notes (Signed)
Patient ID: Stacy CarrowSheliah A Myint, female   DOB: 04/16/1956, 61 y.o.   MRN: [id number]    Cardiology Office Note    Date:  11/05/2016   ID:  Stacy CarrowSheliah A Shill, DOB 05/18/1956, MRN [id number]  PCP:  Garry Heateraleigh Rumley, DO  Cardiologist:   Thurmon FairMihai Sheyanne Munley, MD   Chief Complaint  Patient presents with  . Stress test f/u    pt c/o SOB when going up steps; swelling in LE; no other Sx.    History of Present Illness:  Stacy Horton is a 61 y.o. female with systemic hypertension, type 2 diabetes mellitus (diet controlled) and hyperlipidemia without known structural heart disease who presents for follow-up after undergoing a nuclear stress test with abnormal results.  She has a relatively recent onset exertional dyspnea, but denies angina at rest or with activity.  The nuclear study shows a extensive and reversible defect involving the entire territory of the LAD, as well as a second reversible defect that corresponds to the territory of the right coronary artery. The EF was borderline at 54%. These abnormalities are completely new when compared with the study from 2009.  She has been intolerant to multiple statins and Zetia. She has well-controlled diabetes mellitus and her blood pressure is usually well treated. Today she is nervous and her blood pressure is high.  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   Past Medical History:  Diagnosis Date  . Chest pain    a. low-risk NST in 2009  . Diabetes mellitus   . Hemorrhoids   . Hyperlipidemia    a. intolerant to multiple statins and Zetia  . Hypertension   . Lower extremity edema   . Obesity     Past Surgical History:  Procedure Laterality Date  . NM MYOVIEW LTD  03/15/2008   no ischemia  . US ECHOCARDIOGRAPHY  09/28/2010   normal    Outpatient Medications Prior to Visit  Medication Sig Dispense Refill  . carvedilol (COREG) 12.5 MG tablet Take 1 tablet (12.5 mg total) by mouth 2 (two) times daily.  180 tablet 3  . diclofenac sodium (VOLTAREN) 1 % GEL Apply 2 g topically 4 (four) times daily. 100 g 3  . lisinopril-hydrochlorothiazide (PRINZIDE,ZESTORETIC) 20-12.5 MG tablet Take 2 tablets by mouth daily. 180 tablet 3  . nitroGLYCERIN (NITROSTAT) 0.4 MG SL tablet Place 1 tablet (0.4 mg total) under the tongue every 5 (five) minutes as needed. 25 tablet 3  . omeprazole (PRILOSEC) 20 MG capsule Take 1 capsule (20 mg total) by mouth daily. 30 capsule 0  . valACYclovir (VALTREX) 500 MG tablet Take 500 mg by mouth daily.  0   No facility-administered medications prior to visit.      Allergies:   Ezetimibe; Percocet [oxycodone-acetaminophen]; and Statins   Social History   Social History  . Marital status: Divorced    Spouse name: N/A  . Number of children: N/A  . Years of education: N/A   Social History Main Topics  . Smoking status: Never Smoker  . Smokeless tobacco: Never Used  . Alcohol use Yes     Comment: occas  . Drug use: No  . Sexual activity: Not Asked   Other Topics Concern  . None   Social History Narrative  . None      ROS:   Please see the history of present illness.    ROS All other systems reviewed and are negative.   PHYSICAL EXAM:   VS:  BP Marland Kitchen(!)  172/84 (BP Location: Left Arm, Patient Position: Sitting, Cuff Size: Normal)   Pulse 72   Ht 5\' 5"  (1.651 m)   Wt 78.7 kg (173 lb 9.6 oz)   BMI 28.89 kg/m    GEN: Well nourished, well developed, in no acute distress  HEENT: normal  Neck: no JVD, carotid bruits, or masses Cardiac: RRR; no murmurs, rubs, or gallops,no edema  Respiratory:  clear to auscultation bilaterally, normal work of breathing GI: soft, nontender, nondistended, + BS MS: no deformity or atrophy  Skin: warm and dry, no rash Neuro:  Alert and Oriented x 3, Strength and sensation are intact Psych: euthymic mood, full affect  Wt Readings from Last 3 Encounters:  11/05/16 78.7 kg (173 lb 9.6 oz)  10/24/16 77.6 kg (171 lb)  09/18/16 77.8  kg (171 lb 9.6 oz)      Studies/Labs Reviewed:   EKG:  EKG is ordered today.  The ekg ordered today demonstrates normal sinus rhythm, QS pattern in V1-V2, mild repolarization abnormalities in leads 3, F and V1-V3, similar to December 20 tracing, but new from previous tracings  Recent Labs: 11/13/2015: ALT 14; BUN 15; Creat 0.83; Potassium 3.8; Sodium 139   Lipid Panel    Component Value Date/Time   CHOL 236 (H) 11/13/2015 1008   TRIG 92 11/13/2015 1008   HDL 45 (L) 11/13/2015 1008   CHOLHDL 5.2 (H) 11/13/2015 1008   VLDL 18 11/13/2015 1008   LDLCALC 173 (H) 11/13/2015 1008   LDLDIRECT 129 (H) 12/30/2008 1930    ASSESSMENT:    1. Coronary artery disease of native artery of native heart with stable angina pectoris (HCC)   2. Essential hypertension   3. Hypercholesterolemia   4. Diabetes mellitus type 2, diet-controlled (HCC)   5. Pre-procedure lab exam   6. Blood clotting disorder (HCC)   7. Abnormal cardiovascular stress test   8. Other fatigue   9. Patient is Jehovah's Witness      PLAN:  In order of problems listed above:  1. CAD: Mrs. Shiffer has recent onset exertional dyspnea (Which is almost certainly an angina equivalent), new changes on the resting ECG suggestive of ischemia and a high risk nuclear stress test suggestive of coronary insufficiency involving 2 separate and large coronary territories. She should undergo coronary angiography. It's possible she will require bypass surgery, but the threshold for recommending surgery is high since she refuses blood transfusions. If possible revascularization should be performed with angioplasty and stent placement. This procedure has been fully reviewed with the patient and written informed consent has been obtained. 2. HTN: Her blood pressure today is quite high, but typically at home it is 1:30/80. She is clearly upset and worried about the results of her stress test. No changes are made to her medications. 3. HLP: She  has tried to take both water-soluble and lipid soluble statins without success. She did not even tolerate Zetia. If we discover that she has established coronary disease, she would definitely meet criteria for PCS K9 inhibitors. Will get an updated lipid profile with her preprocedure labs. 4. DM: Reports good glycemic control without medications. 5. Jehovah's witness    Medication Adjustments/Labs and Tests Ordered: Current medicines are reviewed at length with the patient today.  Concerns regarding medicines are outlined above.  Medication changes, Labs and Tests ordered today are listed in the Patient Instructions below. Patient Instructions  Your physician has requested that you have a cardiac catheterization on Friday, February 9th, 2018 with Dr Cristal Deer  End. Cardiac catheterization is used to diagnose and/or treat various heart conditions. Doctors may recommend this procedure for a number of different reasons. The most common reason is to evaluate chest pain. Chest pain can be a symptom of coronary artery disease (CAD), and cardiac catheterization can show whether plaque is narrowing or blocking your heart's arteries. This procedure is also used to evaluate the valves, as well as measure the blood flow and oxygen levels in different parts of your heart. For further information please visit [email or web address]   Following your catheterization, you will not be allowed to drive for 3 days.  No lifting, pushing, or pulling greater that 10 pounds is allowed for 1 week.  You will be required to have the following tests prior to the procedure:  1. Blood work - the blood work can be done no more than 7 days prior to the procedure. It can be done at any [facility] lab. There is one downstairs on the first floor of this building and one in the [facility] building [phone]).  2. Chest Xray - the chest xray order has already been placed at the [facility]  Building.         Signed, Thurmon Fair, MD  11/05/2016 1:30 PM    [facility] [address] [place], [place], [place]  [address] Phone: [phone]; Fax: [phone]

## 2016-11-05 NOTE — Patient Instructions (Signed)
Your physician has requested that you have a cardiac catheterization on Friday, February 9th, 2018 with Dr Cristal Deerhristopher End. Cardiac catheterization is used to diagnose and/or treat various heart conditions. Doctors may recommend this procedure for a number of different reasons. The most common reason is to evaluate chest pain. Chest pain can be a symptom of coronary artery disease (CAD), and cardiac catheterization can show whether plaque is narrowing or blocking your heart's arteries. This procedure is also used to evaluate the valves, as well as measure the blood flow and oxygen levels in different parts of your heart. For further information please visit [email or web address].   Following your catheterization, you will not be allowed to drive for 3 days.  No lifting, pushing, or pulling greater that 10 pounds is allowed for 1 week.  You will be required to have the following tests prior to the procedure:  1. Blood work - the blood work can be done no more than 7 days prior to the procedure. It [facility] be done at any [facility] lab. There is one downstairs on the first floor of this building and one in the [facility] building [phone]([address]).  2. Chest Xray - the chest xray order has already been placed at the [facility].

## 2016-11-06 LAB — APTT: APTT: 26 s (ref 22–34)

## 2016-11-06 LAB — PROTIME-INR
INR: 1
Prothrombin Time: 10.6 s (ref 9.0–11.5)

## 2016-11-07 ENCOUNTER — Other Ambulatory Visit: Payer: Self-pay | Admitting: Cardiovascular Disease

## 2016-11-08 ENCOUNTER — Encounter (HOSPITAL_COMMUNITY): Payer: Self-pay | Admitting: General Practice

## 2016-11-08 ENCOUNTER — Ambulatory Visit (HOSPITAL_COMMUNITY)
Admission: RE | Admit: 2016-11-08 | Discharge: 2016-11-09 | Disposition: A | Discharge status: Home / Self Care | Payer: BLUE CROSS/BLUE SHIELD | Source: Ambulatory Visit | Attending: Internal Medicine | Admitting: Internal Medicine

## 2016-11-08 ENCOUNTER — Encounter (HOSPITAL_COMMUNITY)
Admission: RE | Disposition: A | Discharge status: Home / Self Care | Payer: Self-pay | Source: Ambulatory Visit | Attending: Internal Medicine

## 2016-11-08 DIAGNOSIS — E785 Hyperlipidemia, unspecified: Secondary | ICD-10-CM | POA: Diagnosis not present

## 2016-11-08 DIAGNOSIS — I2510 Atherosclerotic heart disease of native coronary artery without angina pectoris: Secondary | ICD-10-CM | POA: Diagnosis present

## 2016-11-08 DIAGNOSIS — Z6828 Body mass index (BMI) 28.0-28.9, adult: Secondary | ICD-10-CM | POA: Insufficient documentation

## 2016-11-08 DIAGNOSIS — I25118 Atherosclerotic heart disease of native coronary artery with other forms of angina pectoris: Secondary | ICD-10-CM | POA: Insufficient documentation

## 2016-11-08 DIAGNOSIS — D689 Coagulation defect, unspecified: Secondary | ICD-10-CM | POA: Diagnosis not present

## 2016-11-08 DIAGNOSIS — K2100 Gastro-esophageal reflux disease with esophagitis, without bleeding: Secondary | ICD-10-CM | POA: Diagnosis present

## 2016-11-08 DIAGNOSIS — Z885 Allergy status to narcotic agent status: Secondary | ICD-10-CM | POA: Insufficient documentation

## 2016-11-08 DIAGNOSIS — R9439 Abnormal result of other cardiovascular function study: Secondary | ICD-10-CM

## 2016-11-08 DIAGNOSIS — I2584 Coronary atherosclerosis due to calcified coronary lesion: Secondary | ICD-10-CM | POA: Diagnosis not present

## 2016-11-08 DIAGNOSIS — I200 Unstable angina: Secondary | ICD-10-CM | POA: Diagnosis present

## 2016-11-08 DIAGNOSIS — Z955 Presence of coronary angioplasty implant and graft: Secondary | ICD-10-CM

## 2016-11-08 DIAGNOSIS — I10 Essential (primary) hypertension: Secondary | ICD-10-CM | POA: Diagnosis not present

## 2016-11-08 DIAGNOSIS — R079 Chest pain, unspecified: Secondary | ICD-10-CM | POA: Diagnosis present

## 2016-11-08 DIAGNOSIS — E669 Obesity, unspecified: Secondary | ICD-10-CM | POA: Insufficient documentation

## 2016-11-08 DIAGNOSIS — E7800 Pure hypercholesterolemia, unspecified: Secondary | ICD-10-CM | POA: Diagnosis not present

## 2016-11-08 DIAGNOSIS — E119 Type 2 diabetes mellitus without complications: Secondary | ICD-10-CM | POA: Diagnosis not present

## 2016-11-08 HISTORY — DX: Atherosclerotic heart disease of native coronary artery without angina pectoris: I25.10

## 2016-11-08 HISTORY — PX: CORONARY STENT INTERVENTION: CATH118234

## 2016-11-08 HISTORY — DX: Gastro-esophageal reflux disease without esophagitis: K21.9

## 2016-11-08 HISTORY — DX: Procedure and treatment not carried out because of patient's decision for reasons of belief and group pressure: Z53.1

## 2016-11-08 HISTORY — PX: CORONARY ANGIOPLASTY WITH STENT PLACEMENT: SHX49

## 2016-11-08 HISTORY — PX: LEFT HEART CATH AND CORONARY ANGIOGRAPHY: CATH118249

## 2016-11-08 HISTORY — DX: Type 2 diabetes mellitus without complications: E11.9

## 2016-11-08 HISTORY — DX: Irritable bowel syndrome, unspecified: K58.9

## 2016-11-08 HISTORY — DX: Reserved for inherently not codable concepts without codable children: IMO0001

## 2016-11-08 LAB — CBC
HEMATOCRIT: 27.7 % — AB (ref 36.0–46.0)
HEMOGLOBIN: 8.9 g/dL — AB (ref 12.0–15.0)
MCH: 29.8 pg (ref 26.0–34.0)
MCHC: 32.1 g/dL (ref 30.0–36.0)
MCV: 92.6 fL (ref 78.0–100.0)
Platelets: 158 10*3/uL (ref 150–400)
RBC: 2.99 MIL/uL — AB (ref 3.87–5.11)
RDW: 13 % (ref 11.5–15.5)
WBC: 4.5 10*3/uL (ref 4.0–10.5)

## 2016-11-08 LAB — CREATININE, SERUM: CREATININE: 0.52 mg/dL (ref 0.44–1.00)

## 2016-11-08 LAB — GLUCOSE, CAPILLARY
GLUCOSE-CAPILLARY: 131 mg/dL — AB (ref 65–99)
GLUCOSE-CAPILLARY: 82 mg/dL (ref 65–99)
GLUCOSE-CAPILLARY: 127 mg/dL — AB (ref 65–99)

## 2016-11-08 LAB — POCT ACTIVATED CLOTTING TIME
Activated Clotting Time: 241 seconds
Activated Clotting Time: 290 seconds

## 2016-11-08 SURGERY — LEFT HEART CATH AND CORONARY ANGIOGRAPHY

## 2016-11-08 MED ORDER — SODIUM CHLORIDE 0.9% FLUSH: 3.0000 mL | INTRAVENOUS | Status: DC | PRN

## 2016-11-08 MED ORDER — FENTANYL CITRATE (PF) 100 MCG/2ML IJ SOLN
INTRAMUSCULAR | Status: DC | PRN
  Administered 2016-11-08: 50 ug via INTRAVENOUS
  Administered 2016-11-08 (×2): 25 ug via INTRAVENOUS

## 2016-11-08 MED ORDER — HEPARIN SODIUM (PORCINE) 5000 UNIT/ML IJ SOLN
5000.0000 [IU] | Freq: Three times a day (TID) | INTRAMUSCULAR | Status: DC
  Administered 2016-11-08 – 2016-11-09 (×2): 5000 [IU] via SUBCUTANEOUS
  Filled 2016-11-08 (×2): qty 1

## 2016-11-08 MED ORDER — IOPAMIDOL (ISOVUE-370) INJECTION 76%
INTRAVENOUS | Status: AC
  Filled 2016-11-08: qty 50

## 2016-11-08 MED ORDER — NITROGLYCERIN 0.4 MG SL SUBL: 0.4000 mg | SUBLINGUAL_TABLET | SUBLINGUAL | Status: DC | PRN

## 2016-11-08 MED ORDER — SODIUM CHLORIDE 0.9% FLUSH: 3.0000 mL | Freq: Two times a day (BID) | INTRAVENOUS | Status: DC

## 2016-11-08 MED ORDER — IOPAMIDOL (ISOVUE-370) INJECTION 76%
INTRAVENOUS | Status: AC
  Filled 2016-11-08: qty 100

## 2016-11-08 MED ORDER — PANTOPRAZOLE SODIUM 40 MG PO TBEC: 40.0000 mg | DELAYED_RELEASE_TABLET | Freq: Every day | ORAL | Status: DC

## 2016-11-08 MED ORDER — HEPARIN SODIUM (PORCINE) 1000 UNIT/ML IJ SOLN
INTRAMUSCULAR | Status: AC
Start: 2016-11-08 — End: 2016-11-08
  Filled 2016-11-08: qty 1

## 2016-11-08 MED ORDER — VALACYCLOVIR HCL 500 MG PO TABS
500.0000 mg | ORAL_TABLET | Freq: Every day | ORAL | Status: DC
  Administered 2016-11-09: 500 mg via ORAL
  Filled 2016-11-08: qty 1

## 2016-11-08 MED ORDER — PANTOPRAZOLE SODIUM 40 MG PO TBEC
40.0000 mg | DELAYED_RELEASE_TABLET | Freq: Every day | ORAL | Status: DC
Start: 2016-11-08 — End: 2016-11-09
  Administered 2016-11-08 – 2016-11-09 (×2): 40 mg via ORAL
  Filled 2016-11-08 (×2): qty 1

## 2016-11-08 MED ORDER — DOCUSATE SODIUM 100 MG PO CAPS
100.0000 mg | ORAL_CAPSULE | Freq: Every day | ORAL | Status: DC
  Administered 2016-11-08 – 2016-11-09 (×2): 100 mg via ORAL
  Filled 2016-11-08 (×2): qty 1

## 2016-11-08 MED ORDER — LISINOPRIL-HYDROCHLOROTHIAZIDE 20-12.5 MG PO TABS: 2.0000 | ORAL_TABLET | Freq: Every day | ORAL | Status: DC

## 2016-11-08 MED ORDER — SODIUM CHLORIDE 0.9 % WEIGHT BASED INFUSION: 3.0000 mL/kg/h | INTRAVENOUS | Status: DC

## 2016-11-08 MED ORDER — ASPIRIN 81 MG PO CHEW
81.0000 mg | CHEWABLE_TABLET | Freq: Every day | ORAL | Status: DC
  Administered 2016-11-09: 09:00:00 81 mg via ORAL
  Filled 2016-11-08: qty 1

## 2016-11-08 MED ORDER — LIDOCAINE HCL (PF) 1 % IJ SOLN
INTRAMUSCULAR | Status: AC
  Filled 2016-11-08: qty 30

## 2016-11-08 MED ORDER — VERAPAMIL HCL 2.5 MG/ML IV SOLN
INTRAVENOUS | Status: DC | PRN
  Administered 2016-11-08: 10 mL via INTRA_ARTERIAL

## 2016-11-08 MED ORDER — SODIUM CHLORIDE 0.9 % IV SOLN: 250.0000 mL | INTRAVENOUS | Status: DC | PRN

## 2016-11-08 MED ORDER — SODIUM CHLORIDE 0.9 % WEIGHT BASED INFUSION: 1.0000 mL/kg/h | INTRAVENOUS | Status: DC

## 2016-11-08 MED ORDER — LISINOPRIL 40 MG PO TABS
40.0000 mg | ORAL_TABLET | Freq: Every day | ORAL | Status: DC
  Administered 2016-11-09: 09:00:00 40 mg via ORAL
  Filled 2016-11-08: qty 1

## 2016-11-08 MED ORDER — HEPARIN SODIUM (PORCINE) 1000 UNIT/ML IJ SOLN
INTRAMUSCULAR | Status: AC
  Filled 2016-11-08: qty 1

## 2016-11-08 MED ORDER — CARVEDILOL 12.5 MG PO TABS
12.5000 mg | ORAL_TABLET | Freq: Two times a day (BID) | ORAL | Status: DC
  Administered 2016-11-08 – 2016-11-09 (×2): 12.5 mg via ORAL
  Filled 2016-11-08 (×2): qty 1

## 2016-11-08 MED ORDER — HYDRALAZINE HCL 20 MG/ML IJ SOLN: 5.0000 mg | INTRAMUSCULAR | Status: AC | PRN

## 2016-11-08 MED ORDER — ONDANSETRON HCL 4 MG/2ML IJ SOLN: 4.0000 mg | Freq: Four times a day (QID) | INTRAMUSCULAR | Status: DC | PRN

## 2016-11-08 MED ORDER — NITROGLYCERIN 1 MG/10 ML FOR IR/CATH LAB
INTRA_ARTERIAL | Status: AC
  Filled 2016-11-08: qty 10

## 2016-11-08 MED ORDER — SODIUM CHLORIDE 0.9 % IV SOLN: INTRAVENOUS | Status: AC

## 2016-11-08 MED ORDER — HEPARIN (PORCINE) IN NACL 2-0.9 UNIT/ML-% IJ SOLN
INTRAMUSCULAR | Status: DC | PRN
Start: 2016-11-08 — End: 2016-11-08
  Administered 2016-11-08: 1000 mL

## 2016-11-08 MED ORDER — HYDROCHLOROTHIAZIDE 25 MG PO TABS
25.0000 mg | ORAL_TABLET | Freq: Every day | ORAL | Status: DC
  Administered 2016-11-09: 09:00:00 25 mg via ORAL
  Filled 2016-11-08: qty 1

## 2016-11-08 MED ORDER — CLOPIDOGREL BISULFATE 300 MG PO TABS
ORAL_TABLET | ORAL | Status: AC
  Filled 2016-11-08: qty 1

## 2016-11-08 MED ORDER — LABETALOL HCL 5 MG/ML IV SOLN: 10.0000 mg | INTRAVENOUS | Status: AC | PRN

## 2016-11-08 MED ORDER — ASPIRIN 81 MG PO CHEW: 81.0000 mg | CHEWABLE_TABLET | ORAL | Status: DC

## 2016-11-08 MED ORDER — CLOPIDOGREL BISULFATE 300 MG PO TABS
ORAL_TABLET | ORAL | Status: DC | PRN
  Administered 2016-11-08: 600 mg via ORAL

## 2016-11-08 MED ORDER — PRASUGREL HCL 10 MG PO TABS
ORAL_TABLET | ORAL | Status: AC
  Filled 2016-11-08: qty 1

## 2016-11-08 MED ORDER — CLOPIDOGREL BISULFATE 75 MG PO TABS
75.0000 mg | ORAL_TABLET | Freq: Every day | ORAL | Status: DC
Start: 2016-11-09 — End: 2016-11-09
  Administered 2016-11-09: 09:00:00 75 mg via ORAL
  Filled 2016-11-08: qty 1

## 2016-11-08 MED ORDER — IOPAMIDOL (ISOVUE-370) INJECTION 76%
INTRAVENOUS | Status: DC | PRN
  Administered 2016-11-08: 105 mL via INTRA_ARTERIAL

## 2016-11-08 MED ORDER — ASPIRIN 81 MG PO CHEW
CHEWABLE_TABLET | ORAL | Status: AC
  Filled 2016-11-08: qty 4

## 2016-11-08 MED ORDER — ROSUVASTATIN CALCIUM 10 MG PO TABS
5.0000 mg | ORAL_TABLET | ORAL | Status: DC
  Administered 2016-11-09: 09:00:00 5 mg via ORAL
  Filled 2016-11-08: qty 1

## 2016-11-08 MED ORDER — HEPARIN (PORCINE) IN NACL 2-0.9 UNIT/ML-% IJ SOLN
INTRAMUSCULAR | Status: AC
  Filled 2016-11-08: qty 1000

## 2016-11-08 MED ORDER — ANGIOPLASTY BOOK
Freq: Once | Status: AC
  Administered 2016-11-08: 22:00:00 1
  Filled 2016-11-08: qty 1

## 2016-11-08 MED ORDER — FENTANYL CITRATE (PF) 100 MCG/2ML IJ SOLN
INTRAMUSCULAR | Status: AC
  Filled 2016-11-08: qty 2

## 2016-11-08 MED ORDER — NITROGLYCERIN 1 MG/10 ML FOR IR/CATH LAB
INTRA_ARTERIAL | Status: DC | PRN
  Administered 2016-11-08 (×2): 200 ug via INTRACORONARY

## 2016-11-08 MED ORDER — LIDOCAINE HCL (PF) 1 % IJ SOLN
INTRAMUSCULAR | Status: DC | PRN
  Administered 2016-11-08: 2 mL via SUBCUTANEOUS

## 2016-11-08 MED ORDER — MIDAZOLAM HCL 2 MG/2ML IJ SOLN
INTRAMUSCULAR | Status: DC | PRN
  Administered 2016-11-08 (×2): 1 mg via INTRAVENOUS

## 2016-11-08 MED ORDER — VERAPAMIL HCL 2.5 MG/ML IV SOLN
INTRAVENOUS | Status: AC
  Filled 2016-11-08: qty 2

## 2016-11-08 MED ORDER — MIDAZOLAM HCL 2 MG/2ML IJ SOLN
INTRAMUSCULAR | Status: AC
  Filled 2016-11-08: qty 2

## 2016-11-08 MED ORDER — ACETAMINOPHEN 325 MG PO TABS: 650.0000 mg | ORAL_TABLET | ORAL | Status: DC | PRN

## 2016-11-08 MED ORDER — HEPARIN SODIUM (PORCINE) 1000 UNIT/ML IJ SOLN
INTRAMUSCULAR | Status: DC | PRN
  Administered 2016-11-08: 4000 [IU] via INTRAVENOUS
  Administered 2016-11-08: 1000 [IU] via INTRAVENOUS
  Administered 2016-11-08: 4000 [IU] via INTRAVENOUS
  Administered 2016-11-08: 2000 [IU] via INTRAVENOUS

## 2016-11-08 MED ORDER — POLYETHYLENE GLYCOL 3350 17 G PO PACK
17.0000 g | PACK | Freq: Every day | ORAL | Status: DC
  Administered 2016-11-08: 22:00:00 17 g via ORAL
  Filled 2016-11-08: qty 1

## 2016-11-08 MED ORDER — ALPRAZOLAM 0.25 MG PO TABS
0.2500 mg | ORAL_TABLET | Freq: Every day | ORAL | Status: DC
  Administered 2016-11-08: 22:00:00 0.25 mg via ORAL
  Filled 2016-11-08: qty 1

## 2016-11-08 MED ORDER — ASPIRIN 81 MG PO CHEW
CHEWABLE_TABLET | ORAL | Status: AC
  Administered 2016-11-08: 81 mg
  Filled 2016-11-08: qty 1

## 2016-11-08 SURGICAL SUPPLY — 18 items
BALLN EMERGE MR 2.25X12 (BALLOONS) ×3
BALLN ~~LOC~~ EUPHORA RX 3.5X12 (BALLOONS) ×3
BALLOON EMERGE MR 2.25X12 (BALLOONS) ×1 IMPLANT
BALLOON ~~LOC~~ EUPHORA RX 3.5X12 (BALLOONS) ×1 IMPLANT
CATH IMPULSE 5F ANG/FL3.5 (CATHETERS) ×3 IMPLANT
CATH VISTA GUIDE 6FR XB3 (CATHETERS) ×3 IMPLANT
DEVICE RAD COMP TR BAND LRG (VASCULAR PRODUCTS) ×3 IMPLANT
GLIDESHEATH SLEND SS 6F .021 (SHEATH) ×3 IMPLANT
GUIDEWIRE INQWIRE 1.5J.035X260 (WIRE) ×1 IMPLANT
INQWIRE 1.5J .035X260CM (WIRE) ×3
KIT ENCORE 26 ADVANTAGE (KITS) ×3 IMPLANT
KIT HEART LEFT (KITS) ×3 IMPLANT
PACK CARDIAC CATHETERIZATION (CUSTOM PROCEDURE TRAY) ×3 IMPLANT
STENT SYNERGY DES 3X16 (Permanent Stent) ×3 IMPLANT
TRANSDUCER W/STOPCOCK (MISCELLANEOUS) ×3 IMPLANT
TUBING CIL FLEX 10 FLL-RA (TUBING) ×3 IMPLANT
WIRE ASAHI PROWATER 180CM (WIRE) ×3 IMPLANT
WIRE HI TORQ BMW 190CM (WIRE) ×3 IMPLANT

## 2016-11-08 NOTE — Progress Notes (Signed)
TR BAND REMOVAL  LOCATION:    right radial  DEFLATED PER PROTOCOL:    Yes.    TIME BAND OFF / DRESSING APPLIED:    1930   SITE UPON ARRIVAL:    Level 0  SITE AFTER BAND REMOVAL:    Level 0  CIRCULATION SENSATION AND MOVEMENT:    Within Normal Limits   Yes.     

## 2016-11-08 NOTE — Care Management Note (Signed)
Case Management Note  Patient Details  Name: Verne CarrowSheliah A Elsbernd MRN: [id number] Date of Birth: 05/11/1956  Subjective/Objective:    S/p coronary stent intervention, will be on plavix and asa, NCM will cont to follow for dc needs.                 Action/Plan:   Expected Discharge Date:                  Expected Discharge Plan:  Home/Self Care  In-House Referral:     Discharge planning Services  CM Consult  Post Acute Care Choice:    Choice offered to:     DME Arranged:    DME Agency:     HH Arranged:    HH Agency:     Status of Service:  Completed, signed off  If discussed at [employer] Length of Stay Meetings, dates discussed:    Additional Comments:  Leone Havenaylor, Nyaja [place], RN 11/08/2016, 3:29 PM

## 2016-11-08 NOTE — Brief Op Note (Signed)
Brief Cardiac Catheterization Note (Full Report to Follow)  Date: 11/08/2016 Time: 3:07 PM  PATIENT:  Stacy Horton  61 y.o. female  PRE-OPERATIVE DIAGNOSIS:  abnormal stress test  POST-OPERATIVE DIAGNOSIS:  Single-vessel coronary artery disease  PROCEDURE:  Procedure(s): Left Heart Cath and Coronary Angiography (N/A) Coronary Stent Intervention (N/A)  SURGEON:  Surgeon(s) and Role:    * Yvonne Kendallhristopher Adriyana Greenbaum, MD - Primary  FINDINGS: 1.  Severe single vessel coronary artery disease with 95% mid LAD stenosis just distal to takeoff of moderate-caliber diagonal branch. 2.  Mild luminal irregularities involving LCx and RCA. 3.  Mildly elevated left ventricular filling pressure. 4.  Successful PCI to mid LAD with placement of Synergy 3.0 x 16 mm DES (post-dilated with 3.5 mm Hunter balloon). There is 50% ostial diagonal stenosis post-PCI to LAD (30% pre-PCI).  RECOMMENDATIONS: 1.  Admit for overnight extended recovery. 2.  Dual antiplatelet therapy with aspirin and clopidogrel for at least 6 months, longer if possible. 3.  Aggressive secondary prevention.  Yvonne Kendallhristopher Jalesha Plotz, MD [facility] Pager: [phone]

## 2016-11-08 NOTE — H&P (View-Only) (Signed)
Patient ID: Stacy Horton, female   DOB: 04/16/1956, 61 y.o.   MRN: [id number]    Cardiology Office Note    Date:  11/05/2016   ID:  Stacy Horton, DOB 05/18/1956, MRN [id number]  PCP:  Garry Heateraleigh Rumley, DO  Cardiologist:   Thurmon FairMihai Pernell Lenoir, MD   Chief Complaint  Patient presents with  . Stress test f/u    pt c/o SOB when going up steps; swelling in LE; no other Sx.    History of Present Illness:  Stacy Horton is a 61 y.o. female with systemic hypertension, type 2 diabetes mellitus (diet controlled) and hyperlipidemia without known structural heart disease who presents for follow-up after undergoing a nuclear stress test with abnormal results.  She has a relatively recent onset exertional dyspnea, but denies angina at rest or with activity.  The nuclear study shows a extensive and reversible defect involving the entire territory of the LAD, as well as a second reversible defect that corresponds to the territory of the right coronary artery. The EF was borderline at 54%. These abnormalities are completely new when compared with the study from 2009.  She has been intolerant to multiple statins and Zetia. She has well-controlled diabetes mellitus and her blood pressure is usually well treated. Today she is nervous and her blood pressure is high.  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   Past Medical History:  Diagnosis Date  . Chest pain    a. low-risk NST in 2009  . Diabetes mellitus   . Hemorrhoids   . Hyperlipidemia    a. intolerant to multiple statins and Zetia  . Hypertension   . Lower extremity edema   . Obesity     Past Surgical History:  Procedure Laterality Date  . NM MYOVIEW LTD  03/15/2008   no ischemia  . US ECHOCARDIOGRAPHY  09/28/2010   normal    Outpatient Medications Prior to Visit  Medication Sig Dispense Refill  . carvedilol (COREG) 12.5 MG tablet Take 1 tablet (12.5 mg total) by mouth 2 (two) times daily.  180 tablet 3  . diclofenac sodium (VOLTAREN) 1 % GEL Apply 2 g topically 4 (four) times daily. 100 g 3  . lisinopril-hydrochlorothiazide (PRINZIDE,ZESTORETIC) 20-12.5 MG tablet Take 2 tablets by mouth daily. 180 tablet 3  . nitroGLYCERIN (NITROSTAT) 0.4 MG SL tablet Place 1 tablet (0.4 mg total) under the tongue every 5 (five) minutes as needed. 25 tablet 3  . omeprazole (PRILOSEC) 20 MG capsule Take 1 capsule (20 mg total) by mouth daily. 30 capsule 0  . valACYclovir (VALTREX) 500 MG tablet Take 500 mg by mouth daily.  0   No facility-administered medications prior to visit.      Allergies:   Ezetimibe; Percocet [oxycodone-acetaminophen]; and Statins   Social History   Social History  . Marital status: Divorced    Spouse name: N/A  . Number of children: N/A  . Years of education: N/A   Social History Main Topics  . Smoking status: Never Smoker  . Smokeless tobacco: Never Used  . Alcohol use Yes     Comment: occas  . Drug use: No  . Sexual activity: Not Asked   Other Topics Concern  . None   Social History Narrative  . None      ROS:   Please see the history of present illness.    ROS All other systems reviewed and are negative.   PHYSICAL EXAM:   VS:  BP Marland Kitchen(!)  172/84 (BP Location: Left Arm, Patient Position: Sitting, Cuff Size: Normal)   Pulse 72   Ht 5\' 5"  (1.651 m)   Wt 78.7 kg (173 lb 9.6 oz)   BMI 28.89 kg/m    GEN: Well nourished, well developed, in no acute distress  HEENT: normal  Neck: no JVD, carotid bruits, or masses Cardiac: RRR; no murmurs, rubs, or gallops,no edema  Respiratory:  clear to auscultation bilaterally, normal work of breathing GI: soft, nontender, nondistended, + BS MS: no deformity or atrophy  Skin: warm and dry, no rash Neuro:  Alert and Oriented x 3, Strength and sensation are intact Psych: euthymic mood, full affect  Wt Readings from Last 3 Encounters:  11/05/16 78.7 kg (173 lb 9.6 oz)  10/24/16 77.6 kg (171 lb)  09/18/16 77.8  kg (171 lb 9.6 oz)      Studies/Labs Reviewed:   EKG:  EKG is ordered today.  The ekg ordered today demonstrates normal sinus rhythm, QS pattern in V1-V2, mild repolarization abnormalities in leads 3, F and V1-V3, similar to December 20 tracing, but new from previous tracings  Recent Labs: 11/13/2015: ALT 14; BUN 15; Creat 0.83; Potassium 3.8; Sodium 139   Lipid Panel    Component Value Date/Time   CHOL 236 (H) 11/13/2015 1008   TRIG 92 11/13/2015 1008   HDL 45 (L) 11/13/2015 1008   CHOLHDL 5.2 (H) 11/13/2015 1008   VLDL 18 11/13/2015 1008   LDLCALC 173 (H) 11/13/2015 1008   LDLDIRECT 129 (H) 12/30/2008 1930    ASSESSMENT:    1. Coronary artery disease of native artery of native heart with stable angina pectoris (HCC)   2. Essential hypertension   3. Hypercholesterolemia   4. Diabetes mellitus type 2, diet-controlled (HCC)   5. Pre-procedure lab exam   6. Blood clotting disorder (HCC)   7. Abnormal cardiovascular stress test   8. Other fatigue   9. Patient is Jehovah's Witness      PLAN:  In order of problems listed above:  1. CAD: Mrs. Horton has recent onset exertional dyspnea (Which is almost certainly an angina equivalent), new changes on the resting ECG suggestive of ischemia and a high risk nuclear stress test suggestive of coronary insufficiency involving 2 separate and large coronary territories. She should undergo coronary angiography. It's possible she will require bypass surgery, but the threshold for recommending surgery is high since she refuses blood transfusions. If possible revascularization should be performed with angioplasty and stent placement. This procedure has been fully reviewed with the patient and written informed consent has been obtained. 2. HTN: Her blood pressure today is quite high, but typically at home it is 1:30/80. She is clearly upset and worried about the results of her stress test. No changes are made to her medications. 3. HLP: She  has tried to take both water-soluble and lipid soluble statins without success. She did not even tolerate Zetia. If we discover that she has established coronary disease, she would definitely meet criteria for PCS K9 inhibitors. Will get an updated lipid profile with her preprocedure labs. 4. DM: Reports good glycemic control without medications. 5. Jehovah's witness    Medication Adjustments/Labs and Tests Ordered: Current medicines are reviewed at length with the patient today.  Concerns regarding medicines are outlined above.  Medication changes, Labs and Tests ordered today are listed in the Patient Instructions below. Patient Instructions  Your physician has requested that you have a cardiac catheterization on Friday, February 9th, 2018 with Dr Cristal Deer  End. Cardiac catheterization is used to diagnose and/or treat various heart conditions. Doctors may recommend this procedure for a number of different reasons. The most common reason is to evaluate chest pain. Chest pain can be a symptom of coronary artery disease (CAD), and cardiac catheterization can show whether plaque is narrowing or blocking your heart's arteries. This procedure is also used to evaluate the valves, as well as measure the blood flow and oxygen levels in different parts of your heart. For further information please visit [email or web address]   Following your catheterization, you will not be allowed to drive for 3 days.  No lifting, pushing, or pulling greater that 10 pounds is allowed for 1 week.  You will be required to have the following tests prior to the procedure:  1. Blood work - the blood work can be done no more than 7 days prior to the procedure. It can be done at any [facility] lab. There is one downstairs on the first floor of this building and one in the [facility] building [phone]).  2. Chest Xray - the chest xray order has already been placed at the [facility]  Building.         Signed, Thurmon Fair, MD  11/05/2016 1:30 PM    [facility] [address] [place], [place], [place]  [address] Phone: [phone]; Fax: [phone]

## 2016-11-08 NOTE — Interval H&P Note (Signed)
History and Physical Interval Note:  11/08/2016 1:58 PM  Stacy Horton  has presented today for cardiac catheterization, with the diagnosis of chest pain and abnormal stress test  The various methods of treatment have been discussed with the patient and family. After consideration of risks, benefits and other options for treatment, the patient has consented to  Procedure(s): Left Heart Cath and Coronary Angiography (N/A) as a surgical intervention .  The patient's history has been reviewed, patient examined, no change in status, stable for surgery.  I have reviewed the patient's chart and labs.  Questions were answered to the patient's satisfaction.    Cath Lab Visit (complete for each Cath Lab visit)  Clinical Evaluation Leading to the Procedure:   ACS: No.  Non-ACS:    Anginal Classification: CCS III  Anti-ischemic medical therapy: Minimal Therapy (1 class of medications)  Non-Invasive Test Results: High-risk stress test findings: cardiac mortality >3%/year  Prior CABG: No previous CABG  Stacy Horton

## 2016-11-09 ENCOUNTER — Encounter (HOSPITAL_COMMUNITY): Payer: Self-pay | Admitting: Physician Assistant

## 2016-11-09 DIAGNOSIS — R9439 Abnormal result of other cardiovascular function study: Secondary | ICD-10-CM | POA: Diagnosis not present

## 2016-11-09 DIAGNOSIS — I2584 Coronary atherosclerosis due to calcified coronary lesion: Secondary | ICD-10-CM | POA: Diagnosis not present

## 2016-11-09 DIAGNOSIS — I25119 Atherosclerotic heart disease of native coronary artery with unspecified angina pectoris: Secondary | ICD-10-CM

## 2016-11-09 DIAGNOSIS — I2510 Atherosclerotic heart disease of native coronary artery without angina pectoris: Secondary | ICD-10-CM | POA: Diagnosis present

## 2016-11-09 DIAGNOSIS — E7800 Pure hypercholesterolemia, unspecified: Secondary | ICD-10-CM | POA: Diagnosis not present

## 2016-11-09 DIAGNOSIS — I25118 Atherosclerotic heart disease of native coronary artery with other forms of angina pectoris: Secondary | ICD-10-CM | POA: Diagnosis not present

## 2016-11-09 DIAGNOSIS — E119 Type 2 diabetes mellitus without complications: Secondary | ICD-10-CM | POA: Diagnosis not present

## 2016-11-09 LAB — BASIC METABOLIC PANEL
Anion gap: 8 (ref 5–15)
BUN: 8 mg/dL (ref 6–20)
CHLORIDE: 102 mmol/L (ref 101–111)
CO2: 26 mmol/L (ref 22–32)
Calcium: 9.2 mg/dL (ref 8.9–10.3)
Creatinine, Ser: 0.84 mg/dL (ref 0.44–1.00)
GFR calc Af Amer: >60 mL/min (ref >60)
GFR calc non Af Amer: >60 mL/min (ref >60)
GLUCOSE: 104 mg/dL — AB (ref 65–99)
POTASSIUM: 3.6 mmol/L (ref 3.5–5.1)
Sodium: 136 mmol/L (ref 135–145)

## 2016-11-09 LAB — CBC
HEMATOCRIT: 33.5 % — AB (ref 36.0–46.0)
Hemoglobin: 10.9 g/dL — ABNORMAL LOW (ref 12.0–15.0)
MCH: 29.7 pg (ref 26.0–34.0)
MCHC: 32.5 g/dL (ref 30.0–36.0)
MCV: 91.3 fL (ref 78.0–100.0)
Platelets: 211 10*3/uL (ref 150–400)
RBC: 3.67 MIL/uL — ABNORMAL LOW (ref 3.87–5.11)
RDW: 13 % (ref 11.5–15.5)
WBC: 6.1 10*3/uL (ref 4.0–10.5)

## 2016-11-09 LAB — GLUCOSE, CAPILLARY: Glucose-Capillary: 114 mg/dL — ABNORMAL HIGH (ref 65–99)

## 2016-11-09 MED ORDER — ASPIRIN 81 MG PO CHEW: 81.0000 mg | CHEWABLE_TABLET | Freq: Every day | ORAL | Status: AC

## 2016-11-09 MED ORDER — PANTOPRAZOLE SODIUM 40 MG PO TBEC: 40.0000 mg | DELAYED_RELEASE_TABLET | Freq: Every day | ORAL | 3 refills | Status: DC

## 2016-11-09 MED ORDER — ROSUVASTATIN CALCIUM 5 MG PO TABS: 5.0000 mg | ORAL_TABLET | ORAL | 1 refills | Status: DC

## 2016-11-09 MED ORDER — CLOPIDOGREL BISULFATE 75 MG PO TABS: 75.0000 mg | ORAL_TABLET | Freq: Every day | ORAL | 4 refills | Status: DC

## 2016-11-09 NOTE — Discharge Instructions (Signed)

## 2016-11-09 NOTE — Discharge Summary (Signed)
Discharge Summary    Patient ID: [name],  MRN: [id number], DOB/AGE: Sep 21, 1956 61 y.o.  Admit date: 11/08/2016 Discharge date: 11/09/2016  Primary Care Provider: Garry Horton Primary Cardiologist: Stacy Fair, MD    Discharge Diagnoses    Principal Problem:   Abnormal cardiovascular stress test Active Problems:   Diabetes mellitus type 2, diet-controlled (HCC)   Hypercholesterolemia   Essential hypertension   Reflux esophagitis   Coronary artery disease   Allergies Allergies  Allergen Reactions  . Ezetimibe     REACTION: myalgias  . Statins     Tried, Atorvastatin, Crestor, Pravastatin, Simvastatin = all caused same problem     History of Present Illness     Stacy Horton is a 61 y.o. female with a history of HTN, diet controlled DMT2, HLD, and no previous cardiac history who presented to [facility] on 11/08/16 for planned cardiac cath.   She was seen in the office for relatively recent onset exertional dyspnea. She underwent nuclear stress testing on 10/24/16 which showed an extensive and reversible defect involving the entire territory of the LAD, as well as a second reversible defect that corresponds to the territory of the right coronary artery. She saw Dr. Royann Shivers back in the office on 11/05/16 and was set up for coronary angiography on 11/08/16.  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 She has also has been intolerant to many different statin drugs and Zetia.   Hospital Course     Consultants: none  CAD: LHC with 95% mLAD s/p DES, mod non obst dz in dLAD and OM1. Started on DAPT with ASA and plavix. Added low dose once weekly Crestor (long history of statin intolerance). Continue coreg.   HTN: BP well controlled currently  HLD: as above willing to try low dose Crestor once weekly. Most recent LDL 11/2015 was 173. If she does not tolerate this, she may need to be referred for [facility] therapy. Will need lipid  panel/LFTs in 4-6 weeks.   Jehovah's Witness: refuses blood transfusions.   GERD: omeprazole discontinued given theoretical interaction with plavix. SHe will be discharged on protonix.   DMT2: diet controlled. HgA1c 6.1 last year.   The patient has had an uncomplicated hospital course and is recovering well. The radial catheter site is stable. She has been seen by Dr. Elease Hashimoto today and deemed ready for discharge home. All follow-up appointments have been scheduled. A work excuse note was provided as well. Discharge medications are listed below.  _____________  Discharge Vitals Blood pressure 134/64, pulse 64, temperature 97.8 F (36.6 C), temperature source Oral, resp. rate 18, height 5\' 5"  (1.651 m), weight 173 lb (78.5 kg), SpO2 96 %.  Filed Weights   11/08/16 0845  Weight: 173 lb (78.5 kg)   GEN: Well nourished, well developed, in no acute distress  HEENT: normal  Neck: no JVD, carotid bruits, or masses Cardiac: RRR; no murmurs, rubs, or gallops,no edema  Respiratory:  clear to auscultation bilaterally, normal work of breathing GI: soft, nontender, nondistended, + BS MS: no deformity or atrophy  Skin: warm and dry, no rash Neuro:  Alert and Oriented x 3, Strength and sensation are intact Psych: euthymic mood, full affect  Labs & Radiologic Studies     CBC  Recent Labs  11/08/16 1547 11/09/16 0352  WBC 4.5 6.1  HGB 8.9* 10.9*  HCT 27.7* 33.5*  MCV 92.6 91.3  PLT 158 211   Basic Metabolic Panel  Recent Labs  11/08/16 1547 11/09/16 0352  NA  --  136  K  --  3.6  CL  --  102  CO2  --  26  GLUCOSE  --  104*  BUN  --  8  CREATININE 0.52 0.84  CALCIUM  --  9.2    Dg Chest 2 View  Result Date: 11/05/2016 CLINICAL DATA:  Chest pain, shortness of Breath EXAM: CHEST  2 VIEW COMPARISON:  08/19/2007 FINDINGS: Heart and mediastinal contours are within normal limits. No focal opacities or effusions. No acute bony abnormality. IMPRESSION: No active cardiopulmonary  disease. Electronically Signed   By: Stacy Horton M.D.   On: 11/05/2016 14:12     Diagnostic Studies/Procedures    Cath 11/08/16 Coronary Stent Intervention  Left Heart Cath and Coronary Angiography   Conclusions: 1. Significant single-vessel coronary artery disease with 95% stenosis of the mid LAD immediately distal to D1. D1 also has approximately 30% ostial stenosis, increased to 50% post PCI to the LAD secondary to plaque shift. 2. Mild to moderate, nonobstructive CAD involving the distal LAD and large OM1 branch. 3. Mildly elevated left ventricular filling pressure. 4. Successful PCI to the mid LAD with placement of a Synergy 3.0 x 16 mm drug-eluting stent, postdilated with a 3.5 mm noncompliant balloon at high pressure. Recommendations: 1. Admit for overnight extended recovery. 2. TR band protocol. 3. Dual antiplatelet therapy with aspirin and clopidogrel for at least 6 months, ideally longer. 4. Aggressive secondary prevention including retrial of statin therapy versus initiation of PCSK9 inhibitor. Patient has agreed to a trial of once weekly low-dose rosuvastatin.   _____________    Disposition   Pt is being discharged home today in good condition.  Follow-up Plans & Appointments    Follow-up Information    Stacy Horton, [place]. Go on 11/21/2016.   Specialties:  Physician Assistant, Cardiology Why:  @ 11 am (please arrive at least 10 minutes early).  Contact information: [address] [place] [address] [phone]            Discharge Medications     Medication List    STOP taking these medications   omeprazole 40 MG capsule Commonly known as:  PRILOSEC     TAKE these medications   aspirin 81 MG chewable tablet Chew 1 tablet (81 mg total) by mouth daily.   Biotin 5000 MCG Tabs Take 1 tablet by mouth daily.   carvedilol 12.5 MG tablet Commonly known as:  COREG Take 1 tablet (12.5 mg total) by mouth 2 (two) times daily.     chlorhexidine 0.12 % solution Commonly known as:  PERIDEX Use as directed 15 mLs in the mouth or throat 2 (two) times daily.   clopidogrel 75 MG tablet Commonly known as:  PLAVIX Take 1 tablet (75 mg total) by mouth daily with breakfast.   docusate sodium 100 MG capsule Commonly known as:  COLACE Take 100 mg by mouth daily.   lisinopril-hydrochlorothiazide 20-12.5 MG tablet Commonly known as:  PRINZIDE,ZESTORETIC Take 2 tablets by mouth daily.   nitroGLYCERIN 0.4 MG SL tablet Commonly known as:  NITROSTAT Place 1 tablet (0.4 mg total) under the tongue every 5 (five) minutes as needed.   pantoprazole 40 MG tablet Commonly known as:  PROTONIX Take 1 tablet (40 mg total) by mouth daily.   polyethylene glycol packet Commonly known as:  MIRALAX / GLYCOLAX Take 17 g by mouth daily.   rosuvastatin 5 MG tablet Commonly known as:  CRESTOR Take 1 tablet (5 mg total) by mouth once a week.   valACYclovir 500 MG tablet Commonly known as:  VALTREX Take 500 mg by mouth daily.   Vitamin D3 2000 units Chew Chew 1 each by mouth daily.         Outstanding Labs/Studies   Lipid/LFTs   Duration of Discharge Encounter   Greater than 30 minutes including physician time.  Signed, Cline CrockKathryn Thompson PA-C 11/09/2016, 8:50 AM    Attending Note:   The patient was seen and examined.  Agree with assessment and plan as noted above.  Changes made to the above note as needed.  Patient seen and independently examined with Carlean JewsKatie Thompson, PA .   We discussed all aspects of the encounter. I agree with the assessment and plan as stated above.  1.  Coronary artery disease: She is status post PTCA and stenting of her left anterior descending artery. She has done well. She will ambulate with cardiac rehabilitation today.  She'll follow-up in the office.    I have spent a total of 40 minutes with patient reviewing hospital  notes , telemetry, EKGs, labs and examining patient as well as  establishing an assessment and plan that was discussed with the patient. > 50% of time was spent in direct patient care.    Vesta MixerPhilip J. Nahser, Montez HagemanJr., MD, [facility] 11/09/2016, 9:15 AM [address],  [address] Office [phone] Pager [phone]

## 2016-11-09 NOTE — Progress Notes (Signed)
CARDIAC REHAB PHASE I   PRE:  Rate/Rhythm: 81 SR  BP:  Supine:   Sitting: 141/78  Standing:    SaO2:   MODE:  Ambulation: 1000 ft   POST:  Rate/Rhythm:   BP:  Supine:  Sitting:148/80   Standing:    SaO2:  0855-1010 Pt tolerated ambulation well, she c/o of slight SOB walking. No c/o of any chest pain. VS stable. Completed discharge education with pt and sister. Pt voices understanding. [place].  Melina CopaLisa Vianny Schraeder RN 11/09/2016 10:13 AM

## 2016-11-11 ENCOUNTER — Telehealth: Payer: Self-pay | Admitting: Cardiovascular Disease

## 2016-11-11 ENCOUNTER — Encounter (HOSPITAL_COMMUNITY): Payer: Self-pay | Admitting: Internal Medicine

## 2016-11-11 MED FILL — Prasugrel HCl Tab 10 MG (Base Equiv): ORAL | Qty: 1 | Status: AC

## 2016-11-11 NOTE — Telephone Encounter (Signed)
Clopidogrel has < 1% incidence of headache, but with new stent placement, she needs to be on this (or Brilinta) for at least a year.   No indication that pantoprazole can cause HA.  Would suggest she continue with her meds, see if it will resolve over the next week or two.  If unbearable, she will need to contact us before stopping clopidogrel.

## 2016-11-11 NOTE — Telephone Encounter (Signed)
Left msg to call.

## 2016-11-11 NOTE — Telephone Encounter (Signed)
Returning your call. °

## 2016-11-11 NOTE — Telephone Encounter (Signed)
Spoke to patient and gave her recommendations on continuation of current medical therapy. Advised to follow up as scheduled, call if new concerns. She voiced understanding and thanks.

## 2016-11-11 NOTE — Telephone Encounter (Signed)
[facility].  Patient understands to follow up with provider B Strader [facility] Patient understands discharge instructions? Yes Patient understands medications and regiment? Yes Patient understands to bring all medications to this visit? Yes

## 2016-11-11 NOTE — Telephone Encounter (Signed)
TOC Phone Call appt is on 11/21/16 at 11am w/ Randall AnBrittany Strader

## 2016-11-11 NOTE — Telephone Encounter (Signed)
Patient had concern, notes that since coming home from hospital she's had headaches in the evening. Concerned that this might be related to a new medication.s She was started on plavix, protonix, and NTG leaving hospital. She has not needed to use the NTG but is aware this can cause headache. Wondering if plavix or protonix could potentially be at issue. Aware I'll seek review by pharmD and return her call.

## 2016-11-18 ENCOUNTER — Telehealth: Payer: Self-pay | Admitting: Cardiovascular Disease

## 2016-11-18 NOTE — Telephone Encounter (Signed)
Spoke to patient, she endorses 1x coin sized bruise on back. Advised that bruising common w plavix, recommended care as bruising can more easily occur w bumping into objects. Aware if further concerns to address at upcoming appt (Thursday). Concerns addressed to patient's satisfaction, she voiced understanding and thanks.

## 2016-11-18 NOTE — Telephone Encounter (Signed)
New message    Pt c/o medication issue:  1. Name of Medication:  clopidogrel (PLAVIX) 75 MG tablet Take 1 tablet (75 mg total) by mouth daily with breakfast     2. How are you currently taking this medication (dosage and times per day)? 1 tablet + an asprin  3. Are you having a reaction (difficulty breathing--STAT)? no  4. What is your medication issue? Has a bruise on her back the size of a 50 cent piece

## 2016-11-20 NOTE — Progress Notes (Signed)
Cardiology Office Note    Date:  11/21/2016   ID:  Stacy Horton 01-22-1956, MRN [id number]  PCP:  Garry Heater, DO  Cardiologist: Dr. Royann Shivers  Chief Complaint  Patient presents with  . Follow-up    a. recent cardiac cath    History of Present Illness:    Stacy Horton is a 61 y.o. female with past medical history of HTN, HLD, Type 2 DM, and abnormal NST in 09/2016 who presents to the office today for hospital follow-up.   Was recently admitted from 2/9 - 11/09/2016 for a planned cardiac catheterization in regards to her abnormal stress test.  Cath showed 95% stenosis along the mid-LAD and nonobstructive disease in the D1, distal LAD, and OM1. A 3.0 x 16 mm Synergy DES was placed to the mid-LAD and she was started on DAPT with ASA and Plavix. She has a history of statin intolerance and was started on once weekly Crestor (will need repeat Lipid/LFT's in 6 weeks). She tolerated the procedure well and no complications were noted.   In talking with the patient today, she reports significant improvement in her chest discomfort and shortness of breath following recent cardiac catheterization. She has experienced 1 to 2 episodes of chest pain since her cath but says these were mild in comparison to prior episodes and resolve within a matter of seconds.  No complications concerning her right radial cath site. Reports good compliance with her medication regimen including ASA, Coreg, Plavix, Lisinopril, and Crestor. Denies any evidence of active bleeding. She has only been taking 2.5 mg of Crestor weekly. She is hesitant to increase this to 5 mg dosing. Lipid Panel in 11/2015 showed total cholesterol 236, HDL 45, and LDL 173.   Past Medical History:  Diagnosis Date  . Coronary artery disease    a. 10/2016: LHC with 95% mLAD s/p DES, mod non obst dz in dLAD and OM1  . GERD (gastroesophageal reflux disease)   . Hemorrhoids   . Hyperlipidemia    a. intolerant to multiple  statins and Zetia  . Hypertension   . IBS (irritable bowel syndrome)   . Lower extremity edema   . Obesity   . Refusal of blood transfusions as patient is Jehovah's Witness   . Type II diabetes mellitus (HCC)     Past Surgical History:  Procedure Laterality Date  . ABDOMINAL HYSTERECTOMY  1990s  . BREAST BIOPSY Left ~ 2006/2007   "it was nothing"  . CARPAL TUNNEL RELEASE Bilateral 2011-2012  . CORONARY ANGIOPLASTY WITH STENT PLACEMENT  11/08/2016  . CORONARY STENT INTERVENTION N/A 11/08/2016   Procedure: Coronary Stent Intervention;  Surgeon: Yvonne Kendall, MD;  Location: [facility] INVASIVE CV LAB;  Service: Cardiovascular;  Laterality: N/A;  . LEFT HEART CATH AND CORONARY ANGIOGRAPHY N/A 11/08/2016   Procedure: Left Heart Cath and Coronary Angiography;  Surgeon: Yvonne Kendall, MD;  Location: [facility] INVASIVE CV LAB;  Service: Cardiovascular;  Laterality: N/A;  . NM MYOVIEW LTD  03/15/2008   no ischemia  . US ECHOCARDIOGRAPHY  09/28/2010   normal    Current Medications: Outpatient Medications Prior to Visit  Medication Sig Dispense Refill  . aspirin 81 MG chewable tablet Chew 1 tablet (81 mg total) by mouth daily.    . Biotin 5000 MCG TABS Take 1 tablet by mouth daily.    . carvedilol (COREG) 12.5 MG tablet Take 1 tablet (12.5 mg total) by mouth 2 (two) times daily. 180 tablet 3  . chlorhexidine (PERIDEX) 0.12 %  solution Use as directed 15 mLs in the mouth or throat 2 (two) times daily.    . Cholecalciferol (VITAMIN D3) 2000 units CHEW Chew 1 each by mouth daily.    . clopidogrel (PLAVIX) 75 MG tablet Take 1 tablet (75 mg total) by mouth daily with breakfast. 90 tablet 4  . docusate sodium (COLACE) 100 MG capsule Take 100 mg by mouth daily.    Marland Kitchen lisinopril-hydrochlorothiazide (PRINZIDE,ZESTORETIC) 20-12.5 MG tablet Take 2 tablets by mouth daily. 180 tablet 3  . nitroGLYCERIN (NITROSTAT) 0.4 MG SL tablet Place 1 tablet (0.4 mg total) under the tongue every 5 (five) minutes as needed. 25 tablet  3  . pantoprazole (PROTONIX) 40 MG tablet Take 1 tablet (40 mg total) by mouth daily. 90 tablet 3  . polyethylene glycol (MIRALAX / GLYCOLAX) packet Take 17 g by mouth daily.    . valACYclovir (VALTREX) 500 MG tablet Take 500 mg by mouth daily.  0  . rosuvastatin (CRESTOR) 5 MG tablet Take 1 tablet (5 mg total) by mouth once a week. 30 tablet 1   No facility-administered medications prior to visit.      Allergies:   Ezetimibe and Statins   Social History   Social History  . Marital status: Divorced    Spouse name: N/A  . Number of children: N/A  . Years of education: N/A   Social History Main Topics  . Smoking status: Never Smoker  . Smokeless tobacco: Never Used  . Alcohol use 1.2 oz/week    2 Shots of liquor per week  . Drug use: No  . Sexual activity: Not Currently   Other Topics Concern  . None   Social History Narrative  . None     Family History:  The patient's family history includes CAD in her father.   Review of Systems:   Please see the history of present illness.     General:  No chills, fever, night sweats or weight changes.  Cardiovascular:  No chest pain, dyspnea on exertion, edema, orthopnea, palpitations, paroxysmal nocturnal dyspnea. Positive for chest pain.  Dermatological: No rash, lesions/masses Respiratory: No cough, dyspnea Urologic: No hematuria, dysuria Abdominal:   No nausea, vomiting, diarrhea, bright red blood per rectum, melena, or hematemesis Neurologic:  No visual changes, wkns, changes in mental status. All other systems reviewed and are otherwise negative except as noted above.   Physical Exam:    VS:  BP 123/83   Pulse (!) 55   Ht 5\' 5"  (1.651 m)   Wt 171 lb (77.6 kg)   BMI 28.46 kg/m    General: Well developed, well nourished [place] American female appearing in no acute distress. Head: Normocephalic, atraumatic, sclera non-icteric, no xanthomas, nares are without discharge.  Neck: No carotid bruits. JVD not elevated.    Lungs: Respirations regular and unlabored, without wheezes or rales.  Heart: Regular rate and rhythm. No S3 or S4.  No murmur, no rubs, or gallops appreciated. Abdomen: Soft, non-tender, non-distended with normoactive bowel sounds. No hepatomegaly. No rebound/guarding. No obvious abdominal masses. Msk:  Strength and tone appear normal for age. No joint deformities or effusions. Extremities: No clubbing or cyanosis. No edema.  Distal pedal pulses are 2+ bilaterally. Right radial cath site without ecchymosis or evidence of a hematoma.  Neuro: Alert and oriented X 3. Moves all extremities spontaneously. No focal deficits noted. Psych:  Responds to questions appropriately with a normal affect. Skin: No rashes or lesions noted  Wt Readings from Last 3 Encounters:  11/21/16 171 lb (77.6 kg)  11/08/16 173 lb (78.5 kg)  11/05/16 173 lb 9.6 oz (78.7 kg)    Studies/Labs Reviewed:   EKG:  EKG is ordered today.  The ekg ordered today demonstrates sinus bradycardia, HR 55. TWI along anterior leads now resolved when compared to prior tracings.   Recent Labs: 11/05/2016: TSH 0.84 11/09/2016: BUN 8; Creatinine, Ser 0.84; Hemoglobin 10.9; Platelets 211; Potassium 3.6; Sodium 136   Lipid Panel    Component Value Date/Time   CHOL 236 (H) 11/13/2015 1008   TRIG 92 11/13/2015 1008   HDL 45 (L) 11/13/2015 1008   CHOLHDL 5.2 (H) 11/13/2015 1008   VLDL 18 11/13/2015 1008   LDLCALC 173 (H) 11/13/2015 1008   LDLDIRECT 129 (H) 12/30/2008 1930    Additional studies/ records that were reviewed today include:   Cardiac Catheterization: 11/08/2016 Conclusions: 1. Significant single-vessel coronary artery disease with 95% stenosis of the mid LAD immediately distal to D1. D1 also has approximately 30% ostial stenosis, increased to 50% post PCI to the LAD secondary to plaque shift. 2. Mild to moderate, nonobstructive CAD involving the distal LAD and large OM1 branch. 3. Mildly elevated left ventricular filling  pressure. 4. Successful PCI to the mid LAD with placement of a Synergy 3.0 x 16 mm drug-eluting stent, postdilated with a 3.5 mm noncompliant balloon at high pressure.  Recommendations: 1. Admit for overnight extended recovery. 2. TR band protocol. 3. Dual antiplatelet therapy with aspirin and clopidogrel for at least 6 months, ideally longer. 4. Aggressive secondary prevention including retrial of statin therapy versus initiation of PCSK9 inhibitor. Patient has agreed to a trial of once weekly low-dose rosuvastatin.  Assessment:    1. Coronary artery disease of native artery of native heart with stable angina pectoris (HCC)   2. Essential hypertension   3. Hypercholesterolemia      Plan:   In order of problems listed above:  1. CAD with stable angina - stress test in 09/2016 showed a large area of moderate severity consistent with ischemia in the LAD and RCA territory (SDS = 18, TID 1.23), overall being a high-risk stress test. Cardiac cath was recommended and performed on 11/08/2016 which showed 95% stenosis along the mid-LAD and nonobstructive disease in the D1, distal LAD, and OM1. A 3.0 x 16 mm Synergy DES was placed to the mid-LAD. - reports chest discomfort has significantly improved since cath. Reports good compliance with DAPT. Repeat EKG today shows TWI along anterior leads has now resolved when compared to prior tracings.  - continue ASA, Plavix, statin and BB.  2. Essential HTN - BP well-controlled at 123/83 during today's visit.  - continue Coreg 12.5mg  BID and Lisinopril-HCTZ 20-12.5mg  BID.   3. HLD - Lipid Panel in 11/2015 showed total cholesterol 236, HDL 45, and LDL 173. Goal LDL is < 70 with known CAD.  - She has only been taking 2.5 mg of Crestor weekly and is hesitant to increase this to 5 mg dosing. She is agreeable to taking Crestor 2.5mg  every other day. The importance of reducing her cholesterol in the setting of CAD was reviewed with the patient. Unable to  tolerate Zetia in the past. Will recheck FLP/LFT's in 3 months. If not at goal, consider initiation of PCSK9 inhibitor. This was discussed briefly with the patient today but she is not keen on this idea.   Medication Adjustments/Labs and Tests Ordered: Current medicines are reviewed at length with the patient today.  Concerns regarding medicines are outlined  above.  Medication changes, Labs and Tests ordered today are listed in the Patient Instructions below. Patient Instructions  Medication Instructions:  TAKE- Crestor 2.5 mg every other day  Labwork: Fasting Lipids and CMP in 3 Months  Testing/Procedures: None Ordered  Follow-Up: Your physician recommends that you schedule a follow-up appointment in: 3 Months with Dr Royann Shivers  Any Other Special Instructions Will Be Listed Below (If Applicable).  If you need a refill on your cardiac medications before your next appointment, please call your pharmacy.   Lorri Frederick, [place]  11/21/2016 5:05 PM    [facility] [address] [place], [address] [place], [place]  [address] Phone: [phone]; Fax: [phone]  [address], [address] [place], [place] [address] Phone: [phone]

## 2016-11-21 ENCOUNTER — Ambulatory Visit (INDEPENDENT_AMBULATORY_CARE_PROVIDER_SITE_OTHER): Payer: BLUE CROSS/BLUE SHIELD | Admitting: Student

## 2016-11-21 ENCOUNTER — Encounter: Payer: Self-pay | Admitting: Student

## 2016-11-21 VITALS — BP 123/83 | HR 55 | Ht 65.0 in | Wt 171.0 lb

## 2016-11-21 DIAGNOSIS — I10 Essential (primary) hypertension: Secondary | ICD-10-CM

## 2016-11-21 DIAGNOSIS — E7800 Pure hypercholesterolemia, unspecified: Secondary | ICD-10-CM

## 2016-11-21 DIAGNOSIS — I25118 Atherosclerotic heart disease of native coronary artery with other forms of angina pectoris: Secondary | ICD-10-CM

## 2016-11-21 MED ORDER — ROSUVASTATIN CALCIUM 5 MG PO TABS: 2.5000 mg | ORAL_TABLET | ORAL | 3 refills | Status: DC

## 2016-11-21 NOTE — Progress Notes (Signed)
Thank you Brittany! °

## 2016-11-21 NOTE — Patient Instructions (Signed)
Medication Instructions:  TAKE- Crestor 2.5 mg every other day  Labwork: Fasting Lipids and CMP in 3 Months  Testing/Procedures: None Ordered  Follow-Up: Your physician recommends that you schedule a follow-up appointment in: 3 Months with Dr Royann Shiversroitoru   Any Other Special Instructions Will Be Listed Below (If Applicable).   If you need a refill on your cardiac medications before your next appointment, please call your pharmacy.

## 2016-11-25 ENCOUNTER — Telehealth: Payer: Self-pay | Admitting: Cardiovascular Disease

## 2016-11-25 NOTE — Telephone Encounter (Signed)
SPOKE TO  PATIENT, INFORMED PATIENT TO CONTACT PRIMARY IN REGARDS  A SUPPOSITORY MAY TRY OVER THE COUNTER MEDICATION   PATIENT VERBALIZED UNDERSTANDING.

## 2016-11-25 NOTE — Telephone Encounter (Signed)
Mrs. Stacy Horton is calling because she is on Plavix and she having problems . Her hemorids are bleeding and she is asklng to get a prescription for Suppositories to be sent to [facility] on [address] . Any Questions Please call ..Marland Kitchen

## 2016-11-25 NOTE — Telephone Encounter (Signed)
Left message to call back  recommendation  Contact primary-   Bleeding  may purchase over the counter  Medication for hemorrhoids (  ie tucks supp.)

## 2016-11-26 ENCOUNTER — Ambulatory Visit: Payer: BLUE CROSS/BLUE SHIELD | Admitting: Cardiovascular Disease

## 2016-12-17 ENCOUNTER — Telehealth (HOSPITAL_COMMUNITY): Payer: Self-pay | Admitting: Family Medicine

## 2016-12-17 NOTE — Telephone Encounter (Signed)
Verified BCBS insurance benefits through Passport. No Copay, Coinsurance 30%, Deductible $400.00 pt has met all Deductible Out of Pocket $800.00 , pt has met all of OOP....  Reference [phone]... KJ  lft msg for pt to c/b to see if interested in CRP.Marland KitchenMarland Kitchen

## 2017-02-12 ENCOUNTER — Telehealth: Payer: Self-pay | Admitting: Cardiovascular Disease

## 2017-02-12 NOTE — Telephone Encounter (Signed)
New Message     Pt has stent needs to know if they need to premed  1. What dental office are you calling from? Dr Langston MaskerMorris   2. What is your office phone and fax number? Fax [phone]  ofc [phone]  3. What type of procedure is the patient having performed? Routine cleaning and exam   4. What date is procedure scheduled? 02/13/17  5. What is your question (ex. Antibiotics prior to procedure, holding medication-we need to know how long dentist wants pt to hold med)?  Please advise

## 2017-02-12 NOTE — Telephone Encounter (Signed)
She does not need antibiotics.  She CANNOT stop aspirin and plavix. MCr

## 2017-02-12 NOTE — Telephone Encounter (Signed)
Spoke to dentist office . Aware will forward to dr croitoru

## 2017-02-12 NOTE — Telephone Encounter (Signed)
INFORMED DENTAL OFFICE INFORMATION ROUTED TO OFFICE

## 2017-02-24 IMAGING — CR DG CHEST 2V
2 series · 2 of 2 positions shown · non-contrast
Comparison: 08/19/2007

CLINICAL DATA: Chest pain, shortness of Breath

EXAM:
CHEST  2 VIEW

[w chest pa]
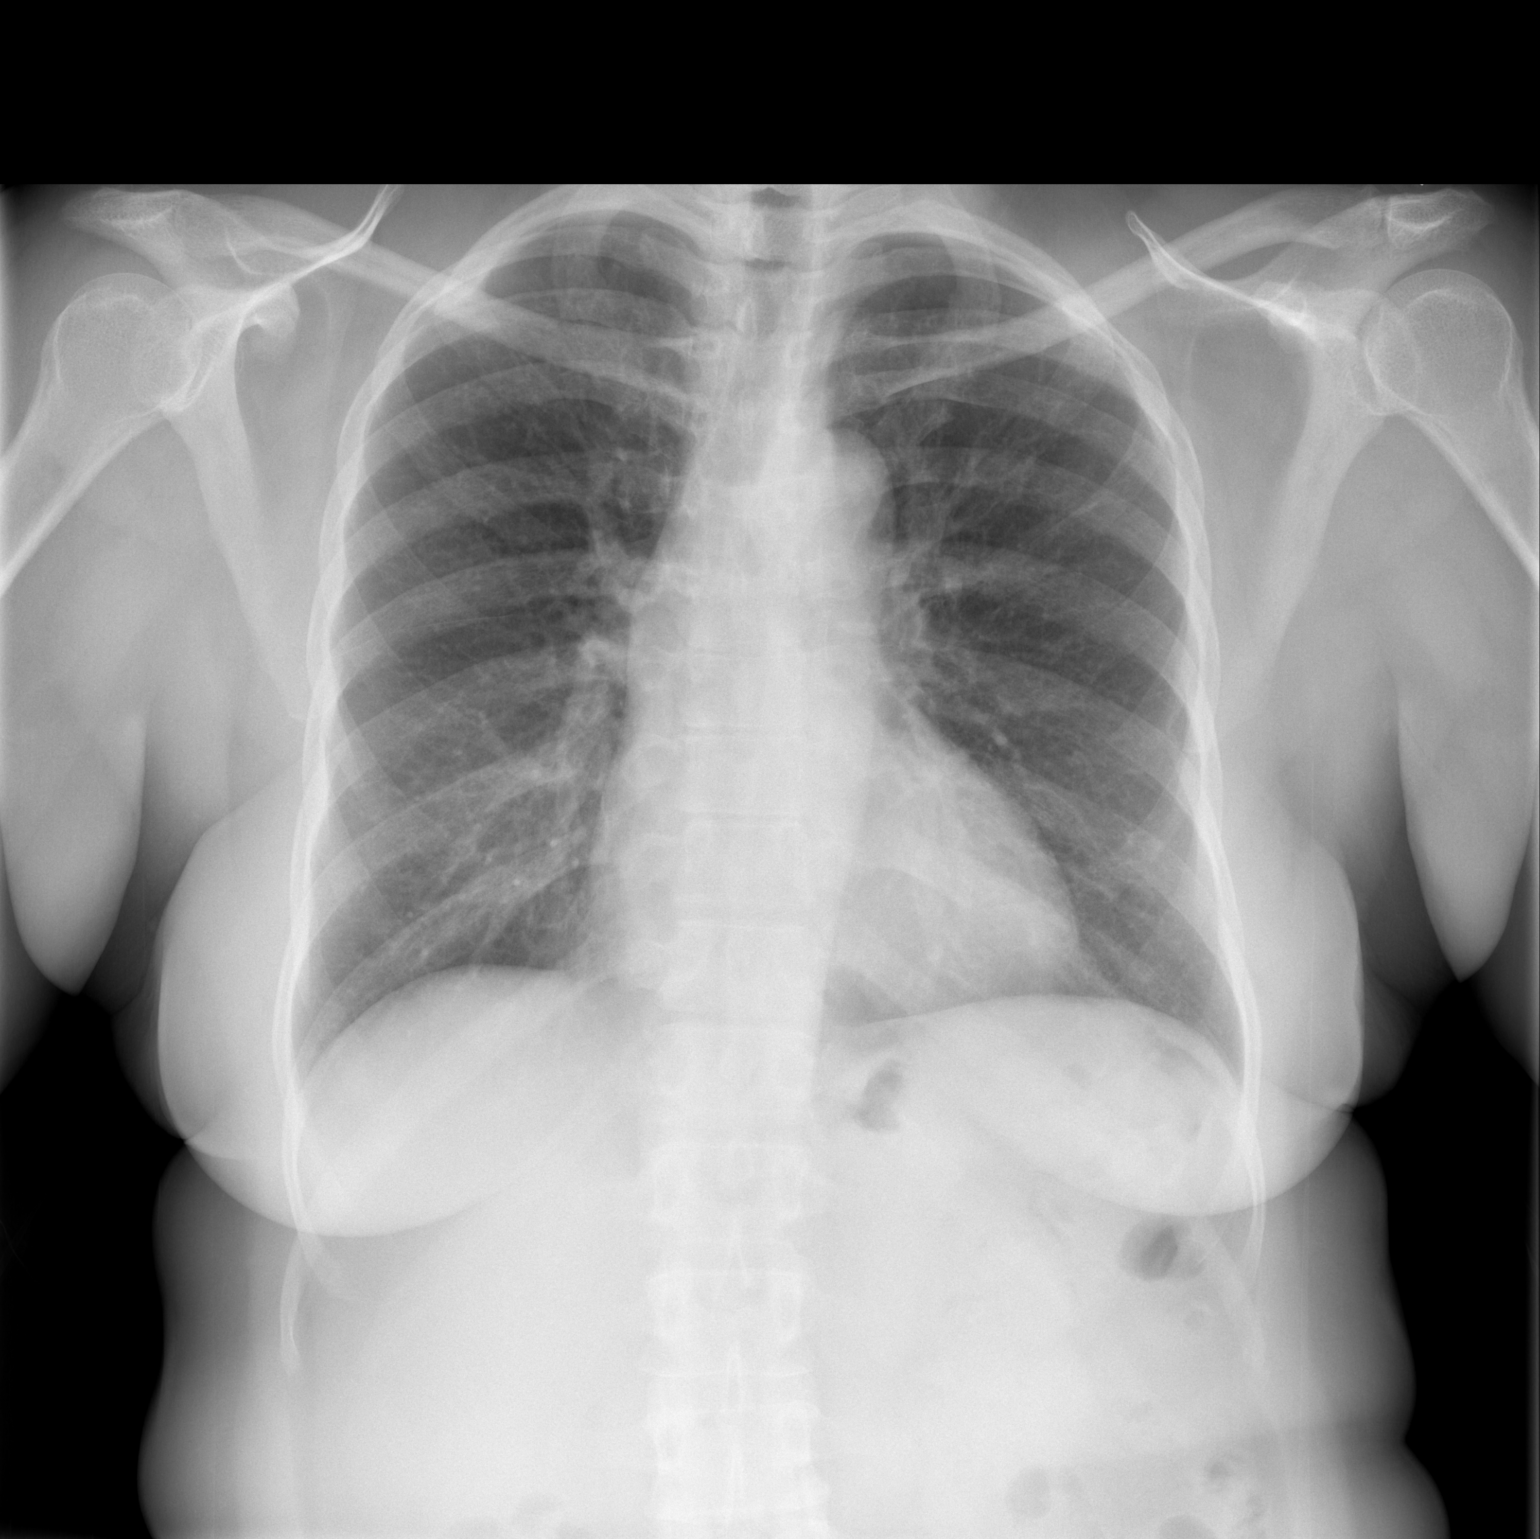

[w chest lat]
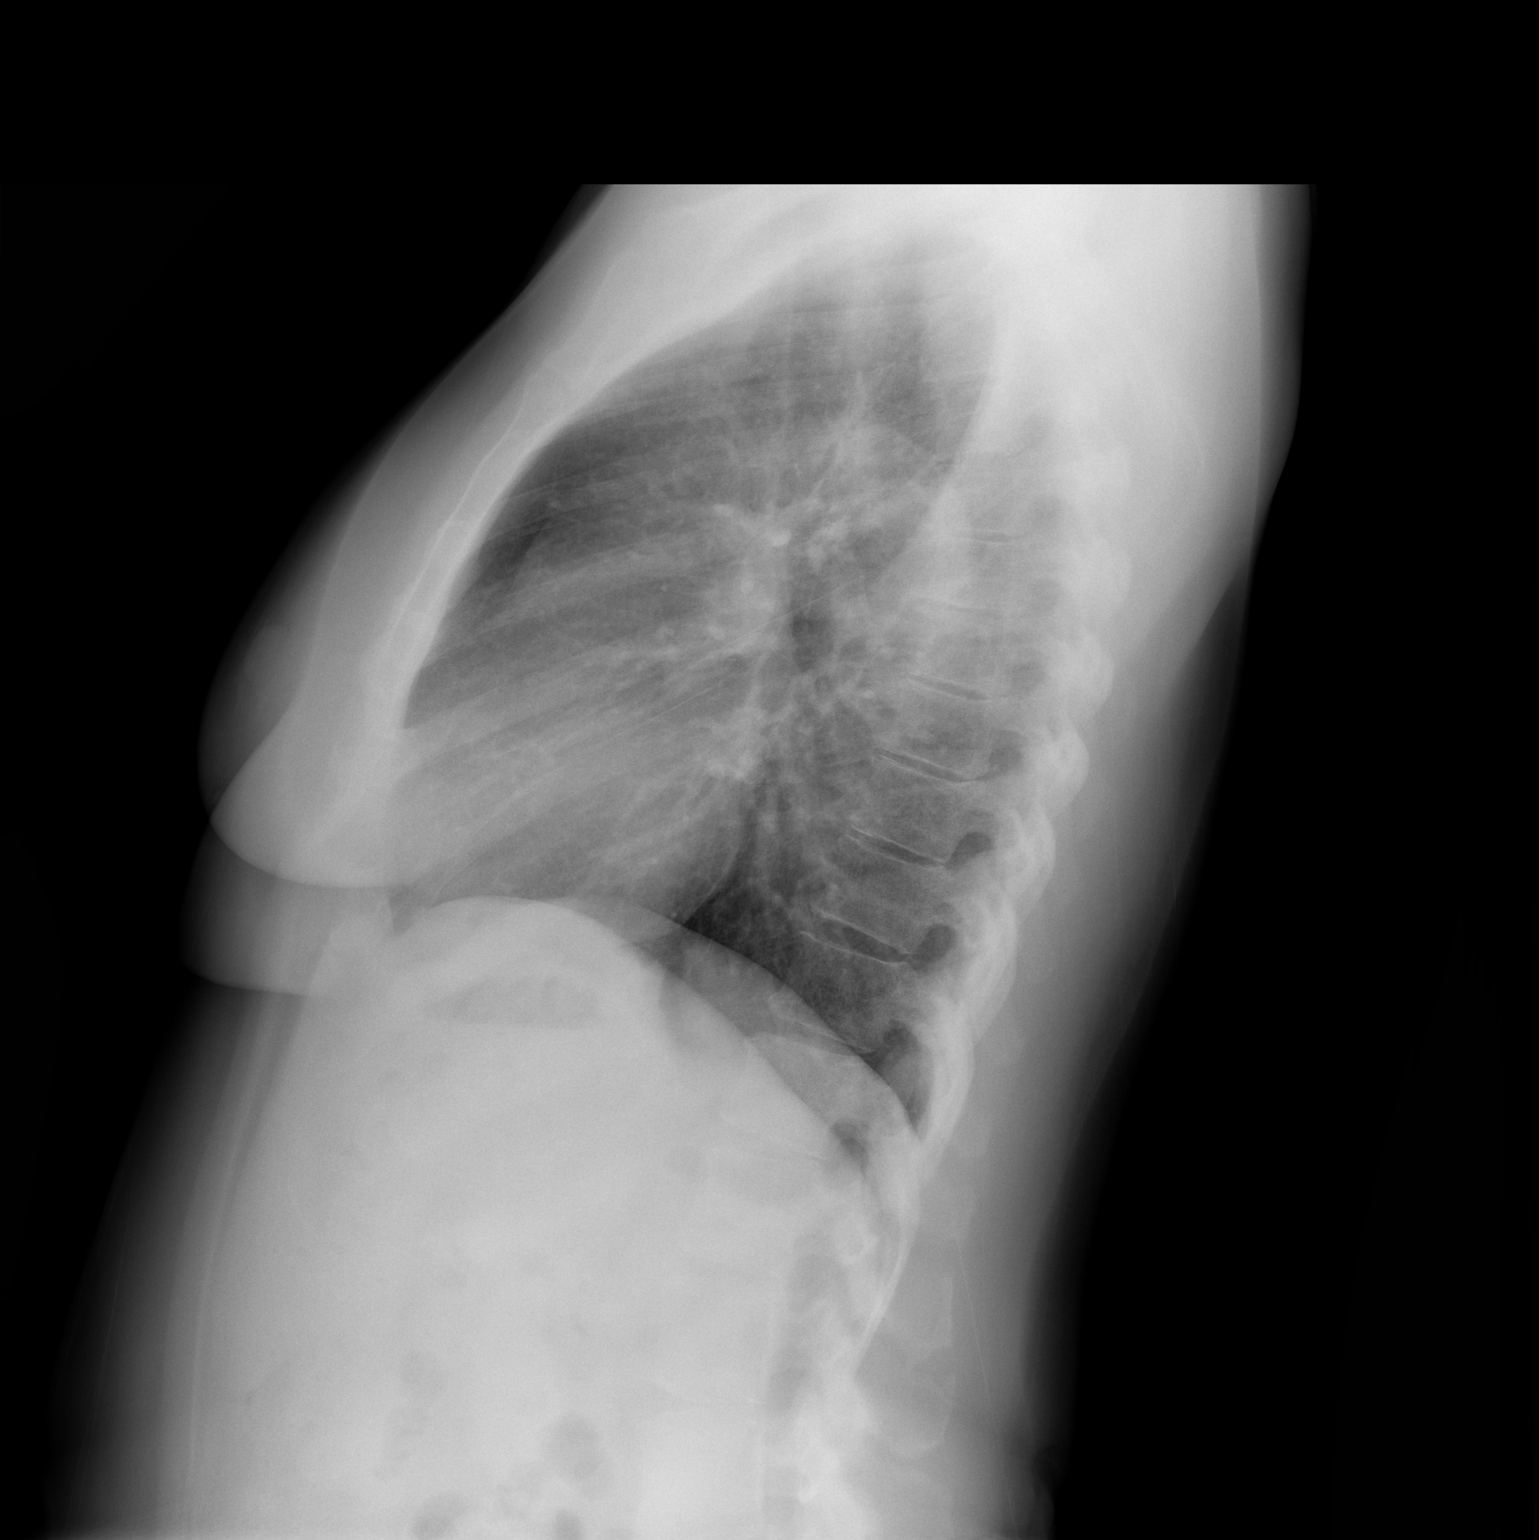

[2 of 2 positions shown; findings below may reference images not displayed]

FINDINGS: Heart and mediastinal contours are within normal limits. No focal
opacities or effusions. No acute bony abnormality.
IMPRESSION: No active cardiopulmonary disease.

## 2017-02-27 ENCOUNTER — Ambulatory Visit (INDEPENDENT_AMBULATORY_CARE_PROVIDER_SITE_OTHER): Payer: BLUE CROSS/BLUE SHIELD | Admitting: Cardiovascular Disease

## 2017-02-27 ENCOUNTER — Encounter: Payer: Self-pay | Admitting: Cardiovascular Disease

## 2017-02-27 VITALS — BP 138/82 | HR 75 | Ht 65.0 in | Wt 171.6 lb

## 2017-02-27 DIAGNOSIS — I2510 Atherosclerotic heart disease of native coronary artery without angina pectoris: Secondary | ICD-10-CM | POA: Diagnosis not present

## 2017-02-27 DIAGNOSIS — E7800 Pure hypercholesterolemia, unspecified: Secondary | ICD-10-CM

## 2017-02-27 DIAGNOSIS — I10 Essential (primary) hypertension: Secondary | ICD-10-CM | POA: Diagnosis not present

## 2017-02-27 DIAGNOSIS — E785 Hyperlipidemia, unspecified: Secondary | ICD-10-CM | POA: Diagnosis not present

## 2017-02-27 DIAGNOSIS — Z79899 Other long term (current) drug therapy: Secondary | ICD-10-CM

## 2017-02-27 DIAGNOSIS — E119 Type 2 diabetes mellitus without complications: Secondary | ICD-10-CM

## 2017-02-27 LAB — LIPID PANEL
CHOLESTEROL TOTAL: 201 mg/dL — AB (ref 100–199)
Chol/HDL Ratio: 3.7 ratio (ref 0.0–4.4)
HDL: 55 mg/dL (ref >39)
LDL Calculated: 129 mg/dL — ABNORMAL HIGH (ref 0–99)
Triglycerides: 84 mg/dL (ref 0–149)
VLDL CHOLESTEROL CAL: 17 mg/dL (ref 5–40)

## 2017-02-27 LAB — COMPREHENSIVE METABOLIC PANEL
ALK PHOS: 80 IU/L (ref 39–117)
ALT: 15 IU/L (ref 0–32)
AST: 19 IU/L (ref 0–40)
Albumin/Globulin Ratio: 1.4 (ref 1.2–2.2)
Albumin: 4.2 g/dL (ref 3.6–4.8)
BILIRUBIN TOTAL: 0.3 mg/dL (ref 0.0–1.2)
BUN/Creatinine Ratio: 7 — ABNORMAL LOW (ref 12–28)
BUN: 6 mg/dL — AB (ref 8–27)
CO2: 26 mmol/L (ref 18–29)
Calcium: 10 mg/dL (ref 8.7–10.3)
Chloride: 99 mmol/L (ref 96–106)
Creatinine, Ser: 0.9 mg/dL (ref 0.57–1.00)
GFR calc Af Amer: 79 mL/min/{1.73_m2} (ref >59)
GFR calc non Af Amer: 69 mL/min/{1.73_m2} (ref >59)
Globulin, Total: 2.9 g/dL (ref 1.5–4.5)
Glucose: 111 mg/dL — ABNORMAL HIGH (ref 65–99)
POTASSIUM: 4.5 mmol/L (ref 3.5–5.2)
Sodium: 138 mmol/L (ref 134–144)
Total Protein: 7.1 g/dL (ref 6.0–8.5)

## 2017-02-27 NOTE — Patient Instructions (Signed)
Dr Royann Shiversroitoru recommends that you continue on your current medications as directed. Please refer to the Current Medication list given to you today.  Your physician recommends that you return for lab work TODAY or at your earliest convenience - FASTING.  Dr Royann Shiversroitoru recommends that you schedule a follow-up appointment in FEBRUARY 2019. You will receive a reminder letter in the mail two months in advance. If you don't receive a letter, please call our office to schedule the follow-up appointment.  If you need a refill on your cardiac medications before your next appointment, please call your pharmacy.

## 2017-02-27 NOTE — Progress Notes (Signed)
Patient ID: Stacy Horton, female   DOB: 12/10/1955, 61 y.o.   MRN: [id number]    Cardiology Office Note    Date:  02/28/2017   ID:  Stacy Horton, DOB 11-15-1955, MRN [id number]  PCP:  Araceli Bouche, DO  Cardiologist:   Thurmon Fair, MD   Chief Complaint  Patient presents with  . Edema    both feet     History of Present Illness:  Stacy Horton is a 61 y.o. female with systemic hypertension, type 2 diabetes mellitus (diet controlled) and hyperlipidemia without known structural heart disease who presents for follow-up, about 3 months after placement of a drug-eluting stent in the mid LAD artery (3.016 mm Synergy). She has a history of statin intolerance but has been taking once weekly rosuvastatin.  Cardiac catheterization was precipitated by an abnormal nuclear stress test showing anterior wall ischemia, in turn performed due to complaints of exertional dyspnea. She has had marked improvement in dyspnea, denies angina at rest or with activity.  She has well-controlled diabetes mellitus and her blood pressure is usually well treated.  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   Past Medical History:  Diagnosis Date  . Coronary artery disease    a. 10/2016: LHC with 95% mLAD s/p DES, mod non obst dz in dLAD and OM1  . GERD (gastroesophageal reflux disease)   . Hemorrhoids   . Hyperlipidemia    a. intolerant to multiple statins and Zetia  . Hypertension   . IBS (irritable bowel syndrome)   . Lower extremity edema   . Obesity   . Refusal of blood transfusions as patient is Jehovah's Witness   . Type II diabetes mellitus (HCC)     Past Surgical History:  Procedure Laterality Date  . ABDOMINAL HYSTERECTOMY  1990s  . BREAST BIOPSY Left ~ 2006/2007   "it was nothing"  . CARPAL TUNNEL RELEASE Bilateral 2011-2012  . CORONARY ANGIOPLASTY WITH STENT PLACEMENT  11/08/2016  . CORONARY STENT INTERVENTION N/A 11/08/2016   Procedure:  Coronary Stent Intervention;  Surgeon: Yvonne Kendall, MD;  Location: [facility] INVASIVE CV LAB;  Service: Cardiovascular;  Laterality: N/A;  . LEFT HEART CATH AND CORONARY ANGIOGRAPHY N/A 11/08/2016   Procedure: Left Heart Cath and Coronary Angiography;  Surgeon: Yvonne Kendall, MD;  Location: [facility] INVASIVE CV LAB;  Service: Cardiovascular;  Laterality: N/A;  . NM MYOVIEW LTD  03/15/2008   no ischemia  . US ECHOCARDIOGRAPHY  09/28/2010   normal    Outpatient Medications Prior to Visit  Medication Sig Dispense Refill  . aspirin 81 MG chewable tablet Chew 1 tablet (81 mg total) by mouth daily.    . Biotin 5000 MCG TABS Take 1 tablet by mouth daily.    . carvedilol (COREG) 12.5 MG tablet Take 1 tablet (12.5 mg total) by mouth 2 (two) times daily. 180 tablet 3  . chlorhexidine (PERIDEX) 0.12 % solution Use as directed 15 mLs in the mouth or throat 2 (two) times daily.    . Cholecalciferol (VITAMIN D3) 2000 units CHEW Chew 1 each by mouth daily.    . clopidogrel (PLAVIX) 75 MG tablet Take 1 tablet (75 mg total) by mouth daily with breakfast. 90 tablet 4  . docusate sodium (COLACE) 100 MG capsule Take 100 mg by mouth daily.    Marland Kitchen lisinopril-hydrochlorothiazide (PRINZIDE,ZESTORETIC) 20-12.5 MG tablet Take 2 tablets by mouth daily. 180 tablet 3  . nitroGLYCERIN (NITROSTAT) 0.4 MG SL tablet Place 1 tablet (0.4  mg total) under the tongue every 5 (five) minutes as needed. 25 tablet 3  . pantoprazole (PROTONIX) 40 MG tablet Take 1 tablet (40 mg total) by mouth daily. 90 tablet 3  . polyethylene glycol (MIRALAX / GLYCOLAX) packet Take 17 g by mouth daily.    . rosuvastatin (CRESTOR) 5 MG tablet Take 0.5 tablets (2.5 mg total) by mouth every other day. 30 tablet 3  . valACYclovir (VALTREX) 500 MG tablet Take 500 mg by mouth daily.  0   No facility-administered medications prior to visit.      Allergies:   Ezetimibe and Statins   Social History   Social History  . Marital status: Divorced    Spouse name:  N/A  . Number of children: N/A  . Years of education: N/A   Social History Main Topics  . Smoking status: Never Smoker  . Smokeless tobacco: Never Used  . Alcohol use 1.2 oz/week    2 Shots of liquor per week  . Drug use: No  . Sexual activity: Not Currently   Other Topics Concern  . None   Social History Narrative  . None      ROS:   Please see the history of present illness.    ROS All other systems reviewed and are negative.   PHYSICAL EXAM:   VS:  BP 138/82   Pulse 75   Ht 5\' 5"  (1.651 m)   Wt 171 lb 9.6 oz (77.8 kg)   SpO2 98%   BMI 28.56 kg/m    GEN: Well nourished, well developed, in no acute distress  HEENT: normal  Neck: no JVD, carotid bruits, or masses Cardiac: RRR; no murmurs, rubs, or gallops,no edema  Respiratory:  clear to auscultation bilaterally, normal work of breathing GI: soft, nontender, nondistended, + BS MS: no deformity or atrophy  Skin: warm and dry, no rash Neuro:  Alert and Oriented x 3, Strength and sensation are intact Psych: euthymic mood, full affect  Wt Readings from Last 3 Encounters:  02/27/17 171 lb 9.6 oz (77.8 kg)  11/21/16 171 lb (77.6 kg)  11/08/16 173 lb (78.5 kg)      Studies/Labs Reviewed:   EKG:  EKG is ordered today.  The ekg ordered today demonstrates normal sinus rhythm, QS pattern in V1-V2, mild repolarization abnormalities in leads 3, F and V1-V3, similar to December 20 tracing, but new from previous tracings  Recent Labs: 11/05/2016: TSH 0.84 11/09/2016: Hemoglobin 10.9; Platelets 211 02/27/2017: ALT 15; BUN 6; Creatinine, Ser 0.90; Potassium 4.5; Sodium 138   Lipid Panel    Component Value Date/Time   CHOL 201 (H) 02/27/2017 1115   TRIG 84 02/27/2017 1115   HDL 55 02/27/2017 1115   CHOLHDL 3.7 02/27/2017 1115   CHOLHDL 5.2 (H) 11/13/2015 1008   VLDL 18 11/13/2015 1008   LDLCALC 129 (H) 02/27/2017 1115   LDLDIRECT 129 (H) 12/30/2008 1930    ASSESSMENT:    1. Dyslipidemia   2. Coronary artery  disease involving native heart without angina pectoris, unspecified vessel or lesion type   3. Essential hypertension   4. Hypercholesterolemia   5. Diabetes mellitus type 2, diet-controlled (HCC)   6. Medication management      PLAN:  In order of problems listed above:  1. CAD: Stacy Horton has recent onset exertional dyspneaAs an anginal equivalent, improved following her PCI. Reinforced the importance of dual antiplatelet therapy for a minimum of 6 months, preferably a year. Also reviewed the importance of long-term coronary  risk factor management, encouraged sustained physical exercise even after she graduates from cardiac rehabilitation, weight loss  2. HTN: Her blood pressure control is fair  3. HLP: Currently on a low dose of every other day rosuvastatin, due to history of statin-induced myalgia. LDL is not even close to target. Will increase to once daily. If this is unsuccessful or poorly tolerated needs to start taking PCS K9 inhibitors.  4. DM: Reports good glycemic control without medications. 5. Jehovah's witness    Medication Adjustments/Labs and Tests Ordered: Current medicines are reviewed at length with the patient today.  Concerns regarding medicines are outlined above.  Medication changes, Labs and Tests ordered today are listed in the Patient Instructions below. Patient Instructions  Dr Royann Shivers recommends that you continue on your current medications as directed. Please refer to the Current Medication list given to you today.  Your physician recommends that you return for lab work TODAY or at your earliest convenience - FASTING.  Dr Royann Shivers recommends that you schedule a follow-up appointment in FEBRUARY 2019. You will receive a reminder letter in the mail two months in advance. If you don't receive a letter, please call our office to schedule the follow-up appointment.  If you need a refill on your cardiac medications before your next appointment, please call  your pharmacy.      Signed, Thurmon Fair, MD  02/28/2017 6:45 PM    [facility] [address] [place], [place], [place]  [address] Phone: [phone]; Fax: [phone]

## 2017-02-28 ENCOUNTER — Telehealth: Payer: Self-pay | Admitting: Cardiovascular Disease

## 2017-02-28 DIAGNOSIS — E7800 Pure hypercholesterolemia, unspecified: Secondary | ICD-10-CM

## 2017-02-28 LAB — HEMOGLOBIN A1C
ESTIMATED AVERAGE GLUCOSE: 148 mg/dL
HEMOGLOBIN A1C: 6.8 % — AB (ref 4.8–5.6)

## 2017-02-28 NOTE — Telephone Encounter (Signed)
New message     [facility]

## 2017-03-03 MED ORDER — ROSUVASTATIN CALCIUM 5 MG PO TABS: 5.0000 mg | ORAL_TABLET | Freq: Every day | ORAL | 11 refills | Status: DC

## 2017-03-03 NOTE — Telephone Encounter (Signed)
Notes recorded by Thurmon Fairroitoru, Mihai, MD on 02/27/2017 at 5:46 PM EDT Routine labs are all normal. Total cholesterol and LDL cholesterol have improved or still well above our target range. Please increase the Crestor to 5 mg, half tablet every day. I suspect even this will not get us to target of LDL under 70. If after a month she is tolerating the medication without side effects, please increase to a full 5 mg tablet daily, then recheck labs in 2-3 months. (Lipid profile only, no need for others). MCr  Spoke with pt, aware of lab results and dr croitoru's recommendations. New script sent to the pharmacy. Lab orders mailed to the pt

## 2017-03-03 NOTE — Telephone Encounter (Signed)
Follow up    Pt is calling back for Chelley. 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

## 2017-03-06 ENCOUNTER — Inpatient Hospital Stay (HOSPITAL_COMMUNITY): Admission: RE | Admit: 2017-03-06 | Payer: Self-pay | Source: Ambulatory Visit

## 2017-03-06 ENCOUNTER — Telehealth (HOSPITAL_COMMUNITY): Payer: Self-pay

## 2017-03-06 NOTE — Telephone Encounter (Deleted)
Cardiac Rehab - Pharmacy Resident Documentation   Patient unable to be reached after three call attempts. Please complete allergy verification and medication review during patient's cardiac rehab appointment.     

## 2017-03-07 ENCOUNTER — Telehealth: Payer: Self-pay | Admitting: Cardiovascular Disease

## 2017-03-07 NOTE — Telephone Encounter (Signed)
-----   Message from Thurmon FairMihai Croitoru, MD sent at 02/28/2017  8:00 AM EDT ----- Average blood glucose level remains in target range, but just a little higher than over the last 2-3 years at 6.8%

## 2017-03-07 NOTE — Telephone Encounter (Signed)
Cardiac Rehab Medication Review by a Pharmacist  Does the patient feel that his/her medications are working for him/her?  yes  Has the patient been experiencing any side effects to the medications prescribed?  yes  Does the patient measure his/her own blood pressure or blood glucose at home?  no   Does the patient have any problems obtaining medications due to transportation or finances?   no  Understanding of regimen: good Understanding of indications: good Potential of compliance: fair   Pharmacist comments:  Reviewed Ms. Reasor medications via phone. She reports having some muscle pain with statins in the past, so she is currently taking 1/2 tab (2.5mg ) of Crestor for 3 weeks and then 1 tab (5mg ) daily. She does not measure BP at home and does not have a system to remember taking her medications and reports missing second dose of carvedilol occasionally. Encouraged pt to obtain an AM/PM pill box, setting an alarm to help her remember. Pt verbalized understanding.   Mackie Paienee Keian Odriscoll, PharmD PGY1 Pharmacy Resident Pager: [phone] 03/07/2017 10:44 AM

## 2017-03-07 NOTE — Telephone Encounter (Signed)
Mrs. Stacy Horton is returning a call . Please call. Thanks

## 2017-03-07 NOTE — Telephone Encounter (Signed)
Spoke with pt, aware of lab results and instructions regarding rosuvastatin. She will call with symptoms and to get lab work scheduled.

## 2017-03-10 ENCOUNTER — Encounter (HOSPITAL_COMMUNITY): Payer: BLUE CROSS/BLUE SHIELD

## 2017-03-12 ENCOUNTER — Encounter (HOSPITAL_COMMUNITY): Payer: BLUE CROSS/BLUE SHIELD

## 2017-03-13 ENCOUNTER — Inpatient Hospital Stay (HOSPITAL_COMMUNITY): Admission: RE | Admit: 2017-03-13 | Payer: Self-pay | Source: Ambulatory Visit

## 2017-03-14 ENCOUNTER — Encounter (HOSPITAL_COMMUNITY): Payer: BLUE CROSS/BLUE SHIELD

## 2017-03-17 ENCOUNTER — Encounter (HOSPITAL_COMMUNITY): Payer: BLUE CROSS/BLUE SHIELD

## 2017-03-19 ENCOUNTER — Encounter (HOSPITAL_COMMUNITY): Payer: BLUE CROSS/BLUE SHIELD

## 2017-03-20 ENCOUNTER — Encounter (HOSPITAL_COMMUNITY)
Admission: RE | Admit: 2017-03-20 | Discharge: 2017-03-20 | Disposition: A | Discharge status: Home / Self Care | Payer: BLUE CROSS/BLUE SHIELD | Source: Ambulatory Visit | Attending: Cardiovascular Disease | Admitting: Cardiovascular Disease

## 2017-03-20 ENCOUNTER — Encounter (HOSPITAL_COMMUNITY): Payer: Self-pay

## 2017-03-20 VITALS — BP 122/74 | HR 65 | Ht 65.0 in | Wt 170.2 lb

## 2017-03-20 DIAGNOSIS — Z955 Presence of coronary angioplasty implant and graft: Secondary | ICD-10-CM | POA: Insufficient documentation

## 2017-03-20 NOTE — Progress Notes (Signed)
Cardiac Individual Treatment Plan  Patient Details  Name: Stacy Horton MRN: [id number] Date of Birth: Jul 01, 1956 Referring Provider:     CARDIAC REHAB PHASE II ORIENTATION from 03/20/2017 in [facility] CARDIAC [facility]  Referring Provider  Croitoru, Rachelle Hora MD      Initial Encounter Date:    CARDIAC REHAB PHASE II ORIENTATION from 03/20/2017 in [facility] CARDIAC REHAB  Date  03/20/17  Referring Provider  Croitoru, Mihai MD      Visit Diagnosis: 11/08/16 Status post coronary artery stent placement to LAD  Patient's Home Medications on Admission:  Current Outpatient Prescriptions:  .  aspirin 81 MG chewable tablet, Chew 1 tablet (81 mg total) by mouth daily., Disp: , Rfl:  .  Biotin 5000 MCG TABS, Take 1 tablet by mouth daily., Disp: , Rfl:  .  carvedilol (COREG) 12.5 MG tablet, Take 1 tablet (12.5 mg total) by mouth 2 (two) times daily., Disp: 180 tablet, Rfl: 3 .  chlorhexidine (PERIDEX) 0.12 % solution, Use as directed 15 mLs in the mouth or throat 2 (two) times daily., Disp: , Rfl:  .  Cholecalciferol (VITAMIN D3) 2000 units CHEW, Chew 1 each by mouth daily., Disp: , Rfl:  .  clopidogrel (PLAVIX) 75 MG tablet, Take 1 tablet (75 mg total) by mouth daily with breakfast., Disp: 90 tablet, Rfl: 4 .  docusate sodium (COLACE) 100 MG capsule, Take 100 mg by mouth daily., Disp: , Rfl:  .  linaclotide (LINZESS) 145 MCG CAPS capsule, Take 145 mcg by mouth daily before breakfast., Disp: , Rfl:  .  lisinopril-hydrochlorothiazide (PRINZIDE,ZESTORETIC) 20-12.5 MG tablet, Take 2 tablets by mouth daily., Disp: 180 tablet, Rfl: 3 .  nitroGLYCERIN (NITROSTAT) 0.4 MG SL tablet, Place 1 tablet (0.4 mg total) under the tongue every 5 (five) minutes as needed., Disp: 25 tablet, Rfl: 3 .  pantoprazole (PROTONIX) 40 MG tablet, Take 1 tablet (40 mg total) by mouth daily., Disp: 90 tablet, Rfl: 3 .  polyethylene glycol (MIRALAX / GLYCOLAX) packet, Take 17 g by mouth  daily., Disp: , Rfl:  .  rosuvastatin (CRESTOR) 5 MG tablet, Take 1 tablet (5 mg total) by mouth daily., Disp: 30 tablet, Rfl: 11 .  valACYclovir (VALTREX) 500 MG tablet, Take 500 mg by mouth daily., Disp: , Rfl: 0  Past Medical History: Past Medical History:  Diagnosis Date  . Coronary artery disease    a. 10/2016: LHC with 95% mLAD s/p DES, mod non obst dz in dLAD and OM1  . GERD (gastroesophageal reflux disease)   . Hemorrhoids   . Hyperlipidemia    a. intolerant to multiple statins and Zetia  . Hypertension   . IBS (irritable bowel syndrome)   . Lower extremity edema   . Obesity   . Refusal of blood transfusions as patient is [employer]hov[employer]'s [employer]   . Type II diabetes mellitus (HCC)     Tobacco Use: History  Smoking Status  . Never Smoker  Smokeless Tobacco  . Never Used    Labs: Recent Review Flowsheet Data    Labs for ITP Cardiac and Pulmonary Rehab Latest Ref Rng & Units 07/05/2014 04/06/2015 10/23/2015 11/13/2015 02/27/2017   Cholestrol 100 - 199 mg/dL - - - 409(W) 119(J)   LDLCALC 0 - 99 mg/dL - - - 478(G) 956(O)   LDLDIRECT mg/dL - - - - -   HDL >13 mg/dL - - - 08(M) 55   Trlycerides 0 - 149 mg/dL - - - 92 84  Hemoglobin A1c 4.8 - 5.6 % 6.1 6.1 6.4 - 6.8(H)      Capillary Blood Glucose: Lab Results  Component Value Date   GLUCAP 114 (H) 11/09/2016   GLUCAP 127 (H) 11/08/2016   GLUCAP 82 11/08/2016   GLUCAP 131 (H) 11/08/2016     Exercise Target Goals: Date: 03/20/17  Exercise Program Goal: Individual exercise prescription set with THRR, safety & activity barriers. Participant demonstrates ability to understand and report RPE using BORG scale, to self-measure pulse accurately, and to acknowledge the importance of the exercise prescription.  Exercise Prescription Goal: Starting with aerobic activity 30 plus minutes a day, 3 days per week for initial exercise prescription. Provide home exercise prescription and guidelines that participant acknowledges  understanding prior to discharge.  Activity Barriers & Risk Stratification:     Activity Barriers & Cardiac Risk Stratification - 03/20/17 0825      Activity Barriers & Cardiac Risk Stratification   Activity Barriers Back Problems;Shortness of Breath   Cardiac Risk Stratification High      6 Minute Walk:     6 Minute Walk    Row Name 03/20/17 1157         6 Minute Walk   Phase Initial     Distance 1627 feet     Walk Time 6 minutes     # of Rest Breaks 0     MPH 3.1     METS 3.8     RPE 11     VO2 Peak 13.4     Symptoms No     Resting HR 65 bpm     Resting BP 122/74     Max Ex. HR 100 bpm     Max Ex. BP 132/86     2 Minute Post BP 128/74        Oxygen Initial Assessment:   Oxygen Re-Evaluation:   Oxygen Discharge (Final Oxygen Re-Evaluation):   Initial Exercise Prescription:     Initial Exercise Prescription - 03/20/17 1200      Date of Initial Exercise RX and Referring Provider   Date 03/20/17   Referring Provider Croitoru, Mihai MD     Treadmill   MPH 2.8   Grade 1   Minutes 10   METs 3.53     Bike   Level 0.8   Minutes 10   METs 3     NuStep   Level 3   SPM 85   Minutes 10   METs 2.8     Prescription Details   Frequency (times per week) 3   Duration Progress to 45 minutes of aerobic exercise without signs/symptoms of physical distress     Intensity   THRR 40-80% of Max Heartrate 63-126   Ratings of Perceived Exertion 11-13   Perceived Dyspnea 0-4     Progression   Progression Continue to progress workloads to maintain intensity without signs/symptoms of physical distress.     Resistance Training   Training Prescription Yes   Weight 2   Reps 10-15      Perform Capillary Blood Glucose checks as needed.  Exercise Prescription Changes:   Exercise Comments:   Exercise Goals and Review:     Exercise Goals    Row Name 03/20/17 0825 03/20/17 0830           Exercise Goals   Increase Physical Activity Yes  -       Intervention Provide advice, education, support and counseling about physical activity/exercise needs.;Develop an individualized exercise prescription  for aerobic and resistive training based on initial evaluation findings, risk stratification, comorbidities and participant's personal goals.  -      Expected Outcomes Achievement of increased cardiorespiratory fitness and enhanced flexibility, muscular endurance and strength shown through measurements of functional capacity and personal statement of participant.  -      Increase Strength and Stamina Yes -  get back to dancing and be able to climb stairs without SOB. Learn activity/exercise limitations      Intervention Provide advice, education, support and counseling about physical activity/exercise needs.;Develop an individualized exercise prescription for aerobic and resistive training based on initial evaluation findings, risk stratification, comorbidities and participant's personal goals.  -      Expected Outcomes Achievement of increased cardiorespiratory fitness and enhanced flexibility, muscular endurance and strength shown through measurements of functional capacity and personal statement of participant.  -         Exercise Goals Re-Evaluation :    Discharge Exercise Prescription (Final Exercise Prescription Changes):   Nutrition:  Target Goals: Understanding of nutrition guidelines, daily intake of sodium 1500mg , cholesterol 200mg , calories 30% from fat and 7% or less from saturated fats, daily to have 5 or more servings of fruits and vegetables.  Biometrics:     Pre Biometrics - 03/20/17 1636      Pre Biometrics   Height 5\' 5"  (1.651 m)   Weight 170 lb 3.1 oz (77.2 kg)   Waist Circumference 36.5 inches   Hip Circumference 40.5 inches   Waist to Hip Ratio 0.9 %   BMI (Calculated) 28.4   Triceps Skinfold 32 mm   % Body Fat 40 %   Grip Strength 34 kg   Flexibility 12 in   Single Leg Stand 30 seconds       Nutrition  Therapy Plan and Nutrition Goals:     Nutrition Therapy & Goals - 03/20/17 1214      Nutrition Therapy   Diet Carb Modified, Therapeutic Lifestyle Changes     Personal Nutrition Goals   Nutrition Goal Wt loss of 1-2 lb/week to a wt loss goal wt of 6-10 lb at graduation from Cardiac Rehab. Pt's goal wt is 160 lb.    Personal Goal #2 Pt to identify and limit food sources of saturated fat, trans fat, and sodium.      Intervention Plan   Intervention Prescribe, educate and counsel regarding individualized specific dietary modifications aiming towards targeted core components such as weight, hypertension, lipid management, diabetes, heart failure and other comorbidities.;Nutrition handout(s) given to patient.  MEDFICTS results handout   Expected Outcomes Short Term Goal: Understand basic principles of dietary content, such as calories, fat, sodium, cholesterol and nutrients.;Long Term Goal: Adherence to prescribed nutrition plan.      Nutrition Discharge: Nutrition Scores:     Nutrition Assessments - 03/20/17 1216      MEDFICTS Scores   Pre Score 112      Nutrition Goals Re-Evaluation:   Nutrition Goals Re-Evaluation:   Nutrition Goals Discharge (Final Nutrition Goals Re-Evaluation):   Psychosocial: Target Goals: Acknowledge presence or absence of significant depression and/or stress, maximize coping skills, provide positive support system. Participant is able to verbalize types and ability to use techniques and skills needed for reducing stress and depression.  Initial Review & Psychosocial Screening:     Initial Psych Review & Screening - 03/20/17 1633      Initial Review   Current issues with None Identified     Family Dynamics   Good  Support System? Yes     Barriers   Psychosocial barriers to participate in program There are no identifiable barriers or psychosocial needs.     Screening Interventions   Interventions Encouraged to exercise      Quality of Life  Scores:     Quality of Life - 03/20/17 1229      Quality of Life Scores   Health/Function Pre 20.37 %   Socioeconomic Pre 20.94 %   Psych/Spiritual Pre 24 %   Family Pre 20.2 %   GLOBAL Pre 21.2 %      PHQ-9: Recent Review Flowsheet Data    Depression screen [facility] 2/9 10/23/2015 07/21/2015 05/12/2015 04/06/2015 07/08/2013   Decreased Interest 0 0 0 0 0   Down, Depressed, Hopeless 0 0 0 1 0   PHQ - 2 Score 0 0 0 1 0     Interpretation of Total Score  Total Score Depression Severity:  1-4 = Minimal depression, 5-9 = Mild depression, 10-14 = Moderate depression, 15-19 = Moderately severe depression, 20-27 = Severe depression   Psychosocial Evaluation and Intervention:   Psychosocial Re-Evaluation:   Psychosocial Discharge (Final Psychosocial Re-Evaluation):   Vocational Rehabilitation: Provide vocational rehab assistance to qualifying candidates.   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 Participant will state understanding/return demonstration of topics presented.  Learning Barriers/Preferences:     Learning Barriers/Preferences - 03/20/17 1610      Learning Barriers/Preferences   Learning Preferences Video;Written Material;Verbal Instruction;Skilled Demonstration;Pictoral      Education Topics: Count Your Pulse:  -Group instruction provided by verbal instruction, demonstration, patient participation and written materials to support subject.  Instructors address importance of being able to find your pulse and how to count your pulse when at home without a heart monitor.  Patients get hands on experience counting their pulse  with staff help and individually.   Heart Attack, Angina, and Risk Factor Modification:  -Group instruction provided by verbal instruction, video, and written materials to support subject.  Instructors address signs and symptoms of angina and heart attacks.    Also discuss risk factors for heart disease and how to make changes to improve heart health risk factors.   Functional Fitness:  -Group instruction provided by verbal instruction, demonstration, patient participation, and written materials to support subject.  Instructors address safety measures for doing things around the house.  Discuss how to get up and down off the floor, how to pick things up properly, how to safely get out of a chair without assistance, and balance training.   Meditation and Mindfulness:  -Group instruction provided by verbal instruction, patient participation, and written materials to support subject.  Instructor addresses importance of mindfulness and meditation practice to help reduce stress and improve awareness.  Instructor also leads participants through a meditation exercise.    Stretching for Flexibility and Mobility:  -Group instruction provided by verbal instruction, patient participation, and written materials to support subject.  Instructors lead participants through series of stretches that are designed to increase flexibility thus improving mobility.  These stretches are additional exercise for major muscle groups that are typically performed during regular warm  up and cool down.   Hands Only CPR:  -Group verbal, video, and participation provides a basic overview of AHA guidelines for community CPR. Role-play of emergencies allow participants the opportunity to practice calling for help and chest compression technique with discussion of AED use.   Hypertension: -Group verbal and written instruction that provides a basic overview of hypertension including the most recent diagnostic guidelines, risk  factor reduction with self-care instructions and medication management.    Nutrition I class: Heart Healthy Eating:  -Group instruction provided by PowerPoint slides, verbal discussion, and written materials to support subject matter. The instructor gives an explanation and review of the Therapeutic Lifestyle Changes diet recommendations, which includes a discussion on lipid goals, dietary fat, sodium, fiber, plant stanol/sterol esters, sugar, and the components of a well-balanced, healthy diet.   Nutrition II class: Lifestyle Skills:  -Group instruction provided by PowerPoint slides, verbal discussion, and written materials to support subject matter. The instructor gives an explanation and review of label reading, grocery shopping for heart health, heart healthy recipe modifications, and ways to make healthier choices when eating out.   Diabetes Question & Answer:  -Group instruction provided by PowerPoint slides, verbal discussion, and written materials to support subject matter. The instructor gives an explanation and review of diabetes co-morbidities, pre- and post-prandial blood glucose goals, pre-exercise blood glucose goals, signs, symptoms, and treatment of hypoglycemia and hyperglycemia, and foot care basics.   Diabetes Blitz:  -Group instruction provided by PowerPoint slides, verbal discussion, and written materials to support subject matter. The instructor gives an explanation and review of the physiology behind type 1 and type 2 diabetes, diabetes medications and rational behind using different medications, pre- and post-prandial blood glucose recommendations and Hemoglobin A1c goals, diabetes diet, and exercise including blood glucose guidelines for exercising safely.    Portion Distortion:  -Group instruction provided by PowerPoint slides, verbal discussion, written materials, and food models to support subject matter. The instructor gives an explanation of serving size versus  portion size, changes in portions sizes over the last 20 years, and what consists of a serving from each food group.   Stress Management:  -Group instruction provided by verbal instruction, video, and written materials to support subject matter.  Instructors review role of stress in heart disease and how to cope with stress positively.     Exercising on Your Own:  -Group instruction provided by verbal instruction, power point, and written materials to support subject.  Instructors discuss benefits of exercise, components of exercise, frequency and intensity of exercise, and end points for exercise.  Also discuss use of nitroglycerin and activating EMS.  Review options of places to exercise outside of rehab.  Review guidelines for sex with heart disease.   Cardiac Drugs I:  -Group instruction provided by verbal instruction and written materials to support subject.  Instructor reviews cardiac drug classes: antiplatelets, anticoagulants, beta blockers, and statins.  Instructor discusses reasons, side effects, and lifestyle considerations for each drug class.   Cardiac Drugs II:  -Group instruction provided by verbal instruction and written materials to support subject.  Instructor reviews cardiac drug classes: angiotensin converting enzyme inhibitors (ACE-I), angiotensin II receptor blockers (ARBs), nitrates, and calcium channel blockers.  Instructor discusses reasons, side effects, and lifestyle considerations for each drug class.   Anatomy and Physiology of the Circulatory System:  Group verbal and written instruction and models provide basic cardiac anatomy and physiology, with the coronary electrical and arterial systems. Review of: AMI, Angina, Valve disease, Heart  Failure, Peripheral Artery Disease, Cardiac Arrhythmia, Pacemakers, and the ICD.   Other Education:  -Group or individual verbal, written, or video instructions that support the educational goals of the cardiac rehab  program.   Knowledge Questionnaire Score:     Knowledge Questionnaire Score - 03/20/17 1153      Knowledge Questionnaire Score   Pre Score 22/24   DM 13/15      Core Components/Risk Factors/Patient Goals at Admission:     Personal Goals and Risk Factors at Admission - 03/20/17 1627      Core Components/Risk Factors/Patient Goals on Admission    Weight Management Yes;Weight Loss   Intervention Weight Management: Develop a combined nutrition and exercise program designed to reach desired caloric intake, while maintaining appropriate intake of nutrient and fiber, sodium and fats, and appropriate energy expenditure required for the weight goal.;Weight Management: Provide education and appropriate resources to help participant work on and attain dietary goals.   Admit Weight 170 lb 3.1 oz (77.2 kg)   Goal Weight: Short Term 164 lb (74.4 kg)   Goal Weight: Long Term 160 lb (72.6 kg)   Expected Outcomes Short Term: Continue to assess and modify interventions until short term weight is achieved;Long Term: Adherence to nutrition and physical activity/exercise program aimed toward attainment of established weight goal;Weight Loss: Understanding of general recommendations for a balanced deficit meal plan, which promotes 1-2 lb weight loss per week and includes a negative energy balance of [phone] kcal/d;Understanding recommendations for meals to include 15-35% energy as protein, 25-35% energy from fat, 35-60% energy from carbohydrates, less than 200mg  of dietary cholesterol, 20-35 gm of total fiber daily;Understanding of distribution of calorie intake throughout the day with the consumption of 4-5 meals/snacks   Hypertension Yes   Intervention Provide education on lifestyle modifcations including regular physical activity/exercise, weight management, moderate sodium restriction and increased consumption of fresh fruit, vegetables, and low fat dairy, alcohol moderation, and smoking cessation.;Monitor  prescription use compliance.   Expected Outcomes Short Term: Continued assessment and intervention until BP is < 140/6590mm HG in hypertensive participants. < 130/2580mm HG in hypertensive participants with diabetes, heart failure or chronic kidney disease.;Long Term: Maintenance of blood pressure at goal levels.   Lipids Yes   Intervention Provide education and support for participant on nutrition & aerobic/resistive exercise along with prescribed medications to achieve LDL 70mg , HDL >40mg .   Expected Outcomes Short Term: Participant states understanding of desired cholesterol values and is compliant with medications prescribed. Participant is following exercise prescription and nutrition guidelines.;Long Term: Cholesterol controlled with medications as prescribed, with individualized exercise RX and with personalized nutrition plan. Value goals: LDL < 70mg , HDL > 40 mg.      Core Components/Risk Factors/Patient Goals Review:    Core Components/Risk Factors/Patient Goals at Discharge (Final Review):    ITP Comments:     ITP Comments    Row Name 03/20/17 0824           ITP Comments Medical Director, Dr. Armanda Magicraci Turner          Comments: .Stacy Horton attended orientation from 0800 to 1000 to review rules and guidelines for program. Completed 6 minute walk test, Intitial ITP, and exercise prescription.  VSS. Telemetry-Sinus Rhythm.  Asymptomatic.Stacy LighterMaria Reid Nawrot, RN,BSN 03/20/2017 4:42 PM

## 2017-03-20 NOTE — Progress Notes (Signed)
Stacy CarrowSheliah A Weng 61 y.o. female       Nutrition Screen & Note  1. 11/08/16 Status post coronary artery stent placement to LAD    Past Medical History:  Diagnosis Date  . Coronary artery disease    a. 10/2016: LHC with 95% mLAD s/p DES, mod non obst dz in dLAD and OM1  . GERD (gastroesophageal reflux disease)   . Hemorrhoids   . Hyperlipidemia    a. intolerant to multiple statins and Zetia  . Hypertension   . IBS (irritable bowel syndrome)   . Lower extremity edema   . Obesity   . Refusal of blood transfusions as patient is Jehovah's Witness   . Type II diabetes mellitus (HCC)    Meds reviewed  HT: Ht Readings from Last 1 Encounters:  03/20/17 5\' 5"  (1.651 m)    WT: Wt Readings from Last 3 Encounters:  03/20/17 170 lb 3.1 oz (77.2 kg)  02/27/17 171 lb 9.6 oz (77.8 kg)  11/21/16 171 lb (77.6 kg)     BMI 28.4   Current tobacco use? No  Labs:  Lipid Panel     Component Value Date/Time   CHOL 201 (H) 02/27/2017 1115   TRIG 84 02/27/2017 1115   HDL 55 02/27/2017 1115   CHOLHDL 3.7 02/27/2017 1115   CHOLHDL 5.2 (H) 11/13/2015 1008   VLDL 18 11/13/2015 1008   LDLCALC 129 (H) 02/27/2017 1115   LDLDIRECT 129 (H) 12/30/2008 1930    Lab Results  Component Value Date   HGBA1C 6.8 (H) 02/27/2017   CBG (last 3)  No results for input(s): GLUCAP in the last 72 hours.  Nutrition Note Spoke with pt. Nutrition Plan and Nutrition Survey goals reviewed with pt. Pt is working towards following Step the Therapeutic Lifestyle Changes diet. Pt wants to lose wt. Pt has been not been actively trying to lose wt. Pt wants to lose wt by changing her diet and exercising. Per pt, "my doctor wants me to get down to 150 lb." Pt feels 150 lb is too low and would like her goal wt to be 160 lb. Pt is a diet-controlled diabetic. Last A1c indicates blood glucose well-controlled. Pt does not check her CBG's at home. Pt expressed understanding of the information reviewed. Pt aware of nutrition  education classes offered.  Lab Results  Component Value Date   HGBA1C 6.8 (H) 02/27/2017   Wt Readings from Last 3 Encounters:  03/20/17 170 lb 3.1 oz (77.2 kg)  02/27/17 171 lb 9.6 oz (77.8 kg)  11/21/16 171 lb (77.6 kg)   Nutrition Diagnosis ? Food-and nutrition-related knowledge deficit related to lack of exposure to information as related to diagnosis of: ? CVD ? DM ? Overweight related to excessive energy intake as evidenced by a BMI of 28.4  Nutrition Intervention ? Pt's individual nutrition plan reviewed with pt. ? Benefits of adopting Therapeutic Lifestyle Changes discussed when Medficts reviewed.   ? Continue client-centered nutrition education by RD, as part of interdisciplinary care.  Nutrition Goal(s):  ? Pt to identify and limit food sources of saturated fat, trans fat, and sodium by: a. Pt to cut down on bacon and sausage. b. Pt to pick healthier lunch foods. c. Pt to cut down on beef or pork high in saturated fat. d. Pt to eat more skinless chicken and [place]. e. Pt to use lower fat milk (2% instead of whole). f. Pt to choose trans fat-free margarine. g. Pt to decrease dietary sodium intake.  h.  Pt to watch out for sweets and added sugar. i. Pt to choose cold and frozen desserts carefully.  ? Pt to identify food quantities necessary to achieve weight loss of 6-10 lb  at graduation from cardiac rehab. Goal wt of 160 lb desired.   Plan:  Pt to attend nutrition classes  ? Nutrition I class ? Nutrition II class  ? Portion Distortion     ? Diabetes Blitz Class ? Diabetes Q & A class Will provide client-centered nutrition education as part of interdisciplinary care.   Monitor and evaluate progress toward nutrition goal with team.  Mickle Plumb, M.Ed, RD, LDN, CDE 03/20/2017 12:00 PM

## 2017-03-21 ENCOUNTER — Encounter (HOSPITAL_COMMUNITY): Payer: BLUE CROSS/BLUE SHIELD

## 2017-03-24 ENCOUNTER — Encounter (HOSPITAL_COMMUNITY)
Admission: RE | Admit: 2017-03-24 | Discharge: 2017-03-24 | Disposition: A | Discharge status: Home / Self Care | Payer: BLUE CROSS/BLUE SHIELD | Source: Ambulatory Visit | Attending: Cardiovascular Disease | Admitting: Cardiovascular Disease

## 2017-03-24 ENCOUNTER — Encounter (HOSPITAL_COMMUNITY): Payer: Self-pay

## 2017-03-24 DIAGNOSIS — Z955 Presence of coronary angioplasty implant and graft: Secondary | ICD-10-CM | POA: Diagnosis not present

## 2017-03-24 NOTE — Progress Notes (Signed)
Daily Session Note  Patient Details  Name: Stacy Horton MRN: [id number] Date of Birth: 1956/01/22 Referring Provider:     CARDIAC REHAB PHASE II ORIENTATION from 03/20/2017 in [place]  Referring Provider  Sanda Klein MD      Encounter Date: 03/24/2017  Check In:     Session Check In - 03/24/17 1437      Check-In   Location [facility]   Staff Present Cleda Mccreedy, MS, Exercise Physiologist;Amber Fair, MS, ACSM RCEP, Exercise Physiologist;Joann Rion, RN, Marga Melnick, RN, BSN   Supervising physician immediately available to respond to emergencies [facility] immediately available   Physician(s) Dr. Allyson Sabal   Medication changes reported     No   Fall or balance concerns reported    No   Tobacco Cessation No Change   Warm-up and Cool-down Performed as group-led instruction   Resistance Training Performed Yes   VAD Patient? No     Pain Assessment   Currently in Pain? No/denies      Capillary Blood Glucose: No results found for this or any previous visit (from the past 24 hour(s)).    History  Smoking Status  . Never Smoker  Smokeless Tobacco  . Never Used    Goals Met:  Exercise tolerated well  Goals Unmet:  Not Applicable  Comments: Pt started cardiac rehab today.  Pt tolerated light exercise without difficulty. VSS, telemetry-sinus rhythm,  asymptomatic.  Medication list reconciled. Pt denies barriers to medicaiton compliance.  PSYCHOSOCIAL ASSESSMENT:  PHQ-0. Pt exhibits positive coping skills, hopeful outlook with supportive family. No psychosocial needs identified at this time, no psychosocial interventions necessary.    Pt enjoys reading, traveling and watching movies.  Pt goals for cardiac rehab are to improve blood pressure, diabetes and weight risk factors. Pt encouraged to participate in exercise, nutrition and lifestyle modification education offerings.      Pt oriented to exercise  equipment and routine.    Understanding verbalized.   Dr. Fransico Him is Medical Director for Cardiac Rehab at [facility]

## 2017-03-26 ENCOUNTER — Encounter (HOSPITAL_COMMUNITY)
Admission: RE | Admit: 2017-03-26 | Discharge: 2017-03-26 | Disposition: A | Discharge status: Home / Self Care | Payer: BLUE CROSS/BLUE SHIELD | Source: Ambulatory Visit | Attending: Cardiovascular Disease | Admitting: Cardiovascular Disease

## 2017-03-26 ENCOUNTER — Ambulatory Visit: Payer: BLUE CROSS/BLUE SHIELD | Admitting: Family Medicine

## 2017-03-26 DIAGNOSIS — Z955 Presence of coronary angioplasty implant and graft: Secondary | ICD-10-CM

## 2017-03-26 LAB — GLUCOSE, CAPILLARY: GLUCOSE-CAPILLARY: 82 mg/dL (ref 65–99)

## 2017-03-28 ENCOUNTER — Encounter (HOSPITAL_COMMUNITY)
Admission: RE | Admit: 2017-03-28 | Discharge: 2017-03-28 | Disposition: A | Discharge status: Home / Self Care | Payer: BLUE CROSS/BLUE SHIELD | Source: Ambulatory Visit | Attending: Cardiovascular Disease | Admitting: Cardiovascular Disease

## 2017-03-28 DIAGNOSIS — Z955 Presence of coronary angioplasty implant and graft: Secondary | ICD-10-CM

## 2017-03-31 ENCOUNTER — Encounter (HOSPITAL_COMMUNITY)
Admission: RE | Admit: 2017-03-31 | Discharge: 2017-03-31 | Disposition: A | Discharge status: Home / Self Care | Payer: BLUE CROSS/BLUE SHIELD | Source: Ambulatory Visit | Attending: Cardiovascular Disease | Admitting: Cardiovascular Disease

## 2017-03-31 DIAGNOSIS — Z955 Presence of coronary angioplasty implant and graft: Secondary | ICD-10-CM | POA: Diagnosis not present

## 2017-04-04 ENCOUNTER — Encounter (HOSPITAL_COMMUNITY)
Admission: RE | Admit: 2017-04-04 | Discharge: 2017-04-04 | Disposition: A | Discharge status: Home / Self Care | Payer: BLUE CROSS/BLUE SHIELD | Source: Ambulatory Visit | Attending: Cardiovascular Disease | Admitting: Cardiovascular Disease

## 2017-04-04 DIAGNOSIS — Z955 Presence of coronary angioplasty implant and graft: Secondary | ICD-10-CM

## 2017-04-04 NOTE — Progress Notes (Signed)
Reviewed home exercise with pt today.  Pt plans to walk and ride stationary bike for exercise.  Reviewed THR, pulse, RPE, sign and symptoms, NTG use, and when to call 911 or MD.  Also discussed weather considerations and indoor options.  Pt voiced understanding.   Stacy Horton Genuine PartsFair,MS,ACSM RCEP

## 2017-04-07 ENCOUNTER — Encounter (HOSPITAL_COMMUNITY)
Admission: RE | Admit: 2017-04-07 | Discharge: 2017-04-07 | Disposition: A | Discharge status: Home / Self Care | Payer: BLUE CROSS/BLUE SHIELD | Source: Ambulatory Visit | Attending: Cardiovascular Disease | Admitting: Cardiovascular Disease

## 2017-04-07 DIAGNOSIS — Z955 Presence of coronary angioplasty implant and graft: Secondary | ICD-10-CM

## 2017-04-08 ENCOUNTER — Telehealth (HOSPITAL_COMMUNITY): Payer: Self-pay | Admitting: Cardiac Rehabilitation

## 2017-04-09 ENCOUNTER — Encounter (HOSPITAL_COMMUNITY)
Admission: RE | Admit: 2017-04-09 | Discharge: 2017-04-09 | Disposition: A | Discharge status: Home / Self Care | Payer: BLUE CROSS/BLUE SHIELD | Source: Ambulatory Visit | Attending: Cardiovascular Disease | Admitting: Cardiovascular Disease

## 2017-04-09 DIAGNOSIS — Z955 Presence of coronary angioplasty implant and graft: Secondary | ICD-10-CM

## 2017-04-11 ENCOUNTER — Encounter (HOSPITAL_COMMUNITY): Payer: BLUE CROSS/BLUE SHIELD

## 2017-04-14 ENCOUNTER — Encounter (HOSPITAL_COMMUNITY)
Admission: RE | Admit: 2017-04-14 | Discharge: 2017-04-14 | Disposition: A | Discharge status: Home / Self Care | Payer: BLUE CROSS/BLUE SHIELD | Source: Ambulatory Visit | Attending: Cardiovascular Disease | Admitting: Cardiovascular Disease

## 2017-04-14 ENCOUNTER — Ambulatory Visit: Payer: Self-pay | Admitting: Student in an Organized Health Care Education/Training Program

## 2017-04-14 ENCOUNTER — Encounter: Payer: Self-pay | Admitting: Student in an Organized Health Care Education/Training Program

## 2017-04-14 ENCOUNTER — Ambulatory Visit (INDEPENDENT_AMBULATORY_CARE_PROVIDER_SITE_OTHER): Payer: BLUE CROSS/BLUE SHIELD | Admitting: Student in an Organized Health Care Education/Training Program

## 2017-04-14 VITALS — BP 110/64 | HR 64 | Temp 98.1°F | Ht 65.0 in | Wt 168.2 lb

## 2017-04-14 DIAGNOSIS — D649 Anemia, unspecified: Secondary | ICD-10-CM | POA: Diagnosis not present

## 2017-04-14 DIAGNOSIS — K649 Unspecified hemorrhoids: Secondary | ICD-10-CM

## 2017-04-14 DIAGNOSIS — Z955 Presence of coronary angioplasty implant and graft: Secondary | ICD-10-CM | POA: Diagnosis not present

## 2017-04-14 NOTE — Progress Notes (Signed)
Opened in error

## 2017-04-14 NOTE — Telephone Encounter (Signed)
Pt informed to hold rosuvastatin due to joint pain  per Dr. Royann Shiversroitoru.  Pt verbalized understanding.

## 2017-04-14 NOTE — Patient Instructions (Addendum)
It was a pleasure seeing you today in our clinic. Today we discussed constipation. Here is the treatment plan we have discussed and agreed upon together:  We drew labs today. I will call you with these results. If you do not hear from me in one week please call our office.  Please schedule a visit for the end of august or early September for your next A1c.   Our clinic's number is [phone]. Please call with questions or concerns about what we discussed today.  Be well, Dr. Mosetta Putt  HIGH-FIBER DIET OVERVIEW - Eating a diet that is high in fiber has many potential health benefits, including a decreased risk of heart disease, stroke, and type 2 diabetes. Because high-fiber foods may be healthy for reasons other than their fiber content, the research has not always been able to determine if fiber is the healthful component. A high-fiber diet is a commonly recommended treatment for digestive problems, such as constipation, diarrhea, and hemorrhoids, although individual results vary widely, and the scientific evidence supporting these recommendations is weak. Fiber is normally found in beans, grains, vegetables, and fruits. However, most people do not eat as much fiber as is commonly recommended. This topic discusses what fiber is, why it is helpful, and how to increase dietary fiber.  WHAT IS FIBER? - There is no single dietary "fiber." Traditionally, fiber was considered that substance found in the outer layers of grains or plants and which was not digested in the intestines. Wheat bran, the outer layer of wheat grain, fit this model. We now know that "fiber" actually consists of a number of different substances. The term "dietary fiber" includes all of these substances and is now considered a better term than just "fiber." Most dietary fiber is not digested or absorbed, so it stays within the intestine where it modulates digestion of other foods and affects the consistency of stool. There are two types  of fiber, each of which is thought to have its own benefits: ?Soluble fiber consists of a group of substances that is made of carbohydrates and dissolves in water. Examples of foods that contain soluble fiber include fruits, oats, barley, and legumes (peas and beans). ?Insoluble fiber comes from plant cell walls and does not dissolve in water. Examples of foods that contain insoluble fiber include wheat, rye, and other grains. The traditional fiber, wheat bran, is a type of insoluble fiber. ?Dietary fiber is the sum of all soluble and insoluble fiber. BENEFITS OF A HIGH-FIBER DIET - The health effects of a high-fiber may depend to some extent on the type of fiber eaten. However, the difference between the health effects of the two types of fiber are not very clear and may vary between individuals, so many providers encourage adding fiber in whatever way is easiest for the patient. There are several potential benefits of eating a diet with high-fiber content: ?Insoluble fiber (wheat bran, and some fruits and vegetables) has been recommended to treat digestive problems such as constipation, hemorrhoids, chronic diarrhea, and fecal incontinence. Fiber bulks the stool, making it softer and easier to pass. Fiber helps the stool pass regularly, although it is not a laxative. (See "Patient education: Constipation in adults (Beyond the Basics)" and "Patient education: Hemorrhoids (Beyond the Basics)" and "Patient education: Chronic diarrhea in adults (Beyond the Basics)".) ?Soluble fiber (psyllium, pectin, wheat dextrin, and oat products) can reduce the risk of coronary artery disease and stroke by 40 to 50 percent (compared to a low fiber diet) [1,2]. ?Soluble fiber  can also reduce the risk of developing type 2 diabetes. In people who have diabetes (type 1 and 2), soluble fiber can help to control blood glucose levels.  ?It is not clear if a high-fiber diet is beneficial for people with irritable bowel syndrome or  diverticulosis. Fiber may be helpful for some people with these diagnoses while it may worsen symptoms in others.  HOW MUCH FIBER DO I NEED? - The recommended amount of dietary fiber is 20 to 35 grams per day. By reading the nutrition label on packaged foods, it is possible to determine the number of grams of dietary fiber per serving (figure 1). Dietary sources of fiber - The fiber content of many foods, including fruits and vegetables, is available in the table (table 1). Breakfast cereals can be a good source of fiber. Some fruits and vegetables are particularly helpful in treating constipation, such as prunes and prune juice. Other sources of fiber - For those who do not like high-fiber foods such as fruits, beans, and vegetables, a good source of fiber is unprocessed wheat bran; one to two tablespoons can be mixed with food. One tablespoon of wheat bran contains approximately 1.6 grams of fiber. In addition, a number of fiber supplements are available. Examples include psyllium, methylcellulose, wheat dextrin, and calcium polycarbophil. The dose of the fiber supplement should be increased slowly to prevent gas and cramping, and the supplement should be taken with adequate fluid. The fiber in these supplements is mostly of the soluble type. FIBER SIDE EFFECTS - Adding fiber to the diet can have some side effects, such as abdominal bloating or gas. This can sometimes be minimized by starting with a small amount and slowly increasing until stools become softer and more frequent. However, many people, including those with irritable bowel syndrome, cannot tolerate fiber supplements and do better by not increasing fiber in their diet. (See "Patient education: Irritable bowel syndrome (Beyond the Basics)".) WHERE TO GET MORE INFORMATION - Your healthcare provider is the best source of information for questions and concerns related to your medical problem. This article will be updated as needed on our web site  ([email or web address]). Related topics for patients, as well as selected articles written for healthcare professionals, are also available. Some of the most relevant are listed below.   Amount of fiber in different foods  Food Serving Grams of fiber  Fruits  Apple (with skin) 1 medium apple 4.4  Banana 1 medium banana 3.1  Oranges 1 orange 3.1  Prunes 1 cup, pitted 12.4  Juices  Apple, unsweetened, with added ascorbic acid 1 cup 0.5  Grapefruit, white, canned, sweetened 1 cup 0.2  Grape, unsweetened, with added ascorbic acid 1 cup 0.5  Orange 1 cup 0.7  Vegetables  Cooked  Green beans 1 cup 4.0  Carrots 1/2 cup sliced 2.3  Peas 1 cup 8.8  Potato (baked, with skin) 1 medium potato 3.8  Raw  Cucumber (with peel) 1 cucumber 1.5  Lettuce 1 cup shredded 0.5  Tomato 1 medium tomato 1.5  Spinach 1 cup 0.7  Legumes  Baked beans, canned, no salt added 1 cup 13.9  Kidney beans, canned 1 cup 13.6  Lima beans, canned 1 cup 11.6  Lentils, boiled 1 cup 15.6  Breads, pastas, flours  Bran muffins 1 medium muffin 5.2  Oatmeal, cooked 1 cup 4.0  White bread 1 slice 0.6  Whole-wheat bread 1 slice 1.9  Pasta and rice, cooked  Macaroni 1 cup 2.5  Rice, brown 1 cup  3.5  Rice, white 1 cup 0.6  Spaghetti (regular) 1 cup 2.5  Nuts  Almonds 1/2 cup 8.7  Peanuts 1/2 cup 7.9  To learn how much fiber and other nutrients are in different foods, visit the [place] [employer] manager[employer] (USDA) [employer] Nutrient Database at: https://www.rogers.biz/http://www.nal.usda.gov/fnic/foodcomp/search/.  Created using data from the [employer] National Nutrient Database for Standard Reference. Available at https://www.rogers.biz/http://www.nal.usda.gov/fnic/foodcomp/search/.  Graphic 1610952349 Version 4.0

## 2017-04-14 NOTE — Progress Notes (Signed)
   CC: rectal bleeding  HPI: Stacy Horton is a 61 y.o. female with PMH significant for CAD s/p PCI 10/2016 on ASA and plavix, as well as IBS with constipation, HLD, HTN, GERD who [facility] to [facility]establish care and discuss constipation and rectal bleeding.  IBS with constipation and BRBPR - intermittent constipation with hard stools - notes bright red blood on stools, previously diagnosed with hemorrhoids - pain with stooling - endorses taking colace/miralax sometimes - no symptoms of anemia: no palpitations, dyspnea, fatigue, light-headedness - does endorse taking B12 supplement for fatigue - history of anemia with most recent Hgb 10/2016 low at 10.9, previously 8.9 - of note per cards note patient is Jehovah's witness and has refused blood products in past - s/p PCI to the mid LAD with placement of a Synergy 3.0 x 16 mm drug-eluting stent in 10/2016 >> per patient, because she has to be on ASA and plavix s/p stent, she has put off going back to surgery for hemorrhoids  Review of Symptoms:  See HPI for ROS.   CC, SH/smoking status, and VS noted.  Objective: BP 110/64   Pulse 64   Temp 98.1 F (36.7 C) (Oral)   Ht 5\' 5"  (1.651 m)   Wt 168 lb 3.2 oz (76.3 kg)   SpO2 97%   BMI 27.99 kg/m  GEN: NAD, alert, cooperative, and pleasant. EYE: no conjunctival injection or pallor ENMT: MMM RESPIRATORY: clear to auscultation bilaterally with no wheezes, rhonchi or rales, good effort CV: RRR, no m/r/g, no peripheral edema, <3s capillary refill GI: soft, non-tender, non-distended, no hepatosplenomegaly SKIN: warm and dry, no rashes or lesions NEURO: II-XII grossly intact, normal gait, peripheral sensation intact PSYCH: AAOx3, appropriate affect  Assessment and plan:  Hemorrhoids - patient refused rectal exam at today's visit -  BRBPR and history of hemorrhoids - CBC and iron panel ordered today - continue miralax/colace PRN to have 1-2 soft stools per day -  information on dietary fiber provided   Orders Placed This Encounter  Procedures  . CBC  . Iron and TIBC    Stacy PouchLauren Meiko Stranahan, MD,MS,  PGY2 04/15/2017 9:44 AM

## 2017-04-15 NOTE — Assessment & Plan Note (Signed)
-   patient refused rectal exam at today's visit -  BRBPR and history of hemorrhoids - CBC and iron panel ordered today - continue miralax/colace PRN to have 1-2 soft stools per day - information on dietary fiber provided

## 2017-04-15 NOTE — Progress Notes (Signed)
Cardiac Individual Treatment Plan  Patient Details  Name: Stacy Horton MRN: [id number] Date of Birth: November 18, 1955 Referring Provider:     CARDIAC REHAB PHASE II ORIENTATION from 03/20/2017 in [facility] CARDIAC [facility]  Referring Provider  Croitoru, Rachelle Hora MD      Initial Encounter Date:    CARDIAC REHAB PHASE II ORIENTATION from 03/20/2017 in [facility] CARDIAC REHAB  Date  03/20/17  Referring Provider  Croitoru, Mihai MD      Visit Diagnosis: 11/08/16 Status post coronary artery stent placement to LAD  Patient's Home Medications on Admission:  Current Outpatient Prescriptions:  .  aspirin 81 MG chewable tablet, Chew 1 tablet (81 mg total) by mouth daily., Disp: , Rfl:  .  Biotin 5000 MCG TABS, Take 1 tablet by mouth daily., Disp: , Rfl:  .  carvedilol (COREG) 12.5 MG tablet, Take 1 tablet (12.5 mg total) by mouth 2 (two) times daily., Disp: 180 tablet, Rfl: 3 .  chlorhexidine (PERIDEX) 0.12 % solution, Use as directed 15 mLs in the mouth or throat 2 (two) times daily., Disp: , Rfl:  .  Cholecalciferol (VITAMIN D3) 2000 units CHEW, Chew 1 each by mouth daily., Disp: , Rfl:  .  clopidogrel (PLAVIX) 75 MG tablet, Take 1 tablet (75 mg total) by mouth daily with breakfast., Disp: 90 tablet, Rfl: 4 .  docusate sodium (COLACE) 100 MG capsule, Take 100 mg by mouth daily., Disp: , Rfl:  .  linaclotide (LINZESS) 145 MCG CAPS capsule, Take 145 mcg by mouth daily before breakfast., Disp: , Rfl:  .  lisinopril-hydrochlorothiazide (PRINZIDE,ZESTORETIC) 20-12.5 MG tablet, Take 2 tablets by mouth daily., Disp: 180 tablet, Rfl: 3 .  nitroGLYCERIN (NITROSTAT) 0.4 MG SL tablet, Place 1 tablet (0.4 mg total) under the tongue every 5 (five) minutes as needed., Disp: 25 tablet, Rfl: 3 .  pantoprazole (PROTONIX) 40 MG tablet, Take 1 tablet (40 mg total) by mouth daily., Disp: 90 tablet, Rfl: 3 .  polyethylene glycol (MIRALAX / GLYCOLAX) packet, Take 17 g by mouth  daily., Disp: , Rfl:  .  rosuvastatin (CRESTOR) 5 MG tablet, Take 1 tablet (5 mg total) by mouth daily., Disp: 30 tablet, Rfl: 11 .  valACYclovir (VALTREX) 500 MG tablet, Take 500 mg by mouth daily., Disp: , Rfl: 0  Past Medical History: Past Medical History:  Diagnosis Date  . Coronary artery disease    a. 10/2016: LHC with 95% mLAD s/p DES, mod non obst dz in dLAD and OM1  . GERD (gastroesophageal reflux disease)   . Hemorrhoids   . Hyperlipidemia    a. intolerant to multiple statins and Zetia  . Hypertension   . IBS (irritable bowel syndrome)   . Lower extremity edema   . Obesity   . Refusal of blood transfusions as patient is [employer]hov[employer]'s [employer]   . Type II diabetes mellitus (HCC)     Tobacco Use: History  Smoking Status  . Never Smoker  Smokeless Tobacco  . Never Used    Labs: Recent Review Flowsheet Data    Labs for ITP Cardiac and Pulmonary Rehab Latest Ref Rng & Units 07/05/2014 04/06/2015 10/23/2015 11/13/2015 02/27/2017   Cholestrol 100 - 199 mg/dL - - - 562(Z) 308(M)   LDLCALC 0 - 99 mg/dL - - - 578(I) 696(E)   LDLDIRECT mg/dL - - - - -   HDL >95 mg/dL - - - 28(U) 55   Trlycerides 0 - 149 mg/dL - - - 92 84  Hemoglobin A1c 4.8 - 5.6 % 6.1 6.1 6.4 - 6.8(H)      Capillary Blood Glucose: Lab Results  Component Value Date   GLUCAP 82 03/26/2017   GLUCAP 114 (H) 11/09/2016   GLUCAP 127 (H) 11/08/2016   GLUCAP 82 11/08/2016   GLUCAP 131 (H) 11/08/2016     Exercise Target Goals:    Exercise Program Goal: Individual exercise prescription set with THRR, safety & activity barriers. Participant demonstrates ability to understand and report RPE using BORG scale, to self-measure pulse accurately, and to acknowledge the importance of the exercise prescription.  Exercise Prescription Goal: Starting with aerobic activity 30 plus minutes a day, 3 days per week for initial exercise prescription. Provide home exercise prescription and guidelines that participant acknowledges  understanding prior to discharge.  Activity Barriers & Risk Stratification:     Activity Barriers & Cardiac Risk Stratification - 03/20/17 0825      Activity Barriers & Cardiac Risk Stratification   Activity Barriers Back Problems;Shortness of Breath   Cardiac Risk Stratification High      6 Minute Walk:     6 Minute Walk    Row Name 03/20/17 1157         6 Minute Walk   Phase Initial     Distance 1627 feet     Walk Time 6 minutes     # of Rest Breaks 0     MPH 3.1     METS 3.8     RPE 11     VO2 Peak 13.4     Symptoms No     Resting HR 65 bpm     Resting BP 122/74     Max Ex. HR 100 bpm     Max Ex. BP 132/86     2 Minute Post BP 128/74        Oxygen Initial Assessment:   Oxygen Re-Evaluation:   Oxygen Discharge (Final Oxygen Re-Evaluation):   Initial Exercise Prescription:     Initial Exercise Prescription - 03/20/17 1200      Date of Initial Exercise RX and Referring Provider   Date 03/20/17   Referring Provider Croitoru, Mihai MD     Treadmill   MPH 2.8   Grade 1   Minutes 10   METs 3.53     Bike   Level 0.8   Minutes 10   METs 3     NuStep   Level 3   SPM 85   Minutes 10   METs 2.8     Prescription Details   Frequency (times per week) 3   Duration Progress to 45 minutes of aerobic exercise without signs/symptoms of physical distress     Intensity   THRR 40-80% of Max Heartrate 63-126   Ratings of Perceived Exertion 11-13   Perceived Dyspnea 0-4     Progression   Progression Continue to progress workloads to maintain intensity without signs/symptoms of physical distress.     Resistance Training   Training Prescription Yes   Weight 2   Reps 10-15      Perform Capillary Blood Glucose checks as needed.  Exercise Prescription Changes:     Exercise Prescription Changes    Row Name 03/24/17 1303 04/15/17 1300           Response to Exercise   Blood Pressure (Admit) 114/72 120/80      Blood Pressure (Exercise) 140/64  138/50      Blood Pressure (Exit) 116/74 120/70  Heart Rate (Admit) 93 bpm 80 bpm      Heart Rate (Exercise) 104 bpm 122 bpm      Heart Rate (Exit) 69 bpm 80 bpm      Rating of Perceived Exertion (Exercise) 12 12      Symptoms none none      Comments pt oriented to exercise equipment and exercise safely and without discomfort  -      Duration Continue with 30 min of aerobic exercise without signs/symptoms of physical distress. Continue with 30 min of aerobic exercise without signs/symptoms of physical distress.      Intensity THRR unchanged THRR unchanged        Progression   Progression Continue to progress workloads to maintain intensity without signs/symptoms of physical distress. Continue to progress workloads to maintain intensity without signs/symptoms of physical distress.      Average METs 3 3        Resistance Training   Training Prescription Yes Yes      Weight 2lbs 3lbs      Reps 10-15 10-15      Time 10 Minutes 10 Minutes        Treadmill   MPH 2.6 2.6      Grade 2 2      Minutes 10 15      METs 3.71 3.71        Bike   Level 0.5 0.5      Minutes 10 15      METs 2.24 2.24        Home Exercise Plan   Plans to continue exercise at  - Home (comment)  walking and stationary bike      Frequency  - Add 2 additional days to program exercise sessions.      Initial Home Exercises Provided  - 04/04/17         Exercise Comments:     Exercise Comments    Row Name 04/15/17 1305           Exercise Comments Reviewed METs and goals. Pt is active at home and recently started HEP. Pt will continue to improve in cardiorespiratory fitness,          Exercise Goals and Review:     Exercise Goals    Row Name 03/20/17 0825 03/20/17 0830           Exercise Goals   Increase Physical Activity Yes  -      Intervention Provide advice, education, support and counseling about physical activity/exercise needs.;Develop an individualized exercise prescription for aerobic  and resistive training based on initial evaluation findings, risk stratification, comorbidities and participant's personal goals.  -      Expected Outcomes Achievement of increased cardiorespiratory fitness and enhanced flexibility, muscular endurance and strength shown through measurements of functional capacity and personal statement of participant.  -      Increase Strength and Stamina Yes -  get back to dancing and be able to climb stairs without SOB. Learn activity/exercise limitations      Intervention Provide advice, education, support and counseling about physical activity/exercise needs.;Develop an individualized exercise prescription for aerobic and resistive training based on initial evaluation findings, risk stratification, comorbidities and participant's personal goals.  -      Expected Outcomes Achievement of increased cardiorespiratory fitness and enhanced flexibility, muscular endurance and strength shown through measurements of functional capacity and personal statement of participant.  -         Exercise Goals Re-Evaluation :  Exercise Goals Re-Evaluation    Row Name 04/04/17 1531             Exercise Goal Re-Evaluation   Exercise Goals Review Increase Physical Activity;Increase Strenth and Stamina       Comments Reviewed home exercise with pt today.  Pt plans to walk and ride stationary bike for exercise.  Reviewed THR, pulse, RPE, sign and symptoms, NTG use, and when to call 911 or MD.  Also discussed weather considerations and indoor options.  Pt voiced understanding.       Expected Outcomes Pt will build on aerobic capacity           Discharge Exercise Prescription (Final Exercise Prescription Changes):     Exercise Prescription Changes - 04/15/17 1300      Response to Exercise   Blood Pressure (Admit) 120/80   Blood Pressure (Exercise) 138/50   Blood Pressure (Exit) 120/70   Heart Rate (Admit) 80 bpm   Heart Rate (Exercise) 122 bpm   Heart Rate (Exit)  80 bpm   Rating of Perceived Exertion (Exercise) 12   Symptoms none   Duration Continue with 30 min of aerobic exercise without signs/symptoms of physical distress.   Intensity THRR unchanged     Progression   Progression Continue to progress workloads to maintain intensity without signs/symptoms of physical distress.   Average METs 3     Resistance Training   Training Prescription Yes   Weight 3lbs   Reps 10-15   Time 10 Minutes     Treadmill   MPH 2.6   Grade 2   Minutes 15   METs 3.71     Bike   Level 0.5   Minutes 15   METs 2.24     Home Exercise Plan   Plans to continue exercise at Home (comment)  walking and stationary bike   Frequency Add 2 additional days to program exercise sessions.   Initial Home Exercises Provided 04/04/17      Nutrition:  Target Goals: Understanding of nutrition guidelines, daily intake of sodium 1500mg , cholesterol 200mg , calories 30% from fat and 7% or less from saturated fats, daily to have 5 or more servings of fruits and vegetables.  Biometrics:     Pre Biometrics - 03/20/17 1636      Pre Biometrics   Height 5\' 5"  (1.651 m)   Weight 170 lb 3.1 oz (77.2 kg)   Waist Circumference 36.5 inches   Hip Circumference 40.5 inches   Waist to Hip Ratio 0.9 %   BMI (Calculated) 28.4   Triceps Skinfold 32 mm   % Body Fat 40 %   Grip Strength 34 kg   Flexibility 12 in   Single Leg Stand 30 seconds       Nutrition Therapy Plan and Nutrition Goals:     Nutrition Therapy & Goals - 03/20/17 1214      Nutrition Therapy   Diet Carb Modified, Therapeutic Lifestyle Changes     Personal Nutrition Goals   Nutrition Goal Wt loss of 1-2 lb/week to a wt loss goal wt of 6-10 lb at graduation from Cardiac Rehab. Pt's goal wt is 160 lb.    Personal Goal #2 Pt to identify and limit food sources of saturated fat, trans fat, and sodium.      Intervention Plan   Intervention Prescribe, educate and counsel regarding individualized specific  dietary modifications aiming towards targeted core components such as weight, hypertension, lipid management, diabetes, heart failure and other comorbidities.;Nutrition handout(s)  given to patient.  MEDFICTS results handout   Expected Outcomes Short Term Goal: Understand basic principles of dietary content, such as calories, fat, sodium, cholesterol and nutrients.;Long Term Goal: Adherence to prescribed nutrition plan.      Nutrition Discharge: Nutrition Scores:     Nutrition Assessments - 03/20/17 1216      MEDFICTS Scores   Pre Score 112      Nutrition Goals Re-Evaluation:   Nutrition Goals Re-Evaluation:   Nutrition Goals Discharge (Final Nutrition Goals Re-Evaluation):   Psychosocial: Target Goals: Acknowledge presence or absence of significant depression and/or stress, maximize coping skills, provide positive support system. Participant is able to verbalize types and ability to use techniques and skills needed for reducing stress and depression.  Initial Review & Psychosocial Screening:     Initial Psych Review & Screening - 03/20/17 1633      Initial Review   Current issues with None Identified     Family Dynamics   Good Support System? Yes     Barriers   Psychosocial barriers to participate in program There are no identifiable barriers or psychosocial needs.     Screening Interventions   Interventions Encouraged to exercise      Quality of Life Scores:     Quality of Life - 03/26/17 1411      Quality of Life Scores   Health/Function Pre 20.37 %  QOL scores reviewed.  overall scores mild low.  pt mostly concerned about her recent cardiac event and risk factors.      Socioeconomic Pre 20.94 %   Psych/Spiritual Pre 24 %   Family Pre 20.2 %   GLOBAL Pre 21.2 %  pt encouraged to participate in CR exercise, nutrition and lifestyle education opportunities to reduce risk factors.   pt offered emotional support and reassurance.        PHQ-9: Recent Review  Flowsheet Data    Depression screen [facility] 2/9 04/14/2017 03/24/2017 10/23/2015 07/21/2015 05/12/2015   Decreased Interest 0 0 0 0 0   Down, Depressed, Hopeless 0 0 0 0 0   PHQ - 2 Score 0 0 0 0 0     Interpretation of Total Score  Total Score Depression Severity:  1-4 = Minimal depression, 5-9 = Mild depression, 10-14 = Moderate depression, 15-19 = Moderately severe depression, 20-27 = Severe depression   Psychosocial Evaluation and Intervention:     Psychosocial Evaluation - 03/24/17 1638      Psychosocial Evaluation & Interventions   Interventions Encouraged to exercise with the program and follow exercise prescription   Comments no psychosocial needs identified, no interventions necessary    Expected Outcomes pt will exhibit positive outlook with good coping skills.    Continue Psychosocial Services  No Follow up required      Psychosocial Re-Evaluation:     Psychosocial Re-Evaluation    Row Name 04/15/17 1735             Psychosocial Re-Evaluation   Current issues with None Identified       Comments no psychosocial needs identified, no interventions necessary        Expected Outcomes pt will exhibit positive outlook wtih good coping skills       Interventions Encouraged to attend Cardiac Rehabilitation for the exercise;Stress management education;Relaxation education       Continue Psychosocial Services  No Follow up required          Psychosocial Discharge (Final Psychosocial Re-Evaluation):     Psychosocial Re-Evaluation - 04/15/17  1735      Psychosocial Re-Evaluation   Current issues with None Identified   Comments no psychosocial needs identified, no interventions necessary    Expected Outcomes pt will exhibit positive outlook wtih good coping skills   Interventions Encouraged to attend Cardiac Rehabilitation for the exercise;Stress management education;Relaxation education   Continue Psychosocial Services  No Follow up required      Vocational  Rehabilitation: Provide vocational rehab assistance to qualifying candidates.   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 Participant will state understanding/return demonstration of topics presented.  Learning Barriers/Preferences:     Learning Barriers/Preferences - 03/20/17 1610      Learning Barriers/Preferences   Learning Preferences Video;Written Material;Verbal Instruction;Skilled Demonstration;Pictoral      Education Topics: Count Your Pulse:  -Group instruction provided by verbal instruction, demonstration, patient participation and written materials to support subject.  Instructors address importance of being able to find your pulse and how to count your pulse when at home without a heart monitor.  Patients get hands on experience counting their pulse with staff help and individually.   CARDIAC REHAB PHASE II EXERCISE from 04/04/2017 in [facility] CARDIAC REHAB  Date  04/04/17  Instruction Review Code  2- meets goals/outcomes      Heart Attack, Angina, and Risk Factor Modification:  -Group instruction provided by verbal instruction, video, and written materials to support subject.  Instructors address signs and symptoms of angina and heart attacks.    Also discuss risk factors for heart disease and how to make changes to improve heart health risk factors.   Functional Fitness:  -Group instruction provided by verbal instruction, demonstration, patient participation, and written materials to support subject.  Instructors address safety measures for doing things around the house.  Discuss how to get up and down off  the floor, how to pick things up properly, how to safely get out of a chair without assistance, and balance training.   Meditation and Mindfulness:  -Group instruction provided by verbal instruction, patient participation, and written materials to support subject.  Instructor addresses importance of mindfulness and meditation practice to help reduce stress and improve awareness.  Instructor also leads participants through a meditation exercise.    Stretching for Flexibility and Mobility:  -Group instruction provided by verbal instruction, patient participation, and written materials to support subject.  Instructors lead participants through series of stretches that are designed to increase flexibility thus improving mobility.  These stretches are additional exercise for major muscle groups that are typically performed during regular warm up and cool down.   Hands Only CPR:  -Group verbal, video, and participation provides a basic overview of AHA guidelines for community CPR. Role-play of emergencies allow participants the opportunity to practice calling for help and chest compression technique with discussion of AED use.   Hypertension: -Group verbal and written instruction that provides a basic overview of hypertension including the most recent diagnostic guidelines, risk factor reduction with self-care instructions and medication management.    Nutrition I class: Heart Healthy Eating:  -Group instruction provided by PowerPoint slides, verbal discussion, and written materials to support subject matter.  The instructor gives an explanation and review of the Therapeutic Lifestyle Changes diet recommendations, which includes a discussion on lipid goals, dietary fat, sodium, fiber, plant stanol/sterol esters, sugar, and the components of a well-balanced, healthy diet.   Nutrition II class: Lifestyle Skills:  -Group instruction provided by PowerPoint slides, verbal discussion, and written  materials to support subject matter. The instructor gives an explanation and review of label reading, grocery shopping for heart health, heart healthy recipe modifications, and ways to make healthier choices when eating out.   Diabetes Question & Answer:  -Group instruction provided by PowerPoint slides, verbal discussion, and written materials to support subject matter. The instructor gives an explanation and review of diabetes co-morbidities, pre- and post-prandial blood glucose goals, pre-exercise blood glucose goals, signs, symptoms, and treatment of hypoglycemia and hyperglycemia, and foot care basics.   Diabetes Blitz:  -Group instruction provided by PowerPoint slides, verbal discussion, and written materials to support subject matter. The instructor gives an explanation and review of the physiology behind type 1 and type 2 diabetes, diabetes medications and rational behind using different medications, pre- and post-prandial blood glucose recommendations and Hemoglobin A1c goals, diabetes diet, and exercise including blood glucose guidelines for exercising safely.    Portion Distortion:  -Group instruction provided by PowerPoint slides, verbal discussion, written materials, and food models to support subject matter. The instructor gives an explanation of serving size versus portion size, changes in portions sizes over the last 20 years, and what consists of a serving from each food group.   Stress Management:  -Group instruction provided by verbal instruction, video, and written materials to support subject matter.  Instructors review role of stress in heart disease and how to cope with stress positively.     [facility]ARD[facility]AC [facility]  Date  03/26/17  Instruction Review Code  2- meets goals/outcomes      Exercising on Your Own:  -Group instruction provided by verbal instruction, power point, and written materials to  support subject.  Instructors discuss benefits of exercise, components of exercise, frequency and intensity of exercise, and end points for exercise.  Also discuss use of nitroglycerin and activating EMS.  Review options of places to exercise outside of rehab.  Review guidelines for sex with heart disease.   Cardiac Drugs I:  -Group instruction provided by verbal instruction and written materials to support subject.  Instructor reviews cardiac drug classes: antiplatelets, anticoagulants, beta blockers, and statins.  Instructor discusses reasons, side effects, and lifestyle considerations for each drug class.   Cardiac Drugs II:  -Group instruction provided by verbal instruction and written materials to support subject.  Instructor reviews cardiac drug classes: angiotensin converting enzyme inhibitors (ACE-I), angiotensin II receptor blockers (ARBs), nitrates, and calcium channel blockers.  Instructor discusses reasons, side effects, and lifestyle considerations for each drug class.   Anatomy and Physiology of the Circulatory System:  Group verbal and written instruction and models provide basic cardiac anatomy and physiology, with the coronary electrical and arterial systems. Review of: AMI, Angina, Valve disease, Heart Failure, Peripheral Artery Disease, Cardiac Arrhythmia, Pacemakers, and the ICD.   Other Education:  -Group or individual verbal, written, or video instructions that support the educational goals of the cardiac rehab program.   Knowledge Questionnaire Score:     Knowledge Questionnaire Score - 03/20/17 1153      Knowledge Questionnaire Score   Pre Score 22/24   DM 13/15  Core Components/Risk Factors/Patient Goals at Admission:     Personal Goals and Risk Factors at Admission - 03/20/17 1627      Core Components/Risk Factors/Patient Goals on Admission    Weight Management Yes;Weight Loss   Intervention Weight Management: Develop a combined nutrition and exercise  program designed to reach desired caloric intake, while maintaining appropriate intake of nutrient and fiber, sodium and fats, and appropriate energy expenditure required for the weight goal.;Weight Management: Provide education and appropriate resources to help participant work on and attain dietary goals.   Admit Weight 170 lb 3.1 oz (77.2 kg)   Goal Weight: Short Term 164 lb (74.4 kg)   Goal Weight: Long Term 160 lb (72.6 kg)   Expected Outcomes Short Term: Continue to assess and modify interventions until short term weight is achieved;Long Term: Adherence to nutrition and physical activity/exercise program aimed toward attainment of established weight goal;Weight Loss: Understanding of general recommendations for a balanced deficit meal plan, which promotes 1-2 lb weight loss per week and includes a negative energy balance of [phone] kcal/d;Understanding recommendations for meals to include 15-35% energy as protein, 25-35% energy from fat, 35-60% energy from carbohydrates, less than 200mg  of dietary cholesterol, 20-35 gm of total fiber daily;Understanding of distribution of calorie intake throughout the day with the consumption of 4-5 meals/snacks   Hypertension Yes   Intervention Provide education on lifestyle modifcations including regular physical activity/exercise, weight management, moderate sodium restriction and increased consumption of fresh fruit, vegetables, and low fat dairy, alcohol moderation, and smoking cessation.;Monitor prescription use compliance.   Expected Outcomes Short Term: Continued assessment and intervention until BP is < 140/25mm HG in hypertensive participants. < 130/76mm HG in hypertensive participants with diabetes, heart failure or chronic kidney disease.;Long Term: Maintenance of blood pressure at goal levels.   Lipids Yes   Intervention Provide education and support for participant on nutrition & aerobic/resistive exercise along with prescribed medications to achieve  LDL 70mg , HDL >40mg .   Expected Outcomes Short Term: Participant states understanding of desired cholesterol values and is compliant with medications prescribed. Participant is following exercise prescription and nutrition guidelines.;Long Term: Cholesterol controlled with medications as prescribed, with individualized exercise RX and with personalized nutrition plan. Value goals: LDL < 70mg , HDL > 40 mg.      Core Components/Risk Factors/Patient Goals Review:      Goals and Risk Factor Review    Row Name 04/15/17 1729             Core Components/Risk Factors/Patient Goals Review   Personal Goals Review Weight Management/Obesity;Hypertension;Lipids       Review pt with multiple RF demonstrates eagerness to participate in CR activities. unfortunately pt does not tolerate statins due to muscle/joint aches       Expected Outcomes pt will participate in CR exercise, nutrition and lifestyle modification education to decrease overall CAD RF          Core Components/Risk Factors/Patient Goals at Discharge (Final Review):      Goals and Risk Factor Review - 04/15/17 1729      Core Components/Risk Factors/Patient Goals Review   Personal Goals Review Weight Management/Obesity;Hypertension;Lipids   Review pt with multiple RF demonstrates eagerness to participate in CR activities. unfortunately pt does not tolerate statins due to muscle/joint aches   Expected Outcomes pt will participate in CR exercise, nutrition and lifestyle modification education to decrease overall CAD RF      ITP Comments:     ITP Comments    Row Name  03/20/17 9147 04/15/17 1729         ITP Comments Medical Director, Dr. Armanda Magic Medical Director, Dr. Armanda Magic         Comments: Pt is making expected progress toward personal goals after completing 9 sessions. Recommend continued exercise and life style modification education including  stress management and relaxation techniques to decrease cardiac risk  profile.

## 2017-04-16 ENCOUNTER — Encounter (HOSPITAL_COMMUNITY)
Admission: RE | Admit: 2017-04-16 | Discharge: 2017-04-16 | Disposition: A | Discharge status: Home / Self Care | Payer: BLUE CROSS/BLUE SHIELD | Source: Ambulatory Visit | Attending: Cardiovascular Disease | Admitting: Cardiovascular Disease

## 2017-04-16 ENCOUNTER — Telehealth: Payer: Self-pay | Admitting: Student in an Organized Health Care Education/Training Program

## 2017-04-16 DIAGNOSIS — Z955 Presence of coronary angioplasty implant and graft: Secondary | ICD-10-CM | POA: Diagnosis not present

## 2017-04-16 LAB — IRON AND TIBC
Iron Saturation: 6 % — CL (ref 15–55)
Iron: 22 ug/dL — ABNORMAL LOW (ref 27–139)
Total Iron Binding Capacity: 376 ug/dL (ref 250–450)
UIBC: 354 ug/dL (ref 118–369)

## 2017-04-16 LAB — CBC
HEMATOCRIT: 26.4 % — AB (ref 34.0–46.6)
Hemoglobin: 8.6 g/dL — ABNORMAL LOW (ref 11.1–15.9)
MCH: 27 pg (ref 26.6–33.0)
MCHC: 32.6 g/dL (ref 31.5–35.7)
MCV: 83 fL (ref 79–97)
PLATELETS: 320 10*3/uL (ref 150–379)
RBC: 3.18 x10E6/uL — ABNORMAL LOW (ref 3.77–5.28)
RDW: 14.6 % (ref 12.3–15.4)
WBC: 5.4 10*3/uL (ref 3.4–10.8)

## 2017-04-16 NOTE — Telephone Encounter (Signed)
Called and LVM to give patient the results of her blood tests from her office visit this week.  I will try to call patient again.   - Hgb low at 8.6 (transfusion threshold 8.0 because of her cardiac history) - Iron supplementation will be appropriate - short interval follow up CBC will be appropriate - likely needs surgery referral for hemorrhoids given she is this anemic presumably from [facility]BPR - needs return precautions  I will try patient again shortly so I can discuss this information with her.

## 2017-04-16 NOTE — Progress Notes (Signed)
JYLIAN PAPPALARDO 61 y.o. female DOB May 18, 1956  MRN [id number]      Nutrition Note Nutrition Diagnosis ? Food-and nutrition-related knowledge deficit related to lack of exposure to information as related to diagnosis of: ? CVD ? DM ? Overweight related to excessive energy intake as evidenced by a BMI of 28.4  Nutrition Goal(s):  ? Pt to identify and limit food sources of saturated fat, trans fat, and sodium by: a. Pt to cut down on bacon and sausage. - pt has decreased her consumption to bacon/sausage 2 times/d b. Pt to pick healthier lunch foods. - met; pt choosing [place] or roast beef more often c. Pt to cut down on beef or pork high in saturated fat. - pt is working towards this d. Pt to eat more skinless chicken and [place]. - pt is eating less skin e. Pt to use lower fat milk (2% instead of whole). - met f. Pt to choose trans fat-free margarine. - met g. Pt to decrease dietary sodium intake. - in progress h. Pt to watch out for sweets and added sugar. - in progress i. Pt to choose cold and frozen desserts carefully. - in progress  ? Pt to identify food quantities necessary to achieve weight loss of 6-10 lb at graduation from cardiac rehab. Goal wt of 160 lb desired.  - pt wt is down 2 lb from 179 lb to 167.4 lb (76.1 kg) Note Spoke with pt. Nutrition Plan and Nutrition Survey goals reviewed with pt. Pt is working towards following  the Therapeutic Lifestyle Changes diet. Pt wants to lose wt. Pt has lost 2 lb since admission. Pt is a diet-controlled diabetic and does not check her CBG's at home. Pt expressed understanding of the information reviewed. Pt aware of nutrition education classes offered and is unable to attend nutrition classes.  Nutrition Intervention ? Pt's individual nutrition plan reviewed with pt. ? Benefits of adopting Therapeutic Lifestyle Changes discussed when [employer] reviewed.   ? Pt given handouts for: ? Nutrition I class ? Nutrition II class ? Diabetes Blitz  Class   Plan:  Pt to attend nutrition classes  ? Portion Distortion ? Diabetes Q & A class Will provide client-centered nutrition education as part of interdisciplinary care.   Monitor and evaluate progress toward nutrition goal with team.  Derek Mound, M.Ed, RD, LDN, CDE 04/16/2017 2:48 PM

## 2017-04-18 ENCOUNTER — Telehealth: Payer: Self-pay | Admitting: Student in an Organized Health Care Education/Training Program

## 2017-04-18 ENCOUNTER — Encounter (HOSPITAL_COMMUNITY)
Admission: RE | Admit: 2017-04-18 | Discharge: 2017-04-18 | Disposition: A | Discharge status: Home / Self Care | Payer: BLUE CROSS/BLUE SHIELD | Source: Ambulatory Visit | Attending: Cardiovascular Disease | Admitting: Cardiovascular Disease

## 2017-04-18 ENCOUNTER — Other Ambulatory Visit: Payer: Self-pay | Admitting: Student in an Organized Health Care Education/Training Program

## 2017-04-18 DIAGNOSIS — D509 Iron deficiency anemia, unspecified: Secondary | ICD-10-CM

## 2017-04-18 DIAGNOSIS — Z955 Presence of coronary angioplasty implant and graft: Secondary | ICD-10-CM | POA: Diagnosis not present

## 2017-04-18 DIAGNOSIS — K649 Unspecified hemorrhoids: Secondary | ICD-10-CM

## 2017-04-18 MED ORDER — FERROUS SULFATE 325 (65 FE) MG PO TABS: 325.0000 mg | ORAL_TABLET | Freq: Every day | ORAL | 3 refills | Status: DC

## 2017-04-18 NOTE — Progress Notes (Signed)
Spoke with patient on the phone about Hgb 8.6 - initiate ferrous sulfate 325 qd - titrate miralax as needed  - f/u lab work CBC in 3 weeks - return precautions provided - internal referral to general surgery for bleeding hemorrhoids was placed  Patient voiced understanding and is in agreement with this plan.  Howard PouchLauren Shyah Cadmus, MD PGY-2 [facility]

## 2017-04-18 NOTE — Telephone Encounter (Signed)
error 

## 2017-04-21 ENCOUNTER — Encounter (HOSPITAL_COMMUNITY)
Admission: RE | Admit: 2017-04-21 | Discharge: 2017-04-21 | Disposition: A | Discharge status: Home / Self Care | Payer: BLUE CROSS/BLUE SHIELD | Source: Ambulatory Visit | Attending: Cardiovascular Disease | Admitting: Cardiovascular Disease

## 2017-04-21 DIAGNOSIS — Z955 Presence of coronary angioplasty implant and graft: Secondary | ICD-10-CM | POA: Diagnosis not present

## 2017-04-23 ENCOUNTER — Encounter (HOSPITAL_COMMUNITY): Payer: BLUE CROSS/BLUE SHIELD

## 2017-04-23 ENCOUNTER — Telehealth (HOSPITAL_COMMUNITY): Payer: Self-pay | Admitting: Cardiac Rehabilitation

## 2017-04-23 ENCOUNTER — Telehealth (HOSPITAL_COMMUNITY): Payer: Self-pay | Admitting: Student in an Organized Health Care Education/Training Program

## 2017-04-23 NOTE — Telephone Encounter (Signed)
pc received from pt c/o "cold" sensation in legs while walking hills, generalized fatigue and malaise over past few days.  Pt reports symptoms have not decreased her ability to participate in usual activities, however pt did withhold zestoretic from fear this medication could be producing her symptoms. Pt denies fever, pain, congestion.  Does does endorse nasal stuffiness and allergy symptoms. Pt instructed to take medication as prescribed unless directed to hold by MD. Pt also instructed to discuss symptoms and concerns with MD especially if symptoms persist or worsen. Pt offered emotional support and reassurance.  Pt plans to return to rehab at next scheduled appointment.

## 2017-04-25 ENCOUNTER — Encounter (HOSPITAL_COMMUNITY)
Admission: RE | Admit: 2017-04-25 | Discharge: 2017-04-25 | Disposition: A | Discharge status: Home / Self Care | Payer: BLUE CROSS/BLUE SHIELD | Source: Ambulatory Visit | Attending: Cardiovascular Disease | Admitting: Cardiovascular Disease

## 2017-04-25 DIAGNOSIS — Z955 Presence of coronary angioplasty implant and graft: Secondary | ICD-10-CM | POA: Diagnosis not present

## 2017-04-28 ENCOUNTER — Encounter (HOSPITAL_COMMUNITY)
Admission: RE | Admit: 2017-04-28 | Discharge: 2017-04-28 | Disposition: A | Discharge status: Home / Self Care | Payer: BLUE CROSS/BLUE SHIELD | Source: Ambulatory Visit | Attending: Cardiovascular Disease | Admitting: Cardiovascular Disease

## 2017-04-28 DIAGNOSIS — Z955 Presence of coronary angioplasty implant and graft: Secondary | ICD-10-CM | POA: Diagnosis not present

## 2017-04-30 ENCOUNTER — Encounter (HOSPITAL_COMMUNITY): Payer: BLUE CROSS/BLUE SHIELD

## 2017-04-30 ENCOUNTER — Telehealth (HOSPITAL_COMMUNITY): Payer: Self-pay | Admitting: Student in an Organized Health Care Education/Training Program

## 2017-05-02 ENCOUNTER — Encounter (HOSPITAL_COMMUNITY)
Admission: RE | Admit: 2017-05-02 | Discharge: 2017-05-02 | Disposition: A | Discharge status: Home / Self Care | Payer: BLUE CROSS/BLUE SHIELD | Source: Ambulatory Visit | Attending: Cardiovascular Disease | Admitting: Cardiovascular Disease

## 2017-05-02 DIAGNOSIS — Z955 Presence of coronary angioplasty implant and graft: Secondary | ICD-10-CM | POA: Diagnosis not present

## 2017-05-05 ENCOUNTER — Encounter (HOSPITAL_COMMUNITY)
Admission: RE | Admit: 2017-05-05 | Discharge: 2017-05-05 | Disposition: A | Discharge status: Home / Self Care | Payer: BLUE CROSS/BLUE SHIELD | Source: Ambulatory Visit | Attending: Cardiovascular Disease | Admitting: Cardiovascular Disease

## 2017-05-05 DIAGNOSIS — Z955 Presence of coronary angioplasty implant and graft: Secondary | ICD-10-CM

## 2017-05-07 ENCOUNTER — Encounter (HOSPITAL_COMMUNITY)
Admission: RE | Admit: 2017-05-07 | Discharge: 2017-05-07 | Disposition: A | Discharge status: Home / Self Care | Payer: BLUE CROSS/BLUE SHIELD | Source: Ambulatory Visit | Attending: Cardiovascular Disease | Admitting: Cardiovascular Disease

## 2017-05-07 DIAGNOSIS — Z955 Presence of coronary angioplasty implant and graft: Secondary | ICD-10-CM

## 2017-05-09 ENCOUNTER — Encounter (HOSPITAL_COMMUNITY)
Admission: RE | Admit: 2017-05-09 | Discharge: 2017-05-09 | Disposition: A | Discharge status: Home / Self Care | Payer: BLUE CROSS/BLUE SHIELD | Source: Ambulatory Visit | Attending: Cardiovascular Disease | Admitting: Cardiovascular Disease

## 2017-05-09 DIAGNOSIS — Z955 Presence of coronary angioplasty implant and graft: Secondary | ICD-10-CM | POA: Diagnosis not present

## 2017-05-12 ENCOUNTER — Encounter (HOSPITAL_COMMUNITY)
Admission: RE | Admit: 2017-05-12 | Discharge: 2017-05-12 | Disposition: A | Discharge status: Home / Self Care | Payer: BLUE CROSS/BLUE SHIELD | Source: Ambulatory Visit | Attending: Cardiovascular Disease | Admitting: Cardiovascular Disease

## 2017-05-12 DIAGNOSIS — Z955 Presence of coronary angioplasty implant and graft: Secondary | ICD-10-CM | POA: Diagnosis not present

## 2017-05-14 ENCOUNTER — Encounter (HOSPITAL_COMMUNITY)
Admission: RE | Admit: 2017-05-14 | Discharge: 2017-05-14 | Disposition: A | Discharge status: Home / Self Care | Payer: BLUE CROSS/BLUE SHIELD | Source: Ambulatory Visit | Attending: Cardiovascular Disease | Admitting: Cardiovascular Disease

## 2017-05-14 DIAGNOSIS — Z955 Presence of coronary angioplasty implant and graft: Secondary | ICD-10-CM

## 2017-05-15 NOTE — Progress Notes (Signed)
Cardiac Individual Treatment Plan  Patient Details  Name: Stacy Horton MRN: [id number] Date of Birth: 11/12/1955 Referring Provider:     CARDIAC REHAB PHASE II ORIENTATION from 03/20/2017 in [facility] CARDIAC [facility]  Referring Provider  Croitoru, Rachelle Hora MD      Initial Encounter Date:    CARDIAC REHAB PHASE II ORIENTATION from 03/20/2017 in [facility] CARDIAC REHAB  Date  03/20/17  Referring Provider  Croitoru, Mihai MD      Visit Diagnosis: 11/08/16 Status post coronary artery stent placement to LAD  Patient's Home Medications on Admission:  Current Outpatient Prescriptions:  .  aspirin 81 MG chewable tablet, Chew 1 tablet (81 mg total) by mouth daily., Disp: , Rfl:  .  Biotin 5000 MCG TABS, Take 1 tablet by mouth daily., Disp: , Rfl:  .  carvedilol (COREG) 12.5 MG tablet, Take 1 tablet (12.5 mg total) by mouth 2 (two) times daily., Disp: 180 tablet, Rfl: 3 .  chlorhexidine (PERIDEX) 0.12 % solution, Use as directed 15 mLs in the mouth or throat 2 (two) times daily., Disp: , Rfl:  .  Cholecalciferol (VITAMIN D3) 2000 units CHEW, Chew 1 each by mouth daily., Disp: , Rfl:  .  clopidogrel (PLAVIX) 75 MG tablet, Take 1 tablet (75 mg total) by mouth daily with breakfast., Disp: 90 tablet, Rfl: 4 .  docusate sodium (COLACE) 100 MG capsule, Take 100 mg by mouth daily., Disp: , Rfl:  .  ferrous sulfate 325 (65 FE) MG tablet, Take 1 tablet (325 mg total) by mouth daily with breakfast., Disp: 90 tablet, Rfl: 3 .  linaclotide (LINZESS) 145 MCG CAPS capsule, Take 145 mcg by mouth daily before breakfast., Disp: , Rfl:  .  lisinopril-hydrochlorothiazide (PRINZIDE,ZESTORETIC) 20-12.5 MG tablet, Take 2 tablets by mouth daily., Disp: 180 tablet, Rfl: 3 .  nitroGLYCERIN (NITROSTAT) 0.4 MG SL tablet, Place 1 tablet (0.4 mg total) under the tongue every 5 (five) minutes as needed., Disp: 25 tablet, Rfl: 3 .  pantoprazole (PROTONIX) 40 MG tablet, Take 1 tablet (40  mg total) by mouth daily., Disp: 90 tablet, Rfl: 3 .  polyethylene glycol (MIRALAX / GLYCOLAX) packet, Take 17 g by mouth daily., Disp: , Rfl:  .  rosuvastatin (CRESTOR) 5 MG tablet, Take 1 tablet (5 mg total) by mouth daily., Disp: 30 tablet, Rfl: 11 .  valACYclovir (VALTREX) 500 MG tablet, Take 500 mg by mouth daily., Disp: , Rfl: 0  Past Medical History: Past Medical History:  Diagnosis Date  . Coronary artery disease    a. 10/2016: LHC with 95% mLAD s/p DES, mod non obst dz in dLAD and OM1  . GERD (gastroesophageal reflux disease)   . Hemorrhoids   . Hyperlipidemia    a. intolerant to multiple statins and Zetia  . Hypertension   . IBS (irritable bowel syndrome)   . Lower extremity edema   . Obesity   . Refusal of blood transfusions as patient is [employer]   . Type II diabetes mellitus (HCC)     Tobacco Use: History  Smoking Status  . Never Smoker  Smokeless Tobacco  . Never Used    Labs: Recent Review Flowsheet Data    Labs for ITP Cardiac and Pulmonary Rehab Latest Ref Rng & Units 07/05/2014 04/06/2015 10/23/2015 11/13/2015 02/27/2017   Cholestrol 100 - 199 mg/dL - - - 409(W) 119(J)   LDLCALC 0 - 99 mg/dL - - - 478(G) 956(O)   LDLDIRECT mg/dL - - - - -  HDL >39 mg/dL - - - 16(X) 55   Trlycerides 0 - 149 mg/dL - - - 92 84   Hemoglobin A1c 4.8 - 5.6 % 6.1 6.1 6.4 - 6.8(H)      Capillary Blood Glucose: Lab Results  Component Value Date   GLUCAP 82 03/26/2017   GLUCAP 114 (H) 11/09/2016   GLUCAP 127 (H) 11/08/2016   GLUCAP 82 11/08/2016   GLUCAP 131 (H) 11/08/2016     Exercise Target Goals:    Exercise Program Goal: Individual exercise prescription set with THRR, safety & activity barriers. Participant demonstrates ability to understand and report RPE using BORG scale, to self-measure pulse accurately, and to acknowledge the importance of the exercise prescription.  Exercise Prescription Goal: Starting with aerobic activity 30 plus minutes a day, 3 days  per week for initial exercise prescription. Provide home exercise prescription and guidelines that participant acknowledges understanding prior to discharge.  Activity Barriers & Risk Stratification:     Activity Barriers & Cardiac Risk Stratification - 03/20/17 0825      Activity Barriers & Cardiac Risk Stratification   Activity Barriers Back Problems;Shortness of Breath   Cardiac Risk Stratification High      6 Minute Walk:     6 Minute Walk    Row Name 03/20/17 1157         6 Minute Walk   Phase Initial     Distance 1627 feet     Walk Time 6 minutes     # of Rest Breaks 0     MPH 3.1     METS 3.8     RPE 11     VO2 Peak 13.4     Symptoms No     Resting HR 65 bpm     Resting BP 122/74     Max Ex. HR 100 bpm     Max Ex. BP 132/86     2 Minute Post BP 128/74        Oxygen Initial Assessment:   Oxygen Re-Evaluation:   Oxygen Discharge (Final Oxygen Re-Evaluation):   Initial Exercise Prescription:     Initial Exercise Prescription - 03/20/17 1200      Date of Initial Exercise RX and Referring Provider   Date 03/20/17   Referring Provider Croitoru, Mihai MD     Treadmill   MPH 2.8   Grade 1   Minutes 10   METs 3.53     Bike   Level 0.8   Minutes 10   METs 3     NuStep   Level 3   SPM 85   Minutes 10   METs 2.8     Prescription Details   Frequency (times per week) 3   Duration Progress to 45 minutes of aerobic exercise without signs/symptoms of physical distress     Intensity   THRR 40-80% of Max Heartrate 63-126   Ratings of Perceived Exertion 11-13   Perceived Dyspnea 0-4     Progression   Progression Continue to progress workloads to maintain intensity without signs/symptoms of physical distress.     Resistance Training   Training Prescription Yes   Weight 2   Reps 10-15      Perform Capillary Blood Glucose checks as needed.  Exercise Prescription Changes:     Exercise Prescription Changes    Row Name 03/24/17 1303  04/15/17 1300 04/28/17 1654 05/14/17 1600       Response to Exercise   Blood Pressure (Admit) 114/72 120/80 114/80 120/70  Blood Pressure (Exercise) 140/64 138/50 142/82 148/80    Blood Pressure (Exit) 116/74 120/70 124/78 114/78    Heart Rate (Admit) 93 bpm 80 bpm 74 bpm 75 bpm    Heart Rate (Exercise) 104 bpm 122 bpm 118 bpm 94 bpm    Heart Rate (Exit) 69 bpm 80 bpm 74 bpm 59 bpm    Rating of Perceived Exertion (Exercise) 12 12 11 11     Symptoms none none none none    Comments pt oriented to exercise equipment and exercise safely and without discomfort  -  -  -    Duration Continue with 30 min of aerobic exercise without signs/symptoms of physical distress. Continue with 30 min of aerobic exercise without signs/symptoms of physical distress. Continue with 30 min of aerobic exercise without signs/symptoms of physical distress. Continue with 30 min of aerobic exercise without signs/symptoms of physical distress.    Intensity THRR unchanged THRR unchanged THRR unchanged THRR unchanged      Progression   Progression Continue to progress workloads to maintain intensity without signs/symptoms of physical distress. Continue to progress workloads to maintain intensity without signs/symptoms of physical distress. Continue to progress workloads to maintain intensity without signs/symptoms of physical distress. Continue to progress workloads to maintain intensity without signs/symptoms of physical distress.    Average METs 3 3 3.2 2.9      Resistance Training   Training Prescription Yes Yes Yes Yes    Weight 2lbs 3lbs 2lbs 3lbs    Reps 10-15 10-15 10-15 10-15    Time 10 Minutes 10 Minutes 10 Minutes 10 Minutes      Treadmill   MPH 2.6 2.6 2.6  -    Grade 2 2 3   -    Minutes 10 15 15   -    METs 3.71 3.71 4.07  -      Bike   Level 0.5 0.5 0.5 1    Minutes 10 15 15 15     METs 2.24 2.24 2.23 3.5      NuStep   Level  -  -  - 3    SPM  -  -  - 80    Minutes  -  -  - 10    METs  -  -  -  2.2      Home Exercise Plan   Plans to continue exercise at  - Home (comment)  walking and stationary bike Home (comment)  walking and stationary bike Home (comment)  walking and stationary bike    Frequency  - Add 2 additional days to program exercise sessions. Add 2 additional days to program exercise sessions. Add 2 additional days to program exercise sessions.    Initial Home Exercises Provided  - 04/04/17 04/04/17 04/04/17       Exercise Comments:     Exercise Comments    Row Name 04/15/17 1305 05/14/17 1657         Exercise Comments Reviewed METs and goals. Pt is active at home and recently started HEP. Pt will continue to improve in cardiorespiratory fitness, Reviewed METs and goals. Pt is active at home and recently started HEP. Pt will continue to improve in cardiorespiratory fitness,         Exercise Goals and Review:     Exercise Goals    Row Name 03/20/17 0825 03/20/17 0830           Exercise Goals   Increase Physical Activity Yes  -  Intervention Provide advice, education, support and counseling about physical activity/exercise needs.;Develop an individualized exercise prescription for aerobic and resistive training based on initial evaluation findings, risk stratification, comorbidities and participant's personal goals.  -      Expected Outcomes Achievement of increased cardiorespiratory fitness and enhanced flexibility, muscular endurance and strength shown through measurements of functional capacity and personal statement of participant.  -      Increase Strength and Stamina Yes -  get back to dancing and be able to climb stairs without SOB. Learn activity/exercise limitations      Intervention Provide advice, education, support and counseling about physical activity/exercise needs.;Develop an individualized exercise prescription for aerobic and resistive training based on initial evaluation findings, risk stratification, comorbidities and participant's  personal goals.  -      Expected Outcomes Achievement of increased cardiorespiratory fitness and enhanced flexibility, muscular endurance and strength shown through measurements of functional capacity and personal statement of participant.  -         Exercise Goals Re-Evaluation :     Exercise Goals Re-Evaluation    Row Name 04/04/17 1531 05/14/17 1656           Exercise Goal Re-Evaluation   Exercise Goals Review Increase Physical Activity;Increase Strenth and Stamina  -      Comments Reviewed home exercise with pt today.  Pt plans to walk and ride stationary bike for exercise.  Reviewed THR, pulse, RPE, sign and symptoms, NTG use, and when to call 911 or MD.  Also discussed weather considerations and indoor options.  Pt voiced understanding. Pt is walking and riding stationary bike at home for exercise. Pt stated she is getting stronger and enjoys coming to cardiac rehab.      Expected Outcomes Pt will build on aerobic capacity Pt will build on aerobic capacity and continue to bond with other participants in the program.          Discharge Exercise Prescription (Final Exercise Prescription Changes):     Exercise Prescription Changes - 05/14/17 1600      Response to Exercise   Blood Pressure (Admit) 120/70   Blood Pressure (Exercise) 148/80   Blood Pressure (Exit) 114/78   Heart Rate (Admit) 75 bpm   Heart Rate (Exercise) 94 bpm   Heart Rate (Exit) 59 bpm   Rating of Perceived Exertion (Exercise) 11   Symptoms none   Duration Continue with 30 min of aerobic exercise without signs/symptoms of physical distress.   Intensity THRR unchanged     Progression   Progression Continue to progress workloads to maintain intensity without signs/symptoms of physical distress.   Average METs 2.9     Resistance Training   Training Prescription Yes   Weight 3lbs   Reps 10-15   Time 10 Minutes     Bike   Level 1   Minutes 15   METs 3.5     NuStep   Level 3   SPM 80   Minutes 10    METs 2.2     Home Exercise Plan   Plans to continue exercise at Home (comment)  walking and stationary bike   Frequency Add 2 additional days to program exercise sessions.   Initial Home Exercises Provided 04/04/17      Nutrition:  Target Goals: Understanding of nutrition guidelines, daily intake of sodium 1500mg , cholesterol 200mg , calories 30% from fat and 7% or less from saturated fats, daily to have 5 or more servings of fruits and vegetables.  Biometrics:  Pre Biometrics - 03/20/17 1636      Pre Biometrics   Height 5\' 5"  (1.651 m)   Weight 170 lb 3.1 oz (77.2 kg)   Waist Circumference 36.5 inches   Hip Circumference 40.5 inches   Waist to Hip Ratio 0.9 %   BMI (Calculated) 28.4   Triceps Skinfold 32 mm   % Body Fat 40 %   Grip Strength 34 kg   Flexibility 12 in   Single Leg Stand 30 seconds       Nutrition Therapy Plan and Nutrition Goals:     Nutrition Therapy & Goals - 03/20/17 1214      Nutrition Therapy   Diet Carb Modified, Therapeutic Lifestyle Changes     Personal Nutrition Goals   Nutrition Goal Wt loss of 1-2 lb/week to a wt loss goal wt of 6-10 lb at graduation from Cardiac Rehab. Pt's goal wt is 160 lb.    Personal Goal #2 Pt to identify and limit food sources of saturated fat, trans fat, and sodium.      Intervention Plan   Intervention Prescribe, educate and counsel regarding individualized specific dietary modifications aiming towards targeted core components such as weight, hypertension, lipid management, diabetes, heart failure and other comorbidities.;Nutrition handout(s) given to patient.  MEDFICTS results handout   Expected Outcomes Short Term Goal: Understand basic principles of dietary content, such as calories, fat, sodium, cholesterol and nutrients.;Long Term Goal: Adherence to prescribed nutrition plan.      Nutrition Discharge: Nutrition Scores:     Nutrition Assessments - 03/20/17 1216      MEDFICTS Scores   Pre Score  112      Nutrition Goals Re-Evaluation:   Nutrition Goals Re-Evaluation:   Nutrition Goals Discharge (Final Nutrition Goals Re-Evaluation):   Psychosocial: Target Goals: Acknowledge presence or absence of significant depression and/or stress, maximize coping skills, provide positive support system. Participant is able to verbalize types and ability to use techniques and skills needed for reducing stress and depression.  Initial Review & Psychosocial Screening:     Initial Psych Review & Screening - 03/20/17 1633      Initial Review   Current issues with None Identified     Family Dynamics   Good Support System? Yes     Barriers   Psychosocial barriers to participate in program There are no identifiable barriers or psychosocial needs.     Screening Interventions   Interventions Encouraged to exercise      Quality of Life Scores:     Quality of Life - 03/26/17 1411      Quality of Life Scores   Health/Function Pre 20.37 %  QOL scores reviewed.  overall scores mild low.  pt mostly concerned about her recent cardiac event and risk factors.      Socioeconomic Pre 20.94 %   Psych/Spiritual Pre 24 %   Family Pre 20.2 %   GLOBAL Pre 21.2 %  pt encouraged to participate in CR exercise, nutrition and lifestyle education opportunities to reduce risk factors.   pt offered emotional support and reassurance.        PHQ-9: Recent Review Flowsheet Data    Depression screen [facility] 2/9 04/14/2017 03/24/2017 10/23/2015 07/21/2015 05/12/2015   Decreased Interest 0 0 0 0 0   Down, Depressed, Hopeless 0 0 0 0 0   PHQ - 2 Score 0 0 0 0 0     Interpretation of Total Score  Total Score Depression Severity:  1-4 = Minimal depression, 5-9 =  Mild depression, 10-14 = Moderate depression, 15-19 = Moderately severe depression, 20-27 = Severe depression   Psychosocial Evaluation and Intervention:     Psychosocial Evaluation - 03/24/17 1638      Psychosocial Evaluation & Interventions    Interventions Encouraged to exercise with the program and follow exercise prescription   Comments no psychosocial needs identified, no interventions necessary    Expected Outcomes pt will exhibit positive outlook with good coping skills.    Continue Psychosocial Services  No Follow up required      Psychosocial Re-Evaluation:     Psychosocial Re-Evaluation    Row Name 04/15/17 1735 05/15/17 1033           Psychosocial Re-Evaluation   Current issues with None Identified None Identified      Comments no psychosocial needs identified, no interventions necessary  no psychosocial needs identified, no interventions necessary       Expected Outcomes pt will exhibit positive outlook wtih good coping skills pt will exhibit positive outlook wtih good coping skills      Interventions Encouraged to attend Cardiac Rehabilitation for the exercise;Stress management education;Relaxation education Encouraged to attend Cardiac Rehabilitation for the exercise;Stress management education;Relaxation education      Continue Psychosocial Services  No Follow up required No Follow up required         Psychosocial Discharge (Final Psychosocial Re-Evaluation):     Psychosocial Re-Evaluation - 05/15/17 1033      Psychosocial Re-Evaluation   Current issues with None Identified   Comments no psychosocial needs identified, no interventions necessary    Expected Outcomes pt will exhibit positive outlook wtih good coping skills   Interventions Encouraged to attend Cardiac Rehabilitation for the exercise;Stress management education;Relaxation education   Continue Psychosocial Services  No Follow up required      Vocational Rehabilitation: Provide vocational rehab assistance to qualifying candidates.   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 Participant will state understanding/return demonstration of topics presented.  Learning Barriers/Preferences:     Learning Barriers/Preferences - 03/20/17 1610      Learning Barriers/Preferences   Learning Preferences Video;Written Material;Verbal Instruction;Skilled Demonstration;Pictoral      Education Topics: Count Your Pulse:  -Group instruction provided by verbal instruction, demonstration, patient participation and written materials to support subject.  Instructors address importance of being able to find your pulse and how to count your pulse when at home without a heart monitor.  Patients get hands on experience counting their pulse with staff help and individually.   CARDIAC REHAB PHASE II EXERCISE from 05/14/2017 in [facility] CARDIAC REHAB  Date  04/04/17  Instruction Review Code  2- meets goals/outcomes      Heart Attack, Angina, and Risk Factor Modification:  -Group instruction provided by verbal instruction, video, and written materials to support subject.  Instructors address signs and symptoms of angina and heart attacks.    Also discuss risk factors for heart disease and how to make changes to improve heart health risk factors.   Functional Fitness:  -Group instruction provided by verbal instruction, demonstration, patient participation, and written materials to support subject.  Instructors address safety measures for doing things around the house.  Discuss how to get up and down off the floor, how to pick things up properly, how to safely get out of a chair without assistance, and balance training.   CARDIAC REHAB PHASE II EXERCISE from 05/14/2017 in [facility] CARDIAC REHAB  Date  04/18/17  Instruction Review Code  2- meets goals/outcomes       Meditation and Mindfulness:  -Group instruction provided by verbal instruction, patient participation, and written materials to support subject.  Instructor addresses importance of mindfulness and meditation practice to help reduce stress and improve awareness.  Instructor also leads participants through a meditation exercise.    CARDIAC REHAB PHASE II EXERCISE from 05/14/2017 in [facility] CARDIAC REHAB  Date  05/14/17  Instruction Review Code  2- meets goals/outcomes      Stretching for Flexibility and Mobility:  -Group instruction provided by verbal instruction, patient participation, and written materials to support subject.  Instructors lead participants through series of stretches that are designed to increase flexibility thus improving mobility.  These stretches are additional exercise for major muscle groups that are typically performed during regular warm up and cool down.   CARDIAC REHAB PHASE II EXERCISE from 05/14/2017 in [facility] CARDIAC REHAB  Date  04/25/17  Instruction Review Code  2- meets goals/outcomes      Hands Only CPR:  -Group verbal, video, and participation provides a basic overview of AHA guidelines for community CPR. Role-play of emergencies allow participants the opportunity to practice calling for help and chest compression technique with discussion of AED use.   Hypertension: -Group verbal and written instruction that provides a basic overview of hypertension including the most recent diagnostic guidelines, risk factor reduction with self-care instructions and medication management.   CARDIAC REHAB PHASE II EXERCISE from 05/14/2017 in [facility] CARDIAC REHAB  Date  05/09/17  Instruction Review Code  2- meets goals/outcomes       Nutrition I class: Heart Healthy Eating:  -Group instruction provided by PowerPoint slides, verbal discussion, and written materials to support subject matter. The  instructor gives an explanation and review of the Therapeutic Lifestyle Changes diet recommendations, which includes a discussion on lipid goals, dietary fat, sodium, fiber, plant stanol/sterol esters, sugar, and the components of a well-balanced, healthy diet.   CARDIAC REHAB PHASE II EXERCISE from 05/14/2017 in [facility] CARDIAC REHAB  Date  04/16/17  Educator  RD  Instruction Review Code  Not applicable      Nutrition II class: Lifestyle Skills:  -Group instruction provided by PowerPoint slides, verbal discussion, and written materials to support subject matter. The instructor gives an explanation and review of label reading, grocery shopping for heart health, heart healthy recipe modifications, and ways to make healthier choices when eating out.   CARDIAC REHAB PHASE II EXERCISE from 05/14/2017 in [facility] CARDIAC REHAB  Date  05/13/17  Educator  RD  Instruction Review Code  2- meets goals/outcomes      Diabetes Question & Answer:  -Group instruction provided by PowerPoint slides, verbal discussion, and written materials to support subject matter. The instructor gives an explanation and review of diabetes co-morbidities, pre- and post-prandial blood glucose goals, pre-exercise blood glucose goals, signs, symptoms, and treatment of hypoglycemia and hyperglycemia, and foot care basics.   CARDIAC REHAB PHASE II EXERCISE from 05/14/2017 in [facility] CARDIAC REHAB  Date  05/02/17  Educator  RD  Instruction Review Code  2- meets goals/outcomes      Diabetes Blitz:  -Group instruction provided by PowerPoint slides, verbal discussion, and written materials to support subject matter. The instructor gives an explanation and review of the physiology behind type 1 and type 2 diabetes, diabetes medications and rational behind using different medications, pre- and post-prandial blood glucose recommendations and Hemoglobin A1c goals, diabetes  diet, and exercise including blood glucose guidelines for exercising safely.    CARDIAC REHAB PHASE II EXERCISE from 05/14/2017 in [facility] CARDIAC REHAB  Date  04/16/17  Educator  RD  Instruction Review Code  Not applicable      Portion Distortion:  -Group instruction provided by PowerPoint slides, verbal discussion, written materials, and food models to support subject matter. The instructor gives an explanation of serving size versus portion size, changes in portions sizes over the last 20 years, and what consists of a serving from each food group.   Stress Management:  -Group instruction provided by verbal instruction, video, and written materials to support subject matter.  Instructors review role of stress in heart disease and how to cope with stress positively.     CARDIAC REHAB PHASE II EXERCISE from 05/14/2017 in [facility] CARDIAC REHAB  Date  03/26/17  Instruction Review Code  2- meets goals/outcomes      Exercising on Your Own:  -Group instruction provided by verbal instruction, power point, and written materials to support subject.  Instructors discuss benefits of exercise, components of exercise, frequency and intensity of exercise, and end points for exercise.  Also discuss use of nitroglycerin and activating EMS.  Review options of places to exercise outside of rehab.  Review guidelines for sex with heart disease.   CARDIAC REHAB PHASE II EXERCISE from 05/14/2017 in [facility] CARDIAC REHAB  Date  04/16/17  Educator  EP  Instruction Review Code  2- meets goals/outcomes      Cardiac Drugs I:  -Group instruction provided by verbal instruction and written materials to support subject.  Instructor reviews cardiac drug classes: antiplatelets, anticoagulants, beta blockers, and statins.  Instructor discusses reasons, side effects, and lifestyle considerations for each drug class.   CARDIAC REHAB PHASE II EXERCISE from  05/14/2017 in [facility] CARDIAC REHAB  Date  05/07/17  Instruction Review Code  2- meets goals/outcomes      Cardiac Drugs II:  -Group instruction provided by verbal instruction and written materials to support subject.  Instructor reviews cardiac drug classes: angiotensin converting enzyme inhibitors (ACE-I), angiotensin II receptor blockers (ARBs), nitrates, and calcium channel blockers.  Instructor discusses reasons, side effects, and lifestyle considerations for each drug class.   Anatomy and Physiology of the Circulatory System:  Group verbal and written instruction and models provide basic cardiac anatomy and physiology, with the coronary electrical and arterial systems. Review of: AMI, Angina, Valve disease, Heart Failure, Peripheral Artery Disease, Cardiac Arrhythmia, Pacemakers, and the ICD.   Other Education:  -Group or individual verbal, written, or video instructions that support the educational goals of the cardiac rehab program.   Knowledge Questionnaire Score:     Knowledge Questionnaire Score - 03/20/17 1153      Knowledge Questionnaire Score   Pre Score 22/24   DM 13/15      Core Components/Risk Factors/Patient Goals at Admission:     Personal Goals and Risk Factors at Admission - 03/20/17 1627      Core Components/Risk Factors/Patient  Goals on Admission    Weight Management Yes;Weight Loss   Intervention Weight Management: Develop a combined nutrition and exercise program designed to reach desired caloric intake, while maintaining appropriate intake of nutrient and fiber, sodium and fats, and appropriate energy expenditure required for the weight goal.;Weight Management: Provide education and appropriate resources to help participant work on and attain dietary goals.   Admit Weight 170 lb 3.1 oz (77.2 kg)   Goal Weight: Short Term 164 lb (74.4 kg)   Goal Weight: Long Term 160 lb (72.6 kg)   Expected Outcomes Short Term: Continue to assess and  modify interventions until short term weight is achieved;Long Term: Adherence to nutrition and physical activity/exercise program aimed toward attainment of established weight goal;Weight Loss: Understanding of general recommendations for a balanced deficit meal plan, which promotes 1-2 lb weight loss per week and includes a negative energy balance of [phone] kcal/d;Understanding recommendations for meals to include 15-35% energy as protein, 25-35% energy from fat, 35-60% energy from carbohydrates, less than 200mg  of dietary cholesterol, 20-35 gm of total fiber daily;Understanding of distribution of calorie intake throughout the day with the consumption of 4-5 meals/snacks   Hypertension Yes   Intervention Provide education on lifestyle modifcations including regular physical activity/exercise, weight management, moderate sodium restriction and increased consumption of fresh fruit, vegetables, and low fat dairy, alcohol moderation, and smoking cessation.;Monitor prescription use compliance.   Expected Outcomes Short Term: Continued assessment and intervention until BP is < 140/12mm HG in hypertensive participants. < 130/34mm HG in hypertensive participants with diabetes, heart failure or chronic kidney disease.;Long Term: Maintenance of blood pressure at goal levels.   Lipids Yes   Intervention Provide education and support for participant on nutrition & aerobic/resistive exercise along with prescribed medications to achieve LDL 70mg , HDL >40mg .   Expected Outcomes Short Term: Participant states understanding of desired cholesterol values and is compliant with medications prescribed. Participant is following exercise prescription and nutrition guidelines.;Long Term: Cholesterol controlled with medications as prescribed, with individualized exercise RX and with personalized nutrition plan. Value goals: LDL < 70mg , HDL > 40 mg.      Core Components/Risk Factors/Patient Goals Review:      Goals and Risk  Factor Review    Row Name 04/15/17 1729 05/15/17 1032           Core Components/Risk Factors/Patient Goals Review   Personal Goals Review Weight Management/Obesity;Hypertension;Lipids Weight Management/Obesity;Hypertension;Lipids      Review pt with multiple RF demonstrates eagerness to participate in CR activities. unfortunately pt does not tolerate statins due to muscle/joint aches pt with multiple RF demonstrates eagerness to participate in CR activities. unfortunately pt does not tolerate statins due to muscle/joint aches.  pt demonstrates interest in learning healthy behaviors to reduce risk factors.       Expected Outcomes pt will participate in CR exercise, nutrition and lifestyle modification education to decrease overall CAD RF pt will participate in CR exercise, nutrition and lifestyle modification education to decrease overall CAD RF         Core Components/Risk Factors/Patient Goals at Discharge (Final Review):      Goals and Risk Factor Review - 05/15/17 1032      Core Components/Risk Factors/Patient Goals Review   Personal Goals Review Weight Management/Obesity;Hypertension;Lipids   Review pt with multiple RF demonstrates eagerness to participate in CR activities. unfortunately pt does not tolerate statins due to muscle/joint aches.  pt demonstrates interest in learning healthy behaviors to reduce risk factors.    Expected Outcomes  pt will participate in CR exercise, nutrition and lifestyle modification education to decrease overall CAD RF      ITP Comments:     ITP Comments    Row Name 03/20/17 0824 04/15/17 1729 05/15/17 1032       ITP Comments Medical Director, Dr. Armanda Magicraci Turner Medical Director, Dr. Armanda Magicraci Turner Medical Director, Dr. Armanda Magicraci Turner        Comments: Pt is making expected progress toward personal goals after completing 19 essions. Recommend continued exercise and life style modification education including  stress management and relaxation techniques  to decrease cardiac risk profile.

## 2017-05-16 ENCOUNTER — Encounter (HOSPITAL_COMMUNITY)
Admission: RE | Admit: 2017-05-16 | Discharge: 2017-05-16 | Disposition: A | Discharge status: Home / Self Care | Payer: BLUE CROSS/BLUE SHIELD | Source: Ambulatory Visit | Attending: Cardiovascular Disease | Admitting: Cardiovascular Disease

## 2017-05-16 DIAGNOSIS — Z955 Presence of coronary angioplasty implant and graft: Secondary | ICD-10-CM

## 2017-05-19 ENCOUNTER — Encounter (HOSPITAL_COMMUNITY)
Admission: RE | Admit: 2017-05-19 | Discharge: 2017-05-19 | Disposition: A | Discharge status: Home / Self Care | Payer: BLUE CROSS/BLUE SHIELD | Source: Ambulatory Visit | Attending: Cardiovascular Disease | Admitting: Cardiovascular Disease

## 2017-05-19 DIAGNOSIS — Z955 Presence of coronary angioplasty implant and graft: Secondary | ICD-10-CM

## 2017-05-21 ENCOUNTER — Encounter (HOSPITAL_COMMUNITY): Payer: BLUE CROSS/BLUE SHIELD

## 2017-05-23 ENCOUNTER — Encounter (HOSPITAL_COMMUNITY)
Admission: RE | Admit: 2017-05-23 | Discharge: 2017-05-23 | Disposition: A | Discharge status: Home / Self Care | Payer: BLUE CROSS/BLUE SHIELD | Source: Ambulatory Visit | Attending: Cardiovascular Disease | Admitting: Cardiovascular Disease

## 2017-05-23 DIAGNOSIS — Z955 Presence of coronary angioplasty implant and graft: Secondary | ICD-10-CM | POA: Diagnosis not present

## 2017-05-26 ENCOUNTER — Encounter (HOSPITAL_COMMUNITY): Payer: BLUE CROSS/BLUE SHIELD

## 2017-05-26 ENCOUNTER — Telehealth (HOSPITAL_COMMUNITY): Payer: Self-pay | Admitting: Student in an Organized Health Care Education/Training Program

## 2017-05-28 ENCOUNTER — Encounter (HOSPITAL_COMMUNITY)
Admission: RE | Admit: 2017-05-28 | Discharge: 2017-05-28 | Disposition: A | Discharge status: Home / Self Care | Payer: BLUE CROSS/BLUE SHIELD | Source: Ambulatory Visit | Attending: Cardiovascular Disease | Admitting: Cardiovascular Disease

## 2017-05-28 ENCOUNTER — Encounter (HOSPITAL_COMMUNITY): Payer: BLUE CROSS/BLUE SHIELD

## 2017-05-28 DIAGNOSIS — Z955 Presence of coronary angioplasty implant and graft: Secondary | ICD-10-CM

## 2017-05-30 ENCOUNTER — Telehealth (HOSPITAL_COMMUNITY): Payer: Self-pay | Admitting: *Deleted

## 2017-05-30 ENCOUNTER — Encounter (HOSPITAL_COMMUNITY): Payer: BLUE CROSS/BLUE SHIELD

## 2017-05-30 ENCOUNTER — Encounter (HOSPITAL_COMMUNITY): Admission: RE | Admit: 2017-05-30 | Payer: BLUE CROSS/BLUE SHIELD | Source: Ambulatory Visit

## 2017-06-02 ENCOUNTER — Encounter (HOSPITAL_COMMUNITY): Payer: BLUE CROSS/BLUE SHIELD

## 2017-06-04 ENCOUNTER — Encounter (HOSPITAL_COMMUNITY): Payer: BLUE CROSS/BLUE SHIELD

## 2017-06-04 ENCOUNTER — Telehealth (HOSPITAL_COMMUNITY): Payer: Self-pay | Admitting: Student in an Organized Health Care Education/Training Program

## 2017-06-06 ENCOUNTER — Encounter (HOSPITAL_COMMUNITY)
Admission: RE | Admit: 2017-06-06 | Discharge: 2017-06-06 | Disposition: A | Discharge status: Home / Self Care | Payer: BLUE CROSS/BLUE SHIELD | Source: Ambulatory Visit | Attending: Cardiovascular Disease | Admitting: Cardiovascular Disease

## 2017-06-06 ENCOUNTER — Encounter (HOSPITAL_COMMUNITY): Payer: BLUE CROSS/BLUE SHIELD

## 2017-06-06 VITALS — Ht 65.0 in | Wt 167.8 lb

## 2017-06-06 DIAGNOSIS — Z955 Presence of coronary angioplasty implant and graft: Secondary | ICD-10-CM | POA: Insufficient documentation

## 2017-06-06 NOTE — Progress Notes (Signed)
Discharge Progress Report  Patient Details  Name: Stacy Horton MRN: [id number] Date of Birth: 08/30/1956 Referring Provider:     CARDIAC REHAB PHASE II ORIENTATION from 03/20/2017 in [facility]  Referring Provider  Croitoru, Mihai MD       Number of Visits: 25  Reason for Discharge:  Patient reached a stable level of exercise. Patient independent in their exercise. Early Exit:  Back to work and her work schedule will not coincide with exercise classes  Smoking History:  History  Smoking Status  . Never Smoker  Smokeless Tobacco  . 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  ADL [facility]:   Initial Exercise Prescription:     Initial Exercise Prescription - 03/20/17 1200      Date of Initial Exercise RX and Referring Provider   Date 03/20/17   Referring Provider Croitoru, Mihai MD     Treadmill   MPH 2.8   Grade 1   Minutes 10   METs 3.53     Bike   Level 0.8   Minutes 10   METs 3     NuStep   Level 3   SPM 85   Minutes 10   METs 2.8     Prescription Details   Frequency (times per week) 3   Duration Progress to 45 minutes of aerobic exercise without signs/symptoms of physical distress     Intensity   THRR 40-80% of Max Heartrate 63-126   Ratings of Perceived Exertion 11-13   Perceived Dyspnea 0-4     Progression   Progression Continue to progress workloads to maintain intensity without signs/symptoms of physical distress.     Resistance Training   Training Prescription Yes   Weight 2   Reps 10-15      Discharge Exercise Prescription (Final Exercise Prescription Changes):     Exercise Prescription Changes - 06/06/17 1600      Response to Exercise   Blood Pressure (Admit) 148/82   Blood Pressure (Exercise) 138/78   Blood Pressure (Exit) 118/70   Heart Rate (Admit) 83 bpm   Heart Rate (Exercise) 112 bpm   Heart Rate (Exit) 83 bpm   Rating of Perceived Exertion  (Exercise) 11   Symptoms none   Duration Continue with 30 min of aerobic exercise without signs/symptoms of physical distress.   Intensity THRR unchanged     Progression   Progression Continue to progress workloads to maintain intensity without signs/symptoms of physical distress.   Average METs 3.4     Resistance Training   Training Prescription Yes   Weight 3lbs   Reps 10-15   Time 10 Minutes     Treadmill   MPH 3   Grade 2   Minutes 10   METs 4.2     Bike   Level 1.2   Minutes 10   METs 3.46     NuStep   Level 4   SPM 90   Minutes 10   METs 2.7     Home Exercise Plan   Plans to continue exercise at Home (comment)  walking and stationary bike   Frequency Add 2 additional days to program exercise sessions.   Initial Home Exercises Provided 04/04/17      Functional Capacity:     6 Minute Walk    Row Name 03/20/17 1157 06/06/17 1545 06/06/17 1600     6 Minute Walk   Phase Initial Discharge  -  Distance 1627 feet 1675 feet  -   Distance % Change  - 2.95 %  -   Distance Feet Change  - 48 ft  -   Walk Time 6 minutes 6 minutes  -   # of Rest Breaks 0 0  -   MPH 3.1 3.17  -   METS 3.8 4.13  -   RPE 11 11  -   VO2 Peak 13.4  -  -   Symptoms No No  -   Resting HR 65 bpm 88 bpm  -   Resting BP 122/74 130/70  -   Max Ex. HR 100 bpm 118 bpm  -   Max Ex. BP 132/86 134/82  -   2 Minute Post BP 128/74  - 120/80      Psychological, QOL, Others - Outcomes: PHQ 2/9: Depression screen [facility] 2/9 06/06/2017 04/14/2017 03/24/2017 10/23/2015 07/21/2015  Decreased Interest 0 0 0 0 0  Down, Depressed, Hopeless 0 0 0 0 0  PHQ - 2 Score 0 0 0 0 0    Quality of Life:     Quality of Life - 06/06/17 1543      Quality of Life Scores   Health/Function Pre 20.37 %   Health/Function Post 29.6 %   Health/Function % Change 45.31 %   Socioeconomic Pre 20.94 %   Socioeconomic Post 23.88 %   Socioeconomic % Change  14.04 %   Psych/Spiritual Pre 24 %   Psych/Spiritual Post  28.29 %   Psych/Spiritual % Change 17.88 %   Family Pre 20.2 %   Family Post 26.1 %   Family % Change 29.21 %   GLOBAL Pre 21.2 %   GLOBAL Post 27.53 %   GLOBAL % Change 29.86 %      Personal Goals: Goals established at orientation with interventions provided to work toward goal.     Personal Goals and Risk Factors at Admission - 03/20/17 1627      Core Components/Risk Factors/Patient Goals on Admission    Weight Management Yes;Weight Loss   Intervention Weight Management: Develop a combined nutrition and exercise program designed to reach desired caloric intake, while maintaining appropriate intake of nutrient and fiber, sodium and fats, and appropriate energy expenditure required for the weight goal.;Weight Management: Provide education and appropriate resources to help participant work on and attain dietary goals.   Admit Weight 170 lb 3.1 oz (77.2 kg)   Goal Weight: Short Term 164 lb (74.4 kg)   Goal Weight: Long Term 160 lb (72.6 kg)   Expected Outcomes Short Term: Continue to assess and modify interventions until short term weight is achieved;Long Term: Adherence to nutrition and physical activity/exercise program aimed toward attainment of established weight goal;Weight Loss: Understanding of general recommendations for a balanced deficit meal plan, which promotes 1-2 lb weight loss per week and includes a negative energy balance of [phone] kcal/d;Understanding recommendations for meals to include 15-35% energy as protein, 25-35% energy from fat, 35-60% energy from carbohydrates, less than 258m of dietary cholesterol, 20-35 gm of total fiber daily;Understanding of distribution of calorie intake throughout the day with the consumption of 4-5 meals/snacks   Hypertension Yes   Intervention Provide education on lifestyle modifcations including regular physical activity/exercise, weight management, moderate sodium restriction and increased consumption of fresh fruit, vegetables, and low  fat dairy, alcohol moderation, and smoking cessation.;Monitor prescription use compliance.   Expected Outcomes Short Term: Continued assessment and intervention until BP is < 140/949mHG in hypertensive  participants. < 130/30m HG in hypertensive participants with diabetes, heart failure or chronic kidney disease.;Long Term: Maintenance of blood pressure at goal levels.   Lipids Yes   Intervention Provide education and support for participant on nutrition & aerobic/resistive exercise along with prescribed medications to achieve LDL <719m HDL >4056m  Expected Outcomes Short Term: Participant states understanding of desired cholesterol values and is compliant with medications prescribed. Participant is following exercise prescription and nutrition guidelines.;Long Term: Cholesterol controlled with medications as prescribed, with individualized exercise RX and with personalized nutrition plan. Value goals: LDL < 66m19mDL > 40 mg.       Personal Goals Discharge:     Goals and Risk Factor Review    Row Name 04/15/17 1729 05/15/17 1032 06/13/17 1216         Core Components/Risk Factors/Patient Goals Review   Personal Goals Review Weight Management/Obesity;Hypertension;Lipids Weight Management/Obesity;Hypertension;Lipids Weight Management/Obesity;Hypertension;Lipids     Review pt with multiple RF demonstrates eagerness to participate in CR activities. unfortunately pt does not tolerate statins due to muscle/joint aches pt with multiple RF demonstrates eagerness to participate in CR activities. unfortunately pt does not tolerate statins due to muscle/joint aches.  pt demonstrates interest in learning healthy behaviors to reduce risk factors.  pt completed 25 sessions.  Returning to work has made CR participation difficult. pt plans to continue exercising at local gym.       Expected Outcomes pt will participate in CR exercise, nutrition and lifestyle modification education to decrease overall CAD RF pt  will participate in CR exercise, nutrition and lifestyle modification education to decrease overall CAD RF pt will participate in exercise, nutrition and lifestyle modification education to decrease overall CAD RF        Exercise Goals and Review:     Exercise Goals    Row Name 03/20/17 0825 03/20/17 0830           Exercise Goals   Increase Physical Activity Yes  -      Intervention Provide advice, education, support and counseling about physical activity/exercise needs.;Develop an individualized exercise prescription for aerobic and resistive training based on initial evaluation findings, risk stratification, comorbidities and participant's personal goals.  -      Expected Outcomes Achievement of increased cardiorespiratory fitness and enhanced flexibility, muscular endurance and strength shown through measurements of functional capacity and personal statement of participant.  -      Increase Strength and Stamina Yes -  get back to dancing and be able to climb stairs without SOB. Learn activity/exercise limitations      Intervention Provide advice, education, support and counseling about physical activity/exercise needs.;Develop an individualized exercise prescription for aerobic and resistive training based on initial evaluation findings, risk stratification, comorbidities and participant's personal goals.  -      Expected Outcomes Achievement of increased cardiorespiratory fitness and enhanced flexibility, muscular endurance and strength shown through measurements of functional capacity and personal statement of participant.  -         Nutrition & Weight - Outcomes:     Pre Biometrics - 03/20/17 1636      Pre Biometrics   Height 5' 5"  (1.651 m)   Weight 170 lb 3.1 oz (77.2 kg)   Waist Circumference 36.5 inches   Hip Circumference 40.5 inches   Waist to Hip Ratio 0.9 %   BMI (Calculated) 28.4   Triceps Skinfold 32 mm   % Body Fat 40 %   Grip Strength 34 kg   Flexibility  12 in    Single Leg Stand 30 seconds         Post Biometrics - 06/06/17 1546       Post  Biometrics   Height 5' 5"  (1.651 m)   Weight 167 lb 12.3 oz (76.1 kg)   Waist Circumference 34 inches   Hip Circumference 40 inches   Waist to Hip Ratio 0.85 %   BMI (Calculated) 27.92   Triceps Skinfold 32 mm   % Body Fat 38.8 %   Grip Strength 34.5 kg   Flexibility 16 in   Single Leg Stand 30 seconds      Nutrition:     Nutrition Therapy & Goals - 03/20/17 1214      Nutrition Therapy   Diet Carb Modified, Therapeutic Lifestyle Changes     Personal Nutrition Goals   Nutrition Goal Wt loss of 1-2 lb/week to a wt loss goal wt of 6-10 lb at graduation from Cardiac Rehab. Pt's goal wt is 160 lb.    Personal Goal #2 Pt to identify and limit food sources of saturated fat, trans fat, and sodium.      Intervention Plan   Intervention Prescribe, educate and counsel regarding individualized specific dietary modifications aiming towards targeted core components such as weight, hypertension, lipid management, diabetes, heart failure and other comorbidities.;Nutrition handout(s) given to patient.  MEDFICTS results handout   Expected Outcomes Short Term Goal: Understand basic principles of dietary content, such as calories, fat, sodium, cholesterol and nutrients.;Long Term Goal: Adherence to prescribed nutrition plan.      Nutrition Discharge:     Nutrition Assessments - 03/20/17 1216      MEDFICTS Scores   Pre Score 112      Education Questionnaire Score:     Knowledge Questionnaire Score - 06/06/17 1223      Knowledge Questionnaire Score   Post Score 22/24      Goals reviewed with patient. Pt graduated from cardiac rehab program today with completion of 25 exercise sessions in Phase II. Pt maintained good attendance and progressed nicely during his participation in rehab as evidenced by increased MET level.   Medication list reconciled. Repeat  PHQ score-0 .  Pt has made significant  lifestyle changes and should be commended for her success. Pt with less anxiety regarding her "heart".  Pt feels participating in cardiac rehab gave her confidence.   Pt completed post quality of life survey. Pt scored the following:     Quality of Life - 06/06/17 1543      Quality of Life Scores   Health/Function Pre 20.37 %   Health/Function Post 29.6 %   Health/Function % Change 45.31 %   Socioeconomic Pre 20.94 %   Socioeconomic Post 23.88 %   Socioeconomic % Change  14.04 %   Psych/Spiritual Pre 24 %   Psych/Spiritual Post 28.29 %   Psych/Spiritual % Change 17.88 %   Family Pre 20.2 %   Family Post 26.1 %   Family % Change 29.21 %   GLOBAL Pre 21.2 %   GLOBAL Post 27.53 %   GLOBAL % Change 29.86 %     Pt feels she has achieved her goals during cardiac rehab.  Pt wanted to get back to dancing.  Pt observed in the gym dancing with other participants. Pt has shown increase in strength and stamina.  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  Pt admits it has been  an adjustment but she remains confident that it will get better over time. Pt has learned activity and exercise restrictions participating in education classes geared toward exercise such as exercising on your own, pulse counting and angina and risk factors. Pt plans to continue exercise at [employer] 4-5 days a week for 30-45 minutes.  Prior to participating in cardiac rehab, pt only did the treadmill.  Now that she has been exposed to different types of equipment she feels she can now comfortable add other equipment such as the stationary bike and stepper with confidence. It was a delight to see her transformation from the beginning to present.  Her smile will be missed in the cardiac rehab program. Maurice Small RN, BSN Cardiac and Pulmonary Rehab Nurse Navigator

## 2017-06-09 ENCOUNTER — Encounter (HOSPITAL_COMMUNITY): Payer: BLUE CROSS/BLUE SHIELD

## 2017-06-09 ENCOUNTER — Encounter (HOSPITAL_COMMUNITY): Admission: RE | Admit: 2017-06-09 | Payer: BLUE CROSS/BLUE SHIELD | Source: Ambulatory Visit

## 2017-06-11 ENCOUNTER — Encounter (HOSPITAL_COMMUNITY): Payer: BLUE CROSS/BLUE SHIELD

## 2017-06-13 ENCOUNTER — Encounter (HOSPITAL_COMMUNITY): Payer: BLUE CROSS/BLUE SHIELD

## 2017-06-13 NOTE — Progress Notes (Signed)
Discharge Progress Report  Patient Details  Name: Stacy Horton MRN: [id number] Date of Birth: [birth year] Referring Provider:     CARDIAC REHAB PHASE II ORIENTATION from 03/20/2017 in [facility] CARDIAC [facility]  Referring Provider  Croitoru, Mihai MD       Number of Visits: 25  Reason for Discharge:  Early Exit:  Back to work  Smoking History:  History  Smoking Status  . Never Smoker  Smokeless Tobacco  . 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  ADL UCSD:   Initial Exercise Prescription:     Initial Exercise Prescription - 03/20/17 1200      Date of Initial Exercise RX and Referring Provider   Date 03/20/17   Referring Provider Croitoru, Mihai MD     Treadmill   MPH 2.8   Grade 1   Minutes 10   METs 3.53     Bike   Level 0.8   Minutes 10   METs 3     NuStep   Level 3   SPM 85   Minutes 10   METs 2.8     Prescription Details   Frequency (times per week) 3   Duration Progress to 45 minutes of aerobic exercise without signs/symptoms of physical distress     Intensity   THRR 40-80% of Max Heartrate 63-126   Ratings of Perceived Exertion 11-13   Perceived Dyspnea 0-4     Progression   Progression Continue to progress workloads to maintain intensity without signs/symptoms of physical distress.     Resistance Training   Training Prescription Yes   Weight 2   Reps 10-15      Discharge Exercise Prescription (Final Exercise Prescription Changes):     Exercise Prescription Changes - 06/06/17 1600      Response to Exercise   Blood Pressure (Admit) 148/82   Blood Pressure (Exercise) 138/78   Blood Pressure (Exit) 118/70   Heart Rate (Admit) 83 bpm   Heart Rate (Exercise) 112 bpm   Heart Rate (Exit) 83 bpm   Rating of Perceived Exertion (Exercise) 11   Symptoms none   Duration Continue with 30 min of aerobic exercise without signs/symptoms of physical distress.   Intensity THRR  unchanged     Progression   Progression Continue to progress workloads to maintain intensity without signs/symptoms of physical distress.   Average METs 3.4     Resistance Training   Training Prescription Yes   Weight 3lbs   Reps 10-15   Time 10 Minutes     Treadmill   MPH 3   Grade 2   Minutes 10   METs 4.2     Bike   Level 1.2   Minutes 10   METs 3.46     NuStep   Level 4   SPM 90   Minutes 10   METs 2.7     Home Exercise Plan   Plans to continue exercise at Home (comment)  walking and stationary bike   Frequency Add 2 additional days to program exercise sessions.   Initial Home Exercises Provided 04/04/17      Functional Capacity:     6 Minute Walk    Row Name 03/20/17 1157 06/06/17 1545 06/06/17 1600     6 Minute Walk   Phase Initial Discharge  -   Distance 1627 feet 1675 feet  -   Distance % Change  - 2.95 %  -  Distance Feet Change  - 48 ft  -   Walk Time 6 minutes 6 minutes  -   # of Rest Breaks 0 0  -   MPH 3.1 3.17  -   METS 3.8 4.13  -   RPE 11 11  -   VO2 Peak 13.4  -  -   Symptoms No No  -   Resting HR 65 bpm 88 bpm  -   Resting BP 122/74 130/70  -   Max Ex. HR 100 bpm 118 bpm  -   Max Ex. BP 132/86 134/82  -   2 Minute Post BP 128/74  - 120/80      Psychological, QOL, Others - Outcomes: PHQ 2/9: Depression screen [facility] 2/9 06/06/2017 04/14/2017 03/24/2017 10/23/2015 07/21/2015  Decreased Interest 0 0 0 0 0  Down, Depressed, Hopeless 0 0 0 0 0  PHQ - 2 Score 0 0 0 0 0    Quality of Life:     Quality of Life - 06/06/17 1543      Quality of Life Scores   Health/Function Pre 20.37 %   Health/Function Post 29.6 %   Health/Function % Change 45.31 %   Socioeconomic Pre 20.94 %   Socioeconomic Post 23.88 %   Socioeconomic % Change  14.04 %   Psych/Spiritual Pre 24 %   Psych/Spiritual Post 28.29 %   Psych/Spiritual % Change 17.88 %   Family Pre 20.2 %   Family Post 26.1 %   Family % Change 29.21 %   GLOBAL Pre 21.2 %   GLOBAL  Post 27.53 %   GLOBAL % Change 29.86 %      Personal Goals: Goals established at orientation with interventions provided to work toward goal.     Personal Goals and Risk Factors at Admission - 03/20/17 1627      Core Components/Risk Factors/Patient Goals on Admission    Weight Management Yes;Weight Loss   Intervention Weight Management: Develop a combined nutrition and exercise program designed to reach desired caloric intake, while maintaining appropriate intake of nutrient and fiber, sodium and fats, and appropriate energy expenditure required for the weight goal.;Weight Management: Provide education and appropriate resources to help participant work on and attain dietary goals.   Admit Weight 170 lb 3.1 oz (77.2 kg)   Goal Weight: Short Term 164 lb (74.4 kg)   Goal Weight: Long Term 160 lb (72.6 kg)   Expected Outcomes Short Term: Continue to assess and modify interventions until short term weight is achieved;Long Term: Adherence to nutrition and physical activity/exercise program aimed toward attainment of established weight goal;Weight Loss: Understanding of general recommendations for a balanced deficit meal plan, which promotes 1-2 lb weight loss per week and includes a negative energy balance of [phone] kcal/d;Understanding recommendations for meals to include 15-35% energy as protein, 25-35% energy from fat, 35-60% energy from carbohydrates, less than  of dietary cholesterol, 20-35 gm of total fiber daily;Understanding of distribution of calorie intake throughout the day with the consumption of 4-5 meals/snacks   Hypertension Yes   Intervention Provide education on lifestyle modifcations including regular physical activity/exercise, weight management, moderate sodium restriction and increased consumption of fresh fruit, vegetables, and low fat dairy, alcohol moderation, and smoking cessation.;Monitor prescription use compliance.   Expected Outcomes Short Term: Continued assessment  and intervention until BP is < 140/66mm HG in hypertensive participants. < 130/4mm HG in hypertensive participants with diabetes, heart failure or chronic kidney disease.;Long Term: Maintenance of blood pressure  at goal levels.   Lipids Yes   Intervention Provide education and support for participant on nutrition & aerobic/resistive exercise along with prescribed medications to achieve LDL 70mg , HDL >40mg .   Expected Outcomes Short Term: Participant states understanding of desired cholesterol values and is compliant with medications prescribed. Participant is following exercise prescription and nutrition guidelines.;Long Term: Cholesterol controlled with medications as prescribed, with individualized exercise RX and with personalized nutrition plan. Value goals: LDL < , HDL > 40 mg.       Personal Goals Discharge:     Goals and Risk Factor Review    Row Name 04/15/17 1729 05/15/17 1032 06/13/17 1216         Core Components/Risk Factors/Patient Goals Review   Personal Goals Review Weight Management/Obesity;Hypertension;Lipids Weight Management/Obesity;Hypertension;Lipids Weight Management/Obesity;Hypertension;Lipids     Review pt with multiple RF demonstrates eagerness to participate in CR activities. unfortunately pt does not tolerate statins due to muscle/joint aches pt with multiple RF demonstrates eagerness to participate in CR activities. unfortunately pt does not tolerate statins due to muscle/joint aches.  pt demonstrates interest in learning healthy behaviors to reduce risk factors.  pt completed 25 sessions.  Returning to work has made CR participation difficult. pt plans to continue exercising at local gym.       Expected Outcomes pt will participate in CR exercise, nutrition and lifestyle modification education to decrease overall CAD RF pt will participate in CR exercise, nutrition and lifestyle modification education to decrease overall CAD RF pt will participate in exercise,  nutrition and lifestyle modification education to decrease overall CAD RF        Exercise Goals and Review:     Exercise Goals    Row Name 03/20/17 0825 03/20/17 0830           Exercise Goals   Increase Physical Activity Yes  -      Intervention Provide advice, education, support and counseling about physical activity/exercise needs.;Develop an individualized exercise prescription for aerobic and resistive training based on initial evaluation findings, risk stratification, comorbidities and participant's personal goals.  -      Expected Outcomes Achievement of increased cardiorespiratory fitness and enhanced flexibility, muscular endurance and strength shown through measurements of functional capacity and personal statement of participant.  -      Increase Strength and Stamina Yes -  get back to dancing and be able to climb stairs without SOB. Learn activity/exercise limitations      Intervention Provide advice, education, support and counseling about physical activity/exercise needs.;Develop an individualized exercise prescription for aerobic and resistive training based on initial evaluation findings, risk stratification, comorbidities and participant's personal goals.  -      Expected Outcomes Achievement of increased cardiorespiratory fitness and enhanced flexibility, muscular endurance and strength shown through measurements of functional capacity and personal statement of participant.  -         Nutrition & Weight - Outcomes:     Pre Biometrics - 03/20/17 1636      Pre Biometrics   Height  (1.651 m)   Weight 170 lb 3.1 oz (77.2 kg)   Waist Circumference 36.5 inches   Hip Circumference 40.5 inches   Waist to Hip Ratio 0.9 %   BMI (Calculated) 28.4   Triceps Skinfold 32 mm   % Body Fat 40 %   Grip Strength 34 kg   Flexibility 12 in   Single Leg Stand 30 seconds         Post Biometrics -  06/06/17 1546       Post  Biometrics   Height  (1.651 m)   Weight  167 lb 12.3 oz (76.1 kg)   Waist Circumference 34 inches   Hip Circumference 40 inches   Waist to Hip Ratio 0.85 %   BMI (Calculated) 27.92   Triceps Skinfold 32 mm   % Body Fat 38.8 %   Grip Strength 34.5 kg   Flexibility 16 in   Single Leg Stand 30 seconds      Nutrition:     Nutrition Therapy & Goals - 03/20/17 1214      Nutrition Therapy   Diet Carb Modified, Therapeutic Lifestyle Changes     Personal Nutrition Goals   Nutrition Goal Wt loss of 1-2 lb/week to a wt loss goal wt of 6-10 lb at graduation from Cardiac Rehab. Pt's goal wt is 160 lb.    Personal Goal #2 Pt to identify and limit food sources of saturated fat, trans fat, and sodium.      Intervention Plan   Intervention Prescribe, educate and counsel regarding individualized specific dietary modifications aiming towards targeted core components such as weight, hypertension, lipid management, diabetes, heart failure and other comorbidities.;Nutrition handout(s) given to patient.  MEDFICTS results handout   Expected Outcomes Short Term Goal: Understand basic principles of dietary content, such as calories, fat, sodium, cholesterol and nutrients.;Long Term Goal: Adherence to prescribed nutrition plan.      Nutrition Discharge:     Nutrition Assessments - 03/20/17 1216      MEDFICTS Scores   Pre Score 112      Education Questionnaire Score:     Knowledge Questionnaire Score - 06/06/17 1223      Knowledge Questionnaire Score   Post Score 22/24      Goals reviewed with patient; copy given to patient.

## 2017-06-16 ENCOUNTER — Encounter (HOSPITAL_COMMUNITY): Admission: RE | Admit: 2017-06-16 | Payer: BLUE CROSS/BLUE SHIELD | Source: Ambulatory Visit

## 2017-06-16 ENCOUNTER — Encounter (HOSPITAL_COMMUNITY): Payer: BLUE CROSS/BLUE SHIELD

## 2017-06-18 ENCOUNTER — Encounter (HOSPITAL_COMMUNITY): Payer: BLUE CROSS/BLUE SHIELD

## 2017-06-20 ENCOUNTER — Encounter (HOSPITAL_COMMUNITY): Payer: BLUE CROSS/BLUE SHIELD

## 2017-06-23 ENCOUNTER — Encounter (HOSPITAL_COMMUNITY): Payer: BLUE CROSS/BLUE SHIELD

## 2017-06-25 ENCOUNTER — Encounter (HOSPITAL_COMMUNITY): Payer: BLUE CROSS/BLUE SHIELD

## 2017-06-27 ENCOUNTER — Encounter (HOSPITAL_COMMUNITY): Payer: BLUE CROSS/BLUE SHIELD

## 2017-06-30 ENCOUNTER — Encounter (HOSPITAL_COMMUNITY): Payer: BLUE CROSS/BLUE SHIELD

## 2017-07-02 ENCOUNTER — Encounter (HOSPITAL_COMMUNITY): Payer: BLUE CROSS/BLUE SHIELD

## 2017-07-04 ENCOUNTER — Encounter (HOSPITAL_COMMUNITY): Payer: BLUE CROSS/BLUE SHIELD

## 2017-08-23 ENCOUNTER — Other Ambulatory Visit: Payer: Self-pay | Admitting: Student

## 2017-08-25 NOTE — Telephone Encounter (Signed)
REFILL 

## 2017-09-12 ENCOUNTER — Other Ambulatory Visit: Payer: Self-pay | Admitting: Student

## 2017-09-26 ENCOUNTER — Other Ambulatory Visit: Payer: Self-pay | Admitting: Family Medicine

## 2017-09-26 DIAGNOSIS — Z1231 Encounter for screening mammogram for malignant neoplasm of breast: Secondary | ICD-10-CM

## 2017-10-14 ENCOUNTER — Telehealth: Payer: Self-pay | Admitting: Cardiovascular Disease

## 2017-10-14 NOTE — Telephone Encounter (Signed)
Pt wants to know when does she need her next appointment with Dr C?

## 2017-10-14 NOTE — Telephone Encounter (Signed)
Appointment has been made for 5/20 with Dr. Royann Shiversroitoru, per the patient request.  The patient would like to know if she can get any assistance with any of her cardiac medications. Message will be routed Dr. Erin Hearingroitoru's assistant for her recommendation.

## 2017-10-21 ENCOUNTER — Ambulatory Visit
Admission: RE | Admit: 2017-10-21 | Discharge: 2017-10-21 | Disposition: A | Discharge status: Home / Self Care | Payer: BLUE CROSS/BLUE SHIELD | Source: Ambulatory Visit | Attending: Family Medicine | Admitting: Family Medicine

## 2017-10-21 DIAGNOSIS — Z1231 Encounter for screening mammogram for malignant neoplasm of breast: Secondary | ICD-10-CM

## 2017-10-30 ENCOUNTER — Other Ambulatory Visit: Payer: Self-pay | Admitting: Physician Assistant

## 2017-12-09 ENCOUNTER — Telehealth: Payer: Self-pay

## 2017-12-09 NOTE — Telephone Encounter (Signed)
Pt left message on nurse line requesting a call back. Attempted to return call, left voicemail to call back. Shawna OrleansMeredith B Thomsen, RN

## 2017-12-28 ENCOUNTER — Emergency Department (HOSPITAL_COMMUNITY)
Admission: EM | Admit: 2017-12-28 | Discharge: 2017-12-28 | Disposition: A | Discharge status: Home / Self Care | Payer: BLUE CROSS/BLUE SHIELD | Attending: Emergency Medicine | Admitting: Emergency Medicine

## 2017-12-28 ENCOUNTER — Encounter (HOSPITAL_COMMUNITY): Payer: Self-pay | Admitting: Emergency Medicine

## 2017-12-28 DIAGNOSIS — E119 Type 2 diabetes mellitus without complications: Secondary | ICD-10-CM | POA: Insufficient documentation

## 2017-12-28 DIAGNOSIS — Z955 Presence of coronary angioplasty implant and graft: Secondary | ICD-10-CM | POA: Insufficient documentation

## 2017-12-28 DIAGNOSIS — R064 Hyperventilation: Secondary | ICD-10-CM | POA: Diagnosis present

## 2017-12-28 DIAGNOSIS — F4321 Adjustment disorder with depressed mood: Secondary | ICD-10-CM

## 2017-12-28 DIAGNOSIS — Z79899 Other long term (current) drug therapy: Secondary | ICD-10-CM | POA: Insufficient documentation

## 2017-12-28 DIAGNOSIS — I10 Essential (primary) hypertension: Secondary | ICD-10-CM | POA: Diagnosis not present

## 2017-12-28 DIAGNOSIS — I2510 Atherosclerotic heart disease of native coronary artery without angina pectoris: Secondary | ICD-10-CM | POA: Insufficient documentation

## 2017-12-28 DIAGNOSIS — F4320 Adjustment disorder, unspecified: Secondary | ICD-10-CM | POA: Insufficient documentation

## 2017-12-28 DIAGNOSIS — Z7902 Long term (current) use of antithrombotics/antiplatelets: Secondary | ICD-10-CM | POA: Insufficient documentation

## 2017-12-28 DIAGNOSIS — Z7982 Long term (current) use of aspirin: Secondary | ICD-10-CM | POA: Insufficient documentation

## 2017-12-28 LAB — CBG MONITORING, ED: GLUCOSE-CAPILLARY: 134 mg/dL — AB (ref 65–99)

## 2017-12-28 MED ORDER — ACETAMINOPHEN 325 MG PO TABS
650.0000 mg | ORAL_TABLET | Freq: Once | ORAL | Status: AC
  Administered 2017-12-28: 650 mg via ORAL
  Filled 2017-12-28: qty 2

## 2017-12-28 NOTE — ED Provider Notes (Signed)
[facility] EMERGENCY DEPARTMENT Provider Note   CSN: [id number] Arrival date & time: 12/28/17  1514     History   Chief Complaint Chief Complaint  Patient presents with  . Panic Attack    HPI Stacy Horton is a 62 y.o. female.  HPI   Stacy Horton is a 62 y.o. female, with a history of CAD, GERD, HTN, and DM, presenting to the ED with hyperventilation.  Patient states her younger sister died today. She was with her when that occurred. Family members state patient was crying and yelling.  States she would not answer their questions.  They are also concerned about the patient's blood pressure, however, she has not yet taken her evening blood pressure medication. She complains of a mild, generalized, nonradiating headache. Denies SI/HI. She does have family members who can stay with her.   Denies chest pain, shortness of breath, N/V/D, recent illness, falls/trauma, vision loss, numbness, weakness, or any other complaints.  Past Medical History:  Diagnosis Date  . Coronary artery disease    a. 10/2016: LHC with 95% mLAD s/p DES, mod non obst dz in dLAD and OM1  . GERD (gastroesophageal reflux disease)   . Hemorrhoids   . Hyperlipidemia    a. intolerant to multiple statins and Zetia  . Hypertension   . IBS (irritable bowel syndrome)   . Lower extremity edema   . Obesity   . Refusal of blood transfusions as patient is Jehovah's Witness   . Type II diabetes mellitus [facility])     Patient Active Problem List   Diagnosis Date Noted  . Coronary artery disease   . Chronic venous insufficiency 07/08/2013  . Hemorrhoids 11/09/2010  . Reflux esophagitis 05/21/2009  . CARPAL TUNNEL SYNDROME, BILATERAL 12/30/2008  . Diabetes mellitus type 2, diet-controlled (HCC) 01/27/2007  . Hypercholesterolemia 01/27/2007  . Essential hypertension 01/27/2007    Past Surgical History:  Procedure Laterality Date  . ABDOMINAL HYSTERECTOMY  1990s  . BREAST EXCISIONAL  BIOPSY Left 2008  . CARPAL TUNNEL RELEASE Bilateral 2011-2012  . CORONARY ANGIOPLASTY WITH STENT PLACEMENT  11/08/2016  . CORONARY STENT INTERVENTION N/A 11/08/2016   Procedure: Coronary Stent Intervention;  Surgeon: Yvonne Kendall, MD;  Location: [facility] INVASIVE CV LAB;  Service: Cardiovascular;  Laterality: N/A;  . LEFT HEART CATH AND CORONARY ANGIOGRAPHY N/A 11/08/2016   Procedure: Left Heart Cath and Coronary Angiography;  Surgeon: Yvonne Kendall, MD;  Location: [facility] INVASIVE CV LAB;  Service: Cardiovascular;  Laterality: N/A;  . NM MYOVIEW LTD  03/15/2008   no ischemia  . US ECHOCARDIOGRAPHY  09/28/2010   normal     OB History   None      Home Medications    Prior to Admission medications   Medication Sig Start Date End Date Taking? Authorizing Provider  aspirin 81 MG chewable tablet Chew 1 tablet (81 mg total) by mouth daily. 11/09/16   Janetta Hora, PA-C  Biotin 5000 MCG TABS Take 1 tablet by mouth daily.    [provider]  carvedilol (COREG) 12.5 MG tablet TAKE 1 TABLET (12.5 MG TOTAL) BY MOUTH 2 (TWO) TIMES DAILY. 09/12/17   Strader, Lennart Pall, PA-C  chlorhexidine (PERIDEX) 0.12 % solution Use as directed 15 mLs in the mouth or throat 2 (two) times daily.    [provider]  Cholecalciferol (VITAMIN D3) 2000 units CHEW Chew 1 each by mouth daily.    [provider]  clopidogrel (PLAVIX) 75 MG tablet Take 1  tablet (75 mg total) by mouth daily with breakfast. 11/09/16   Janetta Horahompson, Kathryn R, PA-C  docusate sodium (COLACE) 100 MG capsule Take 100 mg by mouth daily.    [provider]  ferrous sulfate 325 (65 FE) MG tablet Take 1 tablet (325 mg total) by mouth daily with breakfast. 04/18/17   Howard PouchFeng, Lauren, MD  linaclotide [facility](LINZESS) 145 MCG CAPS capsule Take 145 mcg by mouth daily before breakfast.    [provider]  lisinopril-hydrochlorothiazide (PRINZIDE,ZESTORETIC) 20-12.5 MG tablet 12/23. TAKE 2 TABLETS BY MOUTH DAILY. 08/25/17    Strader, Lennart PallBrittany M, PA-C  nitroGLYCERIN (NITROSTAT) 0.4 MG SL tablet Place 1 tablet (0.4 mg total) under the tongue every 5 (five) minutes as needed. 09/18/16   Strader, Lennart PallBrittany M, PA-C  pantoprazole (PROTONIX) 40 MG tablet TAKE 1 TABLET (40 MG TOTAL) BY MOUTH DAILY. 10/30/17   Croitoru, Mihai, MD  polyethylene glycol (MIRALAX / GLYCOLAX) packet Take 17 g by mouth daily.    [provider]  rosuvastatin (CRESTOR) 5 MG tablet Take 1 tablet (5 mg total) by mouth daily. 03/03/17   Croitoru, Mihai, MD  valACYclovir (VALTREX) 500 MG tablet Take 500 mg by mouth daily. 08/15/16   [provider]    Family History Family History  Problem Relation Age of Onset  . CAD Father   . Breast cancer Other     Social History Social History   Tobacco Use  . Smoking status: Never Smoker  . Smokeless tobacco: Never Used  Substance Use Topics  . Alcohol use: Yes    Alcohol/week: 1.2 oz    Types: 2 Shots of liquor per week  . Drug use: No     Allergies   Ezetimibe and Statins   Review of Systems Review of Systems  Constitutional: Negative for fever.  Respiratory: Negative for shortness of breath.   Cardiovascular: Negative for chest pain.  Gastrointestinal: Negative for abdominal pain, diarrhea, nausea and vomiting.  Musculoskeletal: Negative for neck pain.  Neurological: Positive for headaches. Negative for dizziness, syncope, weakness, light-headedness and numbness.  All other systems reviewed and are negative.    Physical Exam Updated Vital Signs BP (!) 182/114 (BP Location: Right Arm)   Pulse (!) 120   Temp 98 F (36.7 C) (Oral)   Resp (!) 24   SpO2 100%   Physical Exam  Constitutional: She is oriented to person, place, and time. She appears well-developed and well-nourished. No distress.  HENT:  Head: Normocephalic and atraumatic.  Eyes: Pupils are equal, round, and reactive to light. Conjunctivae and EOM are normal.  Neck: Neck supple.  Cardiovascular:  Normal rate, regular rhythm, normal heart sounds and intact distal pulses.  Pulmonary/Chest: Effort normal and breath sounds normal. No respiratory distress.  Patient shows no increased work of breathing.  No tachypnea.  Speaks in full sentences without difficulty.  Abdominal: Soft. There is no tenderness. There is no guarding.  Musculoskeletal: She exhibits no edema.  Lymphadenopathy:    She has no cervical adenopathy.  Neurological: She is alert and oriented to person, place, and time.  No sensory deficits.  No noted speech deficits. No aphasia. Patient handles oral secretions without difficulty. No noted swallowing defects.  Equal grip strength bilaterally. Strength 5/5 in the upper extremities. Strength 5/5 with flexion and extension of the hips, knees, and ankles bilaterally.  Negative Romberg. No gait disturbance.  Coordination intact including heel to shin and finger to nose.  Cranial nerves III-XII grossly intact.  No facial droop.  Skin: Skin is warm and dry. She is not diaphoretic.  Psychiatric: She has a normal mood and affect. Her behavior is normal.  Nursing note and vitals reviewed.    ED Treatments / Results  Labs (all labs ordered are listed, but only abnormal results are displayed) Labs Reviewed  CBG MONITORING, ED - Abnormal; Notable for the following components:      Result Value   Glucose-Capillary 134 (*)    All other components within normal limits    EKG None  Radiology No results found.  Procedures Procedures (including critical care time)  Medications Ordered in ED Medications  acetaminophen (TYLENOL) tablet 650 mg (650 mg Oral Given 12/28/17 1734)     Initial Impression / Assessment and Plan / ED Course  I have reviewed the triage vital signs and the nursing notes.  Pertinent labs & imaging results that were available during my care of the patient were reviewed by me and considered in my medical decision making (see chart for  details).  Clinical Course as of Dec 28 2356  Wynelle Link Dec 28, 2017  1810 Patient is walking around speaking with family members in the hallway. She states her headache has resolved. She continues to deny CP, SOB, dizziness, neuro abnormalities, or any other complaints.    [SJ]    Clinical Course User Index [SJ] Coston Mandato C, PA-C    Patient presents with hyperventilation and difficulty being calmed following the death of a close family member today.  Suspect grief reaction.  Upon my interview, patient is breathing normally and in no apparent distress.  She is able to answer my questions calmly and directly.  No noted neuro deficits.  Her blood pressure is elevated, however, my suspicion for hypertensive emergency is low.  Shared decision making was used regarding further testing.  Patient declined any further.  Headache resolved with minimal intervention.  She states she will take her evening dose of blood pressure medication when she gets home.  She will follow-up with her PCP. The patient was given instructions for home care as well as return precautions. Patient voices understanding of these instructions, accepts the plan, and is comfortable with discharge.   Findings and plan of care discussed with Rolland Porter, MD. Dr. Fayrene Fearing personally evaluated and examined this patient.  Vitals:   12/28/17 1553 12/28/17 1653 12/28/17 1734  BP: (!) 182/114  (!) 175/97  Pulse: (!) 120  98  Resp: (!) 24  18  Temp: 98 F (36.7 C)    TempSrc: Oral    SpO2: 100% 100% 100%    Final Clinical Impressions(s) / ED Diagnoses   Final diagnoses:  Grief reaction    ED Discharge Orders    None       Concepcion Living 12/29/17 0000    Rolland Porter, MD 12/30/17 0004

## 2017-12-28 NOTE — ED Triage Notes (Signed)
PT 's Family member passed today and Pt is crying and unable to maintain  A calm  Emotional  State . Pt is crying  With multiple Famile members holding on to her hands and hugging her.

## 2017-12-28 NOTE — Discharge Instructions (Signed)
Please continue to stay well hydrated. Try to get some rest and surround yourself with loved ones.  Please see your primary care provider as soon as possible to have your blood pressure rechecked.

## 2017-12-28 NOTE — ED Notes (Signed)
Declined W/C at D/C and was escorted to lobby by RN. 

## 2017-12-28 NOTE — ED Triage Notes (Signed)
Patient here with family, received news that her baby sister passed away and patient having panic attack - sobbing in triage.

## 2018-01-12 NOTE — Progress Notes (Signed)
[facility] Phone: [phone]   Date of Visit: 01/13/2018   HPI:  Left Knee Pain: - reports of intermittent pain of knees for years. Left knee pain has worsened in the past 2-3 days.  - no injury  - no swelling, no muscle weakness, no swelling.  - pain is worse with walking especially upstairs. No falls  - has not tried anything for pain  - reports she had a knee injection in the past (years ago) which did help   Grief:  - she was seen in the ED at the end of March. Her sister suddenly passed away that day - she reports she has been doing okay with managing her stress since then but is not back to her normal. She rates her grief at 45 initially and about 8 or 9 now.  - she was very close to her sister; they lived together - reports that she has a lot of support   Diet Controlled DM2:  - does not check cbgs at home - does watch her carbohydrate intake - has not been able to exercise since her sister passed.  - last A1c 6.8 in 01/2017   HTN: - medications: coreg 12.5mg  BID, Prinzide 20-12.5mg  daily - has not been monitoring salt intake recently as her family and friends have been making food for her - no chest pain, shortness of breath, HA, blurred vision. Feels like her ankles are swollen.   ROS: See HPI.  PMFSH:  PMH: HTN CAD DM2 HLD Reflux Esophagitis  Chronic Venous Insufficiency  Hemorrhoids Carpal Tunnel  PHYSICAL EXAM: BP 138/82   Pulse 73   Temp 98 F (36.7 C) (Oral)   Wt 169 lb (76.7 kg)   SpO2 97%   BMI 28.12 kg/m  GEN: NAD CV: RRR, no murmurs, rubs, or gallops PULM: CTAB, normal effort SKIN: No rash or cyanosis; warm and well-perfused MSK: Right Knee:  Normal to inspection with no erythema or effusion or obvious bony abnormalities. Palpation: no warmth. mild lateral joint line tenderness and patellar tenderness  ROM normal in flexion and extension and lower leg rotation. Ligaments with solid consistent endpoints  including ACL, PCL, LCL, MCL. Non painful patellar compression. Patellar and quadriceps tendons unremarkable. Hamstring and quadriceps strength is normal. EXTR: No lower extremity edema or calf tenderness PSYCH: Mood and affect euthymic, normal rate and volume of speech NEURO: Awake, alert, no focal deficits grossly, normal speech  Diabetic Foot Exam - Simple   Simple Foot Form Diabetic Foot exam was performed with the following findings:  Yes 01/13/2018  5:14 PM  Visual Inspection No deformities, no ulcerations, no other skin breakdown bilaterally:  Yes Sensation Testing Intact to touch and monofilament testing bilaterally:  Yes Pulse Check Posterior Tibialis and Dorsalis pulse intact bilaterally:  Yes Comments     ASSESSMENT/PLAN: Primary osteoarthritis of right knee Right knee pain likely due to OA. Will do trial of Naproxen 500mg  BID with meals x 3 days then PR as well as knee exercises. May benefit from knee injection in the future   Diabetes mellitus type 2, diet-controlled (HCC) A1c improved and controlled. Continue diet control.  - HgB A1c - Basic Metabolic Panel  Bereavement Reaction:  Interested in seeing behavioral health. Warm handoff completed.   Other iron deficiency anemia: history of iron deficiency anemia. Last checked in 03/2017. Currently taking iron  - CBC - Iron and TIBC - Ferritin  Elevated cholesterol Repeat lipid panel - Lipid panel  Encounter for  hepatitis C screening test for low risk patient - Hepatitis C antibody  Screening for HIV (human immunodeficiency virus) - HIV antibody  Palma HolterKanishka G Gunadasa, MD PGY 3 [facility]

## 2018-01-13 ENCOUNTER — Encounter: Payer: Self-pay | Admitting: Internal Medicine

## 2018-01-13 ENCOUNTER — Encounter: Payer: Self-pay | Admitting: Psychology

## 2018-01-13 ENCOUNTER — Ambulatory Visit: Payer: BLUE CROSS/BLUE SHIELD | Admitting: Internal Medicine

## 2018-01-13 ENCOUNTER — Other Ambulatory Visit: Payer: Self-pay

## 2018-01-13 VITALS — BP 138/82 | HR 73 | Temp 98.0°F | Wt 169.0 lb

## 2018-01-13 DIAGNOSIS — F4320 Adjustment disorder, unspecified: Secondary | ICD-10-CM

## 2018-01-13 DIAGNOSIS — Z634 Disappearance and death of family member: Secondary | ICD-10-CM

## 2018-01-13 DIAGNOSIS — E7800 Pure hypercholesterolemia, unspecified: Secondary | ICD-10-CM

## 2018-01-13 DIAGNOSIS — M1711 Unilateral primary osteoarthritis, right knee: Secondary | ICD-10-CM

## 2018-01-13 DIAGNOSIS — E119 Type 2 diabetes mellitus without complications: Secondary | ICD-10-CM | POA: Diagnosis not present

## 2018-01-13 DIAGNOSIS — D508 Other iron deficiency anemias: Secondary | ICD-10-CM

## 2018-01-13 DIAGNOSIS — Z114 Encounter for screening for human immunodeficiency virus [HIV]: Secondary | ICD-10-CM | POA: Diagnosis not present

## 2018-01-13 DIAGNOSIS — Z1159 Encounter for screening for other viral diseases: Secondary | ICD-10-CM

## 2018-01-13 DIAGNOSIS — F4321 Adjustment disorder with depressed mood: Secondary | ICD-10-CM | POA: Insufficient documentation

## 2018-01-13 LAB — POCT GLYCOSYLATED HEMOGLOBIN (HGB A1C): Hemoglobin A1C: 6.1

## 2018-01-13 MED ORDER — LISINOPRIL-HYDROCHLOROTHIAZIDE 20-12.5 MG PO TABS: ORAL_TABLET | ORAL | 0 refills | Status: DC

## 2018-01-13 MED ORDER — NAPROXEN 500 MG PO TABS: 500.0000 mg | ORAL_TABLET | Freq: Two times a day (BID) | ORAL | 0 refills | Status: AC

## 2018-01-13 NOTE — Assessment & Plan Note (Signed)
Patient's affect is within normal limits.  Her thinking is clear and goal directed.  She is replaying the events in her mind asking "What if this...then would my sister still be alive" thinking that if they had found her sooner, something might have been done.  She finds this line of thinking exhausting.  She seems to be experiencing a very normal grief reaction.  We discussed potentially helpful and unhelpful strategies.  She is interested in grief counseling.  Provided resources to Hospice.  Discussed sleep meditations and journaling prior to bed as ways to hopefully get her sleep back on track.  This is new since her sister's death.  If it persists, I encouraged her to schedule with her physician to discuss.  I don't want her to develop bad sleep habits that will be hard to break.  Provided her my business card.  I will call in a little while to see if she got connected or needs anything else.

## 2018-01-13 NOTE — Patient Instructions (Addendum)
Please try Naproxen 1 tablet twice a day with meal for 3 days then use as needed.   Knee Exercises Ask your health care provider which exercises are safe for you. Do exercises exactly as told by your health care provider and adjust them as directed. It is normal to feel mild stretching, pulling, tightness, or discomfort as you do these exercises, but you should stop right away if you feel sudden pain or your pain gets worse.Do not begin these exercises until told by your health care provider. STRETCHING AND RANGE OF MOTION EXERCISES These exercises warm up your muscles and joints and improve the movement and flexibility of your knee. These exercises also help to relieve pain, numbness, and tingling. Exercise A: Knee Extension, Prone 1. Lie on your abdomen on a bed. 2. Place your left / right knee just beyond the edge of the surface so your knee is not on the bed. You can put a towel under your left / right thigh just above your knee for comfort. 3. Relax your leg muscles and allow gravity to straighten your knee. You should feel a stretch behind your left / right knee. 4. Hold this position for __________ seconds. 5. Scoot up so your knee is supported between repetitions. Repeat __________ times. Complete this stretch __________ times a day. Exercise B: Knee Flexion, Active  1. Lie on your back with both knees straight. If this causes back discomfort, bend your left / right knee so your foot is flat on the floor. 2. Slowly slide your left / right heel back toward your buttocks until you feel a gentle stretch in the front of your knee or thigh. 3. Hold this position for __________ seconds. 4. Slowly slide your left / right heel back to the starting position. Repeat __________ times. Complete this exercise __________ times a day. Exercise C: Quadriceps, Prone  1. Lie on your abdomen on a firm surface, such as a bed or padded floor. 2. Bend your left / right knee and hold your ankle. If you  cannot reach your ankle or pant leg, loop a belt around your foot and grab the belt instead. 3. Gently pull your heel toward your buttocks. Your knee should not slide out to the side. You should feel a stretch in the front of your thigh and knee. 4. Hold this position for __________ seconds. Repeat __________ times. Complete this stretch __________ times a day. Exercise D: Hamstring, Supine 1. Lie on your back. 2. Loop a belt or towel over the ball of your left / right foot. The ball of your foot is on the walking surface, right under your toes. 3. Straighten your left / right knee and slowly pull on the belt to raise your leg until you feel a gentle stretch behind your knee. ? Do not let your left / right knee bend while you do this. ? Keep your other leg flat on the floor. 4. Hold this position for __________ seconds. Repeat __________ times. Complete this stretch __________ times a day. STRENGTHENING EXERCISES These exercises build strength and endurance in your knee. Endurance is the ability to use your muscles for a long time, even after they get tired. Exercise E: Quadriceps, Isometric  1. Lie on your back with your left / right leg extended and your other knee bent. Put a rolled towel or small pillow under your knee if told by your health care provider. 2. Slowly tense the muscles in the front of your left / right thigh.  You should see your kneecap slide up toward your hip or see increased dimpling just above the knee. This motion will push the back of the knee toward the floor. 3. For __________ seconds, keep the muscle as tight as you can without increasing your pain. 4. Relax the muscles slowly and completely. Repeat __________ times. Complete this exercise __________ times a day. Exercise F: Straight Leg Raises - Quadriceps 1. Lie on your back with your left / right leg extended and your other knee bent. 2. Tense the muscles in the front of your left / right thigh. You should see  your kneecap slide up or see increased dimpling just above the knee. Your thigh may even shake a bit. 3. Keep these muscles tight as you raise your leg 4-6 inches (10-15 cm) off the floor. Do not let your knee bend. 4. Hold this position for __________ seconds. 5. Keep these muscles tense as you lower your leg. 6. Relax your muscles slowly and completely after each repetition. Repeat __________ times. Complete this exercise __________ times a day. Exercise G: Hamstring, Isometric 1. Lie on your back on a firm surface. 2. Bend your left / right knee approximately __________ degrees. 3. Dig your left / right heel into the surface as if you are trying to pull it toward your buttocks. Tighten the muscles in the back of your thighs to dig as hard as you can without increasing any pain. 4. Hold this position for __________ seconds. 5. Release the tension gradually and allow your muscles to relax completely for __________ seconds after each repetition. Repeat __________ times. Complete this exercise __________ times a day. Exercise H: Hamstring Curls  If told by your health care provider, do this exercise while wearing ankle weights. Begin with __________ weights. Then increase the weight by 1 lb (0.5 kg) increments. Do not wear ankle weights that are more than __________. 1. Lie on your abdomen with your legs straight. 2. Bend your left / right knee as far as you can without feeling pain. Keep your hips flat against the floor. 3. Hold this position for __________ seconds. 4. Slowly lower your leg to the starting position.  Repeat __________ times. Complete this exercise __________ times a day. Exercise I: Squats (Quadriceps) 1. Stand in front of a table, with your feet and knees pointing straight ahead. You may rest your hands on the table for balance but not for support. 2. Slowly bend your knees and lower your hips like you are going to sit in a chair. ? Keep your weight over your heels, not  over your toes. ? Keep your lower legs upright so they are parallel with the table legs. ? Do not let your hips go lower than your knees. ? Do not bend lower than told by your health care provider. ? If your knee pain increases, do not bend as low. 3. Hold the squat position for __________ seconds. 4. Slowly push with your legs to return to standing. Do not use your hands to pull yourself to standing. Repeat __________ times. Complete this exercise __________ times a day. Exercise J: Wall Slides (Quadriceps)  1. Lean your back against a smooth wall or door while you walk your feet out 18-24 inches (46-61 cm) from it. 2. Place your feet hip-width apart. 3. Slowly slide down the wall or door until your knees bend __________ degrees. Keep your knees over your heels, not over your toes. Keep your knees in line with your hips. 4. Hold  for __________ seconds. Repeat __________ times. Complete this exercise __________ times a day. Exercise K: Straight Leg Raises - Hip Abductors 1. Lie on your side with your left / right leg in the top position. Lie so your head, shoulder, knee, and hip line up. You may bend your bottom knee to help you keep your balance. 2. Roll your hips slightly forward so your hips are stacked directly over each other and your left / right knee is facing forward. 3. Leading with your heel, lift your top leg 4-6 inches (10-15 cm). You should feel the muscles in your outer hip lifting. ? Do not let your foot drift forward. ? Do not let your knee roll toward the ceiling. 4. Hold this position for __________ seconds. 5. Slowly return your leg to the starting position. 6. Let your muscles relax completely after each repetition. Repeat __________ times. Complete this exercise __________ times a day. Exercise L: Straight Leg Raises - Hip Extensors 1. Lie on your abdomen on a firm surface. You can put a pillow under your hips if that is more comfortable. 2. Tense the muscles in your  buttocks and lift your left / right leg about 4-6 inches (10-15 cm). Keep your knee straight as you lift your leg. 3. Hold this position for __________ seconds. 4. Slowly lower your leg to the starting position. 5. Let your leg relax completely after each repetition. Repeat __________ times. Complete this exercise __________ times a day. This information is not intended to replace advice given to you by your health care provider. Make sure you discuss any questions you have with your health care provider. Document Released: 07/31/2005 Document Revised: 06/10/2016 Document Reviewed: 07/23/2015 Elsevier Interactive Patient Education  2018 [employer].

## 2018-01-13 NOTE — Progress Notes (Signed)
Dr. Ottie GlazierGunadasa requested a Behavioral Health Consult.   Presenting Issue:  Patient's sister died unexpectedly on March 31st.  They were in a restaurant.  Her sister went to the bathroom and never returned.    Report of symptoms:  Difficulty sleeping, tearfulness, sadness, feeling overwhelmed.    Duration of CURRENT symptoms:  Two weeks.  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  She has good family support.  Has asked for help from family and church community.  Is packing up the house where she and her sister lived as she will need to move.    Psychiatric History:  Did not assess.  Problem list did not note any psychiatric conditions.     Warmhandoff:    Warm Hand Off Completed.

## 2018-01-14 LAB — CBC
Hematocrit: 35.2 % (ref 34.0–46.6)
Hemoglobin: 11.8 g/dL (ref 11.1–15.9)
MCH: 31.8 pg (ref 26.6–33.0)
MCHC: 33.5 g/dL (ref 31.5–35.7)
MCV: 95 fL (ref 79–97)
PLATELETS: 241 10*3/uL (ref 150–379)
RBC: 3.71 x10E6/uL — AB (ref 3.77–5.28)
RDW: 14 % (ref 12.3–15.4)
WBC: 4.2 10*3/uL (ref 3.4–10.8)

## 2018-01-14 LAB — LIPID PANEL
CHOLESTEROL TOTAL: 229 mg/dL — AB (ref 100–199)
Chol/HDL Ratio: 4 ratio (ref 0.0–4.4)
HDL: 57 mg/dL (ref >39)
LDL Calculated: 152 mg/dL — ABNORMAL HIGH (ref 0–99)
TRIGLYCERIDES: 101 mg/dL (ref 0–149)
VLDL CHOLESTEROL CAL: 20 mg/dL (ref 5–40)

## 2018-01-14 LAB — IRON AND TIBC
IRON SATURATION: 93 % — AB (ref 15–55)
Iron: 259 ug/dL — ABNORMAL HIGH (ref 27–139)
Total Iron Binding Capacity: 279 ug/dL (ref 250–450)
UIBC: 20 ug/dL — ABNORMAL LOW (ref 118–369)

## 2018-01-14 LAB — BASIC METABOLIC PANEL
BUN/Creatinine Ratio: 12 (ref 12–28)
BUN: 10 mg/dL (ref 8–27)
CALCIUM: 9.6 mg/dL (ref 8.7–10.3)
CHLORIDE: 101 mmol/L (ref 96–106)
CO2: 25 mmol/L (ref 20–29)
Creatinine, Ser: 0.82 mg/dL (ref 0.57–1.00)
GFR calc Af Amer: 88 mL/min/{1.73_m2} (ref >59)
GFR calc non Af Amer: 76 mL/min/{1.73_m2} (ref >59)
Glucose: 121 mg/dL — ABNORMAL HIGH (ref 65–99)
POTASSIUM: 3.8 mmol/L (ref 3.5–5.2)
Sodium: 140 mmol/L (ref 134–144)

## 2018-01-14 LAB — HIV ANTIBODY (ROUTINE TESTING W REFLEX): HIV Screen 4th Generation wRfx: NONREACTIVE

## 2018-01-14 LAB — FERRITIN: Ferritin: 68 ng/mL (ref 15–150)

## 2018-01-14 LAB — HEPATITIS C ANTIBODY: Hep C Virus Ab: <0.1 s/co ratio (ref 0.0–0.9)

## 2018-01-15 ENCOUNTER — Encounter: Payer: Self-pay | Admitting: Internal Medicine

## 2018-01-15 ENCOUNTER — Telehealth: Payer: Self-pay | Admitting: Internal Medicine

## 2018-01-15 NOTE — Telephone Encounter (Signed)
Called patient to discuss lab results.  She is taking half tablet Rosuvastatin as she has side effects. We discussed lifestyle changes that can improve her cholesterol levels.  We discussed repeating lipid panel in 6 months

## 2018-01-15 NOTE — Telephone Encounter (Signed)
Attempted to call patient to discuss lab results. Went to voicemail. Left message to call back.  

## 2018-01-15 NOTE — Telephone Encounter (Signed)
Pt returned call. Call back number [phone] Shawna OrleansMeredith B Thomsen, RN

## 2018-01-25 ENCOUNTER — Other Ambulatory Visit: Payer: Self-pay | Admitting: Physician Assistant

## 2018-01-26 NOTE — Telephone Encounter (Signed)
Rx sent to pharmacy   

## 2018-02-04 ENCOUNTER — Telehealth: Payer: Self-pay | Admitting: Psychology

## 2018-02-04 NOTE — Telephone Encounter (Signed)
Called patient to follow-up as discussed.  Left a VM asking her to call back if she would like.

## 2018-02-16 ENCOUNTER — Ambulatory Visit: Payer: Self-pay | Admitting: Cardiovascular Disease

## 2018-03-09 ENCOUNTER — Encounter: Payer: Self-pay | Admitting: Physician Assistant

## 2018-03-09 ENCOUNTER — Ambulatory Visit: Payer: BLUE CROSS/BLUE SHIELD | Admitting: Physician Assistant

## 2018-03-09 VITALS — BP 128/82 | HR 69 | Ht 65.0 in | Wt 166.0 lb

## 2018-03-09 DIAGNOSIS — I10 Essential (primary) hypertension: Secondary | ICD-10-CM | POA: Diagnosis not present

## 2018-03-09 DIAGNOSIS — R0609 Other forms of dyspnea: Secondary | ICD-10-CM

## 2018-03-09 DIAGNOSIS — E7800 Pure hypercholesterolemia, unspecified: Secondary | ICD-10-CM

## 2018-03-09 DIAGNOSIS — R600 Localized edema: Secondary | ICD-10-CM

## 2018-03-09 DIAGNOSIS — I2510 Atherosclerotic heart disease of native coronary artery without angina pectoris: Secondary | ICD-10-CM | POA: Diagnosis not present

## 2018-03-09 NOTE — Patient Instructions (Addendum)
Medication Instructions:  Your physician recommends that you continue on your current medications as directed. Please refer to the Current Medication list given to you today. HOLD YOUR IRON UNTIL YOU SPEAK WITH YOUR PCP  Labwork: None   Testing/Procedures: Your physician has requested that you have an echocardiogram. Echocardiography is a painless test that uses sound waves to create images of your heart. It provides your doctor with information about the size and shape of your heart and how well your heart's chambers and valves are working. This procedure takes approximately one hour. There are no restrictions for this procedure. [address]  Follow-Up: Your physician wants you to follow-up in: 12 months with Dr Royann Shiversroitoru. You will receive a reminder letter in the mail two months in advance. If you don't receive a letter, please call our office to schedule the follow-up appointment.  Your physician recommends that you schedule a follow-up appointment in: 1ST AVAILABLE APPT IN LIPID CLINIC  Any Other Special Instructions Will Be Listed Below (If Applicable).  If you need a refill on your cardiac medications before your next appointment, please call your pharmacy.

## 2018-03-09 NOTE — Progress Notes (Signed)
Agree, very good PCSK9 candidate MCr

## 2018-03-09 NOTE — Progress Notes (Signed)
Cardiology Office Note   Date:  03/09/2018   ID:  Stacy Horton, DOB 02/06/1956, MRN [id number]  PCP:  Howard PouchFeng, Lauren, MD  Cardiologist: Dr. Royann Horton, 02/27/2017 Stacy Demarkhonda Barrett, PA-C   Chief Complaint  Patient presents with  . Follow-up  . Foot Swelling    feet swelling    History of Present Illness: Stacy CarrowSheliah A Horton is a 62 y.o. female with a history of HTN, DM2 diet-controlled, HLD, 3.0 x 16 mm Synergy DES LAD 10/2016, intol statins on weekly Crestor, IBS, obesity, Jehovah's Withness  02/27/2017 office visit, pre-PCI symptoms improved, take Crestor daily and if does not tolerate consider PCSK9  Stacy Horton presents for cardiology follow up.  She has occasional chest pain, relieved by antacids and burping. Has not had to use nitro.   She has cut back on exercise, did this after the SCD of her sister March 31st.   Agrees that she needs to focus on her own health.  Is willing to increase her activity.  She has RLE edema more so than LLE edema. Has had for years. Worse in the summer.  Does not really wake with it, but gets it during the day.  Has some DOE, worse on some days than others. She has been eating out more.   She got some muscle aches with qod Crestor.   She is on iron supplements. Recent labs showed an iron level of 259. Has not seen her PCP since then.   Past Medical History:  Diagnosis Date  . Coronary artery disease    a. 10/2016: LHC with 95% mLAD s/p DES, mod non obst dz in dLAD and OM1  . GERD (gastroesophageal reflux disease)   . Hemorrhoids   . Hyperlipidemia    a. intolerant to multiple statins and Zetia  . Hypertension   . IBS (irritable bowel syndrome)   . Lower extremity edema   . Obesity   . Refusal of blood transfusions as patient is Jehovah's Witness   . Type II diabetes mellitus (HCC)     Past Surgical History:  Procedure Laterality Date  . ABDOMINAL HYSTERECTOMY  1990s  . BREAST EXCISIONAL BIOPSY Left 2008  . CARPAL  TUNNEL RELEASE Bilateral 2011-2012  . CORONARY ANGIOPLASTY WITH STENT PLACEMENT  11/08/2016  . CORONARY STENT INTERVENTION N/A 11/08/2016   Procedure: Coronary Stent Intervention;  Surgeon: Yvonne Kendallhristopher End, MD;  Location: [facility];  Service: Cardiovascular;  Laterality: N/A;  . LEFT HEART CATH AND CORONARY ANGIOGRAPHY N/A 11/08/2016   Procedure: Left Heart Cath and Coronary Angiography;  Surgeon: Yvonne Kendallhristopher End, MD;  [facility];  Service: Cardiovascular;  Laterality: N/A;  . NM MYOVIEW LTD  03/15/2008   no ischemia  . US ECHOCARDIOGRAPHY  09/28/2010   normal    Current Outpatient Medications  Medication Sig Dispense Refill  . aspirin 81 MG chewable tablet Chew 1 tablet (81 mg total) by mouth daily.    . Biotin 5000 MCG TABS Take 1 tablet by mouth daily.    . carvedilol (COREG) 12.5 MG tablet TAKE 1 TABLET (12.5 MG TOTAL) BY MOUTH 2 (TWO) TIMES DAILY. 180 tablet 3  . Cholecalciferol (VITAMIN D3) 2000 units CHEW Chew 1 each by mouth daily.    . clopidogrel (PLAVIX) 75 MG tablet TAKE 1 TABLET (75 MG TOTAL) BY MOUTH DAILY WITH BREAKFAST. 60 tablet 0  . docusate sodium (COLACE) 100 MG capsule Take 100 mg by mouth daily.    . ferrous sulfate 325 (  65 FE) MG tablet Take 1 tablet (325 mg total) by mouth daily with breakfast. 90 tablet 3  . linaclotide (LINZESS) 145 MCG CAPS capsule Take 145 mcg by mouth daily before breakfast.    . lisinopril-hydrochlorothiazide (PRINZIDE,ZESTORETIC) 20-12.5 MG tablet 12/23. TAKE 2 TABLETS BY MOUTH DAILY. 180 tablet 0  . naproxen (NAPROSYN) 500 MG tablet Take 1 tablet (500 mg total) by mouth 2 (two) times daily with a meal. For 3 days, then use as needed. 15 tablet 0  . nitroGLYCERIN (NITROSTAT) 0.4 MG SL tablet Place 1 tablet (0.4 mg total) under the tongue every 5 (five) minutes as needed. 25 tablet 3  . polyethylene glycol (MIRALAX / GLYCOLAX) packet Take 17 g by mouth daily.    . rosuvastatin (CRESTOR) 5 MG tablet Take 1 tablet (5 mg  total) by mouth daily. 30 tablet 11  . Saccharomyces boulardii (FLORASTOR PO) Take 1 capsule by mouth daily.    . valACYclovir (VALTREX) 500 MG tablet Take 500 mg by mouth daily.  0   No current facility-administered medications for this visit.     Allergies:   Ezetimibe and Statins    Social History:  The patient  reports that she has never smoked. She has never used smokeless tobacco. She reports that she drinks about 1.2 oz of alcohol per week. She reports that she does not use drugs.   Family History:  The patient's family history includes Breast cancer in her other; Cancer (age of onset: 17) in her father; Congestive Heart Failure in her mother; Heart attack (age of onset: 58) in her sister.    ROS:  Please see the history of present illness. All other systems are reviewed and negative.    PHYSICAL EXAM: VS:  BP 128/82   Pulse 69   Ht 5\' 5"  (1.651 m)   Wt 166 lb (75.3 kg)   BMI 27.62 kg/m  , BMI Body mass index is 27.62 kg/m. GEN: Well nourished, well developed, female in no acute distress  HEENT: normal for age  Neck: no JVD, no carotid bruit, no masses Cardiac: RRR; 2/6 murmur, no rubs, or gallops Respiratory:  clear to auscultation bilaterally, normal work of breathing GI: soft, nontender, nondistended, + BS MS: no deformity or atrophy; 1-2+ pedal edema; distal pulses are 2+ in all 4 extremities   Skin: warm and dry, no rash Neuro:  Strength and sensation are intact Psych: euthymic mood, full affect   EKG:  EKG is ordered today. The ekg ordered today demonstrates sinus rhythm, possible LVH, inferior Q waves as well as V1 and V2, no significant change from February 2018  CARDIAC CATH: 10/2016 Conclusions: 1. Significant single-vessel coronary artery disease with 95% stenosis of the mid LAD immediately distal to D1. D1 also has approximately 30% ostial stenosis, increased to 50% post PCI to the LAD secondary to plaque shift. 2. Mild to moderate, nonobstructive CAD  involving the distal LAD and large OM1 branch. 3. Mildly elevated left ventricular filling pressure. 4. Successful PCI to the mid LAD with placement of a Synergy 3.0 x 16 mm drug-eluting stent, postdilated with a 3.5 mm noncompliant balloon at high pressure.  Recommendations: 1. Admit for overnight extended recovery. 2. TR band protocol. 3. Dual antiplatelet therapy with aspirin and clopidogrel for at least 6 months, ideally longer. 4. Aggressive secondary prevention including retrial of statin therapy versus initiation of PCSK9 inhibitor. Patient has agreed to a trial of once weekly low-dose rosuvastatin. Post-Intervention Diagram  ECHO: Performed 2011 EF normal and normal pressures   Recent Labs: 01/13/2018: BUN 10; Creatinine, Ser 0.82; Hemoglobin 11.8; Platelets 241; Potassium 3.8; Sodium 140    Lipid Panel    Component Value Date/Time   CHOL 229 (H) 01/13/2018 1110   TRIG 101 01/13/2018 1110   HDL 57 01/13/2018 1110   CHOLHDL 4.0 01/13/2018 1110   CHOLHDL 5.2 (H) 11/13/2015 1008   VLDL 18 11/13/2015 1008   LDLCALC 152 (H) 01/13/2018 1110   LDLDIRECT 129 (H) 12/30/2008 1930     Wt Readings from Last 3 Encounters:  03/09/18 166 lb (75.3 kg)  01/13/18 169 lb (76.7 kg)  06/06/17 167 lb 12.3 oz (76.1 kg)     Other studies Reviewed: Additional studies/ records that were reviewed today include: Office notes, hospital records and testing.  ASSESSMENT AND PLAN:  1.  CAD: Continue baby aspirin, carvedilol, Plavix ACE inhibitor and sublingual nitroglycerin as needed.  No ischemic testing needed at this time.  She is encouraged to increase active activity and agrees to restart her exercise program with walking the dogs and adding other things she was doing.  Let us know if she gets symptoms.  2.  Hypertension: Blood pressures well controlled on current medications, no change.  3.  Hyperlipidemia: Her LDL was 152 in April 2019 and she is unable to tolerate high-dose  statins.  I explained to her the concept of PCSK9 inhibitors and she agrees to be referred to the pharmacist for lipid management.  4.  Lower extremity edema and dyspnea on exertion: She also has a systolic murmur, not previously commented on.  Her weight is stable, she does not have JVD but I am concerned about the symptoms.  We will check an echocardiogram and follow-up on the results.    Current medicines are reviewed at length with the patient today.  The patient does not have concerns regarding medicines.  The following changes have been made:  no change  Labs/ tests ordered today include:   Orders Placed This Encounter  Procedures  . EKG 12-Lead  . ECHOCARDIOGRAM COMPLETE     Disposition:   FU with Dr. Royann Shivers  Signed, Stacy Demark, PA-C  03/09/2018 5:04 PM    [facility] Phone: [phone]; Fax: [phone]  This note was written with the assistance of speech recognition software. Please excuse any transcriptional errors.

## 2018-03-18 ENCOUNTER — Ambulatory Visit (HOSPITAL_COMMUNITY): Payer: BLUE CROSS/BLUE SHIELD | Attending: Cardiology

## 2018-03-18 ENCOUNTER — Other Ambulatory Visit: Payer: Self-pay

## 2018-03-18 DIAGNOSIS — I10 Essential (primary) hypertension: Secondary | ICD-10-CM

## 2018-03-18 DIAGNOSIS — I081 Rheumatic disorders of both mitral and tricuspid valves: Secondary | ICD-10-CM | POA: Diagnosis not present

## 2018-03-18 DIAGNOSIS — E7800 Pure hypercholesterolemia, unspecified: Secondary | ICD-10-CM | POA: Insufficient documentation

## 2018-03-18 DIAGNOSIS — R600 Localized edema: Secondary | ICD-10-CM | POA: Diagnosis not present

## 2018-03-18 DIAGNOSIS — I2510 Atherosclerotic heart disease of native coronary artery without angina pectoris: Secondary | ICD-10-CM | POA: Insufficient documentation

## 2018-03-18 DIAGNOSIS — E785 Hyperlipidemia, unspecified: Secondary | ICD-10-CM | POA: Diagnosis not present

## 2018-03-18 DIAGNOSIS — R0600 Dyspnea, unspecified: Secondary | ICD-10-CM | POA: Insufficient documentation

## 2018-03-18 DIAGNOSIS — R0609 Other forms of dyspnea: Secondary | ICD-10-CM | POA: Insufficient documentation

## 2018-03-25 ENCOUNTER — Ambulatory Visit: Payer: BLUE CROSS/BLUE SHIELD | Admitting: Pharmacist Clinician (PhC)/ Clinical Pharmacy Specialist

## 2018-03-25 ENCOUNTER — Other Ambulatory Visit: Payer: Self-pay | Admitting: Cardiovascular Disease

## 2018-03-25 DIAGNOSIS — E7800 Pure hypercholesterolemia, unspecified: Secondary | ICD-10-CM | POA: Diagnosis not present

## 2018-03-25 NOTE — Progress Notes (Signed)
03/26/2018 Stacy Horton 12/26/1955 [id number]   HPI:  Stacy Horton is a 62 y.o. female patient of Dr Royann Shiversroitoru, who presents today for a lipid clinic evaluation.  She was referred by Theodore Demarkhonda Barrett PA-C.  In addition to hyperlipidemia, her medical history is significant for hypertension, DM2 and CAD s/p DES to LAD in Feb 2018.  She has not tolerated statins due to myalgia.     Today she reports feeling well.  Has not had any cholesterol medications for about 3-4 months, but is concerned about her blood levels and would like to do what she can to get them down to a safe level.    Current Medications:  none Cholesterol Goals:   LDL < 70  Intolerant/previously tried:  Ezetimibe, atorvastatin, pravastatin, simvastatin, daily rosuvastatin all caused myalgias  Family history:   Younger sister died earlier this year from MI (age 64)  Diet:   Mix of home/eat out - sit down, cafeteria style; avoids fried foods; eats veggies/salads regularly  Exercise:    Walks dogs up to 1 hour per day; has treadmill at home, tries to get up to an hour per day if not walking dogs  Labs:   12/2017:  TC 229, TG 101, HDL 57, LDL 152 - rosuvastatin 5 mg weekly  Current Outpatient Medications  Medication Sig Dispense Refill  . aspirin 81 MG chewable tablet Chew 1 tablet (81 mg total) by mouth daily.    . Biotin 5000 MCG TABS Take 1 tablet by mouth daily.    . carvedilol (COREG) 12.5 MG tablet TAKE 1 TABLET (12.5 MG TOTAL) BY MOUTH 2 (TWO) TIMES DAILY. 180 tablet 3  . Cholecalciferol (VITAMIN D3) 2000 units CHEW Chew 1 each by mouth daily.    . clopidogrel (PLAVIX) 75 MG tablet TAKE 1 TABLET (75 MG TOTAL) BY MOUTH DAILY WITH BREAKFAST. 60 tablet 7  . docusate sodium (COLACE) 100 MG capsule Take 100 mg by mouth daily.    . ferrous sulfate 325 (65 FE) MG tablet Take 1 tablet (325 mg total) by mouth daily with breakfast. 90 tablet 3  . linaclotide (LINZESS) 145 MCG CAPS capsule Take 145 mcg by mouth  daily before breakfast.    . lisinopril-hydrochlorothiazide (PRINZIDE,ZESTORETIC) 20-12.5 MG tablet 12/23. TAKE 2 TABLETS BY MOUTH DAILY. 180 tablet 0  . naproxen (NAPROSYN) 500 MG tablet Take 1 tablet (500 mg total) by mouth 2 (two) times daily with a meal. For 3 days, then use as needed. 15 tablet 0  . nitroGLYCERIN (NITROSTAT) 0.4 MG SL tablet Place 1 tablet (0.4 mg total) under the tongue every 5 (five) minutes as needed. 25 tablet 3  . polyethylene glycol (MIRALAX / GLYCOLAX) packet Take 17 g by mouth daily.    . rosuvastatin (CRESTOR) 5 MG tablet Take 1 tablet (5 mg total) by mouth daily. 30 tablet 11  . Saccharomyces boulardii (FLORASTOR PO) Take 1 capsule by mouth daily.    . valACYclovir (VALTREX) 500 MG tablet Take 500 mg by mouth daily.  0   No current facility-administered medications for this visit.     Allergies  Allergen Reactions  . Ezetimibe Other (See Comments)    myalgias  . Statins Other (See Comments)    Pt has tried Atorvastatin, Crestor, Pravastatin, Simvastatin - all caused myalgias    Past Medical History:  Diagnosis Date  . Coronary artery disease    a. 10/2016: LHC with 95% mLAD s/p DES, mod non obst dz in dLAD and OM1  .  GERD (gastroesophageal reflux disease)   . Hemorrhoids   . Hyperlipidemia    a. intolerant to multiple statins and Zetia  . Hypertension   . IBS (irritable bowel syndrome)   . Lower extremity edema   . Obesity   . Refusal of blood transfusions as patient is Jehovah's Witness   . Type II diabetes mellitus (HCC)     There were no vitals taken for this visit.   Hypercholesterolemia Patient with CAD and hyperlipidemia, has been unable to tolerate daily statin medications.  Will have her re-challenge with rosuvastatin 5 mg, 1/2 tablet twice weekly (Monday and Thursday).  In addition to this, will also get approval from her insurance to start PCSK-9i.  She would prefer to use the monthly Pushtronix device.  Once she has had 3 doses of  this, we will repeat labs to be sure the medication is effective.    Phillips Hay PharmD CPP [facility] [address] [place], [place] [address]

## 2018-03-25 NOTE — Patient Instructions (Signed)
Start rosuvastatin 2.5 mg (1/2 tablet) twice weekly (Mondays and Thursdays).  If you are unable to tolerate this, then stop.  We will start the process to get Repatha approved by your insurance company.  Once we have that approval we will call you.    Evolocumab injection What is this medicine? EVOLOCUMAB (e voe LOK ue mab) is known as a PCSK9 inhibitor. It is used to lower the level of cholesterol in the blood. It may be used alone or in combination with other cholesterol-lowering drugs. This drug may also be used to reduce the risk of heart attack, stroke, and certain types of heart surgery in patients with heart disease. This medicine may be used for other purposes; ask your health care provider or pharmacist if you have questions. COMMON BRAND NAME(S): REPATHA What should I tell my health care provider before I take this medicine? They need to know if you have any of these conditions: -an unusual or allergic reaction to evolocumab, other medicines, foods, dyes, or preservatives -pregnant or trying to get pregnant -breast-feeding How should I use this medicine? This medicine is for injection under the skin. You will be taught how to prepare and give this medicine. Use exactly as directed. Take your medicine at regular intervals. Do not take your medicine more often than directed. It is important that you put your used needles and syringes in a special sharps container. Do not put them in a trash can. If you do not have a sharps container, call your pharmacist or health care provider to get one. Talk to your pediatrician regarding the use of this medicine in children. While this drug may be prescribed for children as young as 13 years for selected conditions, precautions do apply. Overdosage: If you think you have taken too much of this medicine contact a poison control center or emergency room at once. NOTE: This medicine is only for you. Do not share this medicine with others. What if I miss  a dose? If you miss a dose, take it as soon as you can if there are more than 7 days until the next scheduled dose, or skip the missed dose and take the next dose according to your original schedule. Do not take double or extra doses. What may interact with this medicine? Interactions are not expected. This list may not describe all possible interactions. Give your health care provider a list of all the medicines, herbs, non-prescription drugs, or dietary supplements you use. Also tell them if you smoke, drink alcohol, or use illegal drugs. Some items may interact with your medicine. What should I watch for while using this medicine? You may need blood work while you are taking this medicine. What side effects may I notice from receiving this medicine? Side effects that you should report to your doctor or health care professional as soon as possible: -allergic reactions like skin rash, itching or hives, swelling of the face, lips, or tongue -signs and symptoms of infection like fever or chills; cough; sore throat; pain or trouble passing urine Side effects that usually do not require medical attention (report to your doctor or health care professional if they continue or are bothersome): -diarrhea -nausea -muscle pain -pain, redness, or irritation at site where injected This list may not describe all possible side effects. Call your doctor for medical advice about side effects. You may report side effects to FDA at 1-800-FDA-1088. Where should I keep my medicine? Keep out of the reach of children. You will  be instructed on how to store this medicine. Throw away any unused medicine after the expiration date on the label. NOTE: This sheet is a summary. It may not cover all possible information. If you have questions about this medicine, talk to your doctor, pharmacist, or health care provider.  2018 Elsevier/Gold Standard (2016-09-02 13:21:53)

## 2018-03-26 ENCOUNTER — Encounter: Payer: Self-pay | Admitting: Pharmacist Clinician (PhC)/ Clinical Pharmacy Specialist

## 2018-03-26 NOTE — Assessment & Plan Note (Signed)
Patient with CAD and hyperlipidemia, has been unable to tolerate daily statin medications.  Will have her re-challenge with rosuvastatin 5 mg, 1/2 tablet twice weekly (Monday and Thursday).  In addition to this, will also get approval from her insurance to start PCSK-9i.  She would prefer to use the monthly Pushtronix device.  Once she has had 3 doses of this, we will repeat labs to be sure the medication is effective.

## 2018-04-05 ENCOUNTER — Other Ambulatory Visit: Payer: Self-pay | Admitting: Student

## 2018-04-06 NOTE — Telephone Encounter (Signed)
Rx sent to pharmacy   

## 2018-04-09 ENCOUNTER — Telehealth: Payer: Self-pay | Admitting: Pharmacist Clinician (PhC)/ Clinical Pharmacy Specialist

## 2018-04-09 ENCOUNTER — Other Ambulatory Visit: Payer: Self-pay | Admitting: Student in an Organized Health Care Education/Training Program

## 2018-04-09 MED ORDER — EVOLOCUMAB 140 MG/ML ~~LOC~~ SOAJ: 140.0000 mg | SUBCUTANEOUS | 12 refills | Status: AC

## 2018-04-09 NOTE — Telephone Encounter (Signed)
Repatha approved to 03/31/19.  LMOM for patient to determine which pharmacy.

## 2018-04-09 NOTE — Telephone Encounter (Signed)
Patient knows to call if cost prohibitive.  Would like assistance with first injection.  Advised her to call once she has syringes

## 2018-04-10 ENCOUNTER — Other Ambulatory Visit: Payer: Self-pay | Admitting: Cardiovascular Disease

## 2018-04-10 NOTE — Telephone Encounter (Signed)
Rx(s) sent to pharmacy electronically.  

## 2018-04-13 NOTE — Telephone Encounter (Signed)
[facility]

## 2018-04-13 NOTE — Telephone Encounter (Signed)
No update from pharmacy yet. Patient will call pharmacy to follow up

## 2018-04-15 NOTE — Telephone Encounter (Signed)
Pt presented with Repatha for teaching today in office. She demonstrated appropriate injection technique and we reviewed dosing schedule. Pt was appreciative for help. Will route to LinwoodKristin and Raquel as FYI and for need of follow up labs (if needed).

## 2018-04-26 ENCOUNTER — Other Ambulatory Visit: Payer: Self-pay | Admitting: Student

## 2018-04-27 NOTE — Telephone Encounter (Signed)
This is Dr. Croitoru's pt 

## 2018-04-28 NOTE — Telephone Encounter (Signed)
Order Providers   Prescribing Provider Encounter Provider  NeskowinStrader, Lennart PallBrittany M, PA-C Ellsworth LennoxStrader, Brittany M, [place]PA-C  Outpatient Medication Detail    Disp Refills Start End   carvedilol (COREG) 12.5 MG tablet 180 tablet 3 09/12/2017    Sig - Route: TAKE 1 TABLET (12.5 MG TOTAL) BY MOUTH 2 (TWO) TIMES DAILY. - Oral   Sent to pharmacy as: carvedilol (COREG) 12.5 MG tablet   E-Prescribing Status: Receipt confirmed by pharmacy (09/12/2017 2:14 PM EST)   Pharmacy   [id number] - [place], [place] - [address]

## 2018-05-18 ENCOUNTER — Ambulatory Visit: Payer: BLUE CROSS/BLUE SHIELD | Admitting: Internal Medicine

## 2018-06-25 ENCOUNTER — Telehealth: Payer: Self-pay | Admitting: Cardiovascular Disease

## 2018-06-25 NOTE — Telephone Encounter (Signed)
New Message:       Patient is requesting a letter stating she will need to keep her dogs for exercise purposes due to her heart condition

## 2018-06-25 NOTE — Telephone Encounter (Signed)
Per OV with Theodore Demark PA: 1.  CAD: Continue baby aspirin, carvedilol, Plavix ACE inhibitor and sublingual nitroglycerin as needed.  No ischemic testing needed at this time.  She is encouraged to increase active activity and agrees to restart her exercise program with walking the dogs and adding other things she was doing.  Let us know if she gets symptoms.    Routed to PA to review

## 2018-06-26 NOTE — Telephone Encounter (Signed)
I do not understand the question.  She is okay to start walking her dogs.  Is that the question? If not, please send another message and let me know you did that by text message so I can respond promptly Thanks

## 2018-06-26 NOTE — Telephone Encounter (Signed)
Left message to call back  

## 2018-07-01 ENCOUNTER — Telehealth: Payer: Self-pay

## 2018-07-01 NOTE — Telephone Encounter (Signed)
I don't feel comfortable doing that. Therapeutic animals are usually for anxiety issues, which we don't treat.  Sorry.

## 2018-07-01 NOTE — Telephone Encounter (Signed)
Patient noting that she did well with repatha for first several doses, but now is noticing severe headaches and backaches for several days after her dose.  Would prefer to take once monthly  Advised that she come by and get samples of Praluent - will see if she can tolerate this better than Repatha.  Patient voiced understanding

## 2018-07-01 NOTE — Telephone Encounter (Signed)
Patiemt states she is having side effects form taking Reptha and wants to know if she can take the injection once a month. She wants to know what to do. Requesting someone give her a call.

## 2018-07-01 NOTE — Telephone Encounter (Signed)
I spoke with patient she states she has a heart stent and was told to exercise daily. She states she has been walking her toy poodle and chihuahua daily and that's how she gets her exercise. She recently moved into an apartment that does not allow pets. Her question is can we write her a note stating "her dogs are used for therapeutic use"; so she can keep her dogs. I told her I'm not sure if we can do this and that I would speak with Bjorn Loser and give her a call back. She voiced understanding.

## 2018-07-07 NOTE — Telephone Encounter (Signed)
Left message on patients voicemail with Rhonda's recommendations and advised patient to contact office with any questions or concerns.

## 2018-07-17 ENCOUNTER — Telehealth: Payer: Self-pay | Admitting: Student in an Organized Health Care Education/Training Program

## 2018-07-17 NOTE — Telephone Encounter (Signed)
Pt is calling to let Dr. Mosetta Putt know that she is faxing a form to be completed for her dogs to be listed as emotional support animals. She has recently lost her sister and states she needs them for therapeutic reasons. Her new apartment complex does not accept these animals without the form being completed. Pt would like for someone to call her to let her know if this is something Dr. Mosetta Putt will be able to do.

## 2018-07-22 NOTE — Telephone Encounter (Signed)
LVM. Would be helpful to have more information about the dogs and how they help her emotionally. Asked patient to call back and leave a time when she may be reached. She can send over the form for me to take a look at it but I still need to speak with her prior to filling it out.

## 2018-07-23 NOTE — Telephone Encounter (Signed)
Patient left message that between 930am - 2pm is a good time for her everyday.  Call back is [phone]  Ples Specter, RN [facility] RN)

## 2018-07-23 NOTE — Telephone Encounter (Signed)
Attempted to call patient again without success in reaching her. Can try again tomorrow.

## 2018-07-27 NOTE — Telephone Encounter (Signed)
Patient left message she is off work today and available to discuss via phone or can come in.  Call back is [phone]  Ples Specter, RN [facility] RN)

## 2018-07-28 ENCOUNTER — Encounter: Payer: Self-pay | Admitting: Student in an Organized Health Care Education/Training Program

## 2018-07-28 NOTE — Telephone Encounter (Signed)
Pt calls again to check status. Advised that MD has attempted to contact x 2.  She asked to reached between 9:30 - 2pm OR after 5:30.  Will forward to MD. Dhruti Ghuman, Maryjo Rochester, CMA

## 2018-07-28 NOTE — Telephone Encounter (Signed)
Called and spoke with the patient. She has had a tough year with the passing of her sister and now she must move out of the apartment that she shared with her sister. Her new apartment only allows dogs if they are "emotional support animals."  She asks for a letter indicating that she finds comfort in her two dogs (poodle and chihuahua) after her difficult year emotionally. I let her know that I can write this letter however I cannot speak to the safety of having the dogs in the building (ie I do not know the dogs and cannot speak to whether they would bite another person, etc) she expresses understanding and would still like me to write the letter.  Letter to be available at the front desk starting tomorrow.

## 2018-09-07 ENCOUNTER — Telehealth: Payer: Self-pay | Admitting: Cardiovascular Disease

## 2018-09-07 NOTE — Telephone Encounter (Signed)
Encounter not needed

## 2018-09-08 ENCOUNTER — Ambulatory Visit: Payer: BLUE CROSS/BLUE SHIELD

## 2018-09-08 ENCOUNTER — Other Ambulatory Visit: Payer: Self-pay

## 2018-09-08 ENCOUNTER — Ambulatory Visit: Payer: BLUE CROSS/BLUE SHIELD | Admitting: Family Medicine

## 2018-09-08 VITALS — BP 130/68 | HR 80 | Temp 98.6°F | Ht 65.0 in | Wt 172.5 lb

## 2018-09-08 DIAGNOSIS — M5412 Radiculopathy, cervical region: Secondary | ICD-10-CM | POA: Diagnosis not present

## 2018-09-08 MED ORDER — MELOXICAM 15 MG PO TABS: 15.0000 mg | ORAL_TABLET | Freq: Every day | ORAL | 0 refills | Status: AC

## 2018-09-08 NOTE — Progress Notes (Signed)
    Subjective:    Patient ID: Stacy Horton, female    DOB: 05/22/1956, 62 y.o.   MRN: [id number]   CC: left sided neck pain  HPI: patient reports pain on the left side of her neck into her shoulder and radiating down her arm for past 7-10 days. She reports intermittent numbness and tingling in her forearm. She reports it is difficult to sleep at night on the left side as this makes the pain worse. She has taken aleve with improvement in pain. She has recently moved and done a lot of lifting of boxes and things that she thinks may have caused this pain. She has never had neck injury or shoulder pain before. She has had muscle pains in past with cholesterol medication and is wondering if the repatha she is on is causing this issue. She also has had bilateral carpal tunnel in the past but does not feel this is related to that although the numbness/tingling sensation in her upper arm is similar to the issue she had with carpal tunnel.  Smoking status reviewed- non smoker  Review of Systems- see HPI   Objective:  BP 130/68   Pulse 80   Temp 98.6 F (37 C) (Oral)   Ht 5\' 5"  (1.651 m)   Wt 172 lb 8 oz (78.2 kg)   SpO2 97%   BMI 28.71 kg/m  Vitals and nursing note reviewed  General: well nourished, in no acute distress HEENT: normocephalic, MMM Neck: supple, no LAD. Tender to palpation of paraspinal musculature on the left side. Full flexion and extension of neck. Limited in rotation and sidebending to the left Cardiac: RRR, clear S1 and S2, no murmurs, rubs, or gallops Respiratory: clear to auscultation bilaterally, no increased work of breathing Extremities: no edema or cyanosis. +2 radial pulses bilaterally. Negative empty can test. Skin: warm and dry, no rashes noted Neuro: alert and oriented, no focal deficits. Sensation in tact in upper extremities. Strength 5/5 in upper extremities.    Assessment & Plan:    Cervical radiculopathy  History and exam consistent with  cervical radiculopathy. She is neurologically in tact in her upper extremities but has intermittent numbness/tingling of left arm. No weakness on exam. Discussed with patient we should try conservative measures first- rest, heat, antiinflammatory medication, gentle stretches. If pain persists or fails to improve would consider PT and neck imaging as next steps. If nerve compression by cervical vertebrae would need referral to specialist for intervention. Patient verbalized understanding and agreement with plan. Handout given with information about diagnosis.     Return in about 10 days (around 09/18/2018).   Dolores PattyAngela Bradyn Vassey, DO Family Medicine Resident PGY-3

## 2018-09-08 NOTE — Assessment & Plan Note (Addendum)
  History and exam consistent with cervical radiculopathy. She is neurologically in tact in her upper extremities but has intermittent numbness/tingling of left arm. No weakness on exam. Discussed with patient we should try conservative measures first- rest, heat, antiinflammatory medication, gentle stretches. If pain persists or fails to improve would consider PT and neck imaging as next steps. If nerve compression by cervical vertebrae would need referral to specialist for intervention. Patient verbalized understanding and agreement with plan. Handout given with information about diagnosis.

## 2018-09-08 NOTE — Patient Instructions (Signed)
Please take the anti-inflammatory medication every day for 10 days Use a heating pad to help loosen up muscles Do the gentle neck and shoulder exercises I showed you  Hopefully this resolves your pain, if not please come back and we can talk about next steps.  If you have questions or concerns please do not hesitate to call at [phone].  Dolores Patty, DO PGY-3, [facility] 09/08/2018 11:25 AM   Cervical Radiculopathy Cervical radiculopathy happens when a nerve in the neck (cervical nerve) is pinched or bruised. This condition can develop because of an injury or as part of the normal aging process. Pressure on the cervical nerves can cause pain or numbness that runs from the neck all the way down into the arm and fingers. Usually, this condition gets better with rest. Treatment may be needed if the condition does not improve. What are the causes? This condition may be caused by:  Injury.  Slipped (herniated) disk.  Muscle tightness in the neck because of overuse.  Arthritis.  Breakdown or degeneration in the bones and joints of the spine (spondylosis) due to aging.  Bone spurs that may develop near the cervical nerves.  What are the signs or symptoms? Symptoms of this condition include:  Pain that runs from the neck to the arm and hand. The pain can be severe or irritating. It may be worse when the neck is moved.  Numbness or weakness in the affected arm and hand.  How is this diagnosed? This condition may be diagnosed based on symptoms, medical history, and a physical exam. You may also have tests, including:  X-rays.  CT scan.  MRI.  Electromyogram (EMG).  Nerve conduction tests.  How is this treated? In many cases, treatment is not needed for this condition. With rest, the condition usually gets better over time. If treatment is needed, options may include:  Wearing a soft neck collar for short periods of time.  Physical therapy to  strengthen your neck muscles.  Medicines, such as NSAIDs, oral corticosteroids, or spinal injections.  Surgery. This may be needed if other treatments do not help. Various types of surgery may be done depending on the cause of your problems.  Follow these instructions at home: Managing pain  Take over-the-counter and prescription medicines only as told by your health care provider.  If directed, apply ice to the affected area. ? Put ice in a plastic bag. ? Place a towel between your skin and the bag. ? Leave the ice on for 20 minutes, 2-3 times per day.  If ice does not help, you can try using heat. Take a warm shower or warm bath, or use a heat pack as told by your health care provider.  Try a gentle neck and shoulder massage to help relieve symptoms. Activity  Rest as needed. Follow instructions from your health care provider about any restrictions on activities.  Do stretching and strengthening exercises as told by your health care provider or physical therapist. General instructions  If you were given a soft collar, wear it as told by your health care provider.  Use a flat pillow when you sleep.  Keep all follow-up visits as told by your health care provider. This is important. Contact a health care provider if:  Your condition does not improve with treatment. Get help right away if:  Your pain gets much worse and cannot be controlled with medicines.  You have weakness or numbness in your hand, arm, face, or leg.  You have a high fever.  You have a stiff, rigid neck.  You lose control of your bowels or your bladder (have incontinence).  You have trouble with walking, balance, or speaking. This information is not intended to replace advice given to you by your health care provider. Make sure you discuss any questions you have with your health care provider. Document Released: 06/11/2001 Document Revised: 02/22/2016 Document Reviewed: 11/10/2014 Elsevier Interactive  Patient Education  [employer]2018 [employer]

## 2018-09-15 ENCOUNTER — Encounter (HOSPITAL_COMMUNITY): Payer: Self-pay | Admitting: Emergency Medicine

## 2018-09-15 ENCOUNTER — Other Ambulatory Visit: Payer: Self-pay

## 2018-09-15 ENCOUNTER — Ambulatory Visit (HOSPITAL_COMMUNITY)
Admission: EM | Admit: 2018-09-15 | Discharge: 2018-09-15 | Disposition: A | Discharge status: Home / Self Care | Payer: BLUE CROSS/BLUE SHIELD | Attending: Family Medicine | Admitting: Family Medicine

## 2018-09-15 DIAGNOSIS — J069 Acute upper respiratory infection, unspecified: Secondary | ICD-10-CM | POA: Diagnosis not present

## 2018-09-15 MED ORDER — CETIRIZINE HCL 10 MG PO TABS: 10.0000 mg | ORAL_TABLET | Freq: Every day | ORAL | 0 refills | Status: AC

## 2018-09-15 MED ORDER — BENZONATATE 100 MG PO CAPS: 100.0000 mg | ORAL_CAPSULE | Freq: Three times a day (TID) | ORAL | 0 refills | Status: DC

## 2018-09-15 MED ORDER — FLUTICASONE PROPIONATE 50 MCG/ACT NA SUSP: 1.0000 | Freq: Every day | NASAL | 2 refills | Status: AC

## 2018-09-15 MED ORDER — GUAIFENESIN ER 600 MG PO TB12: 600.0000 mg | ORAL_TABLET | Freq: Two times a day (BID) | ORAL | 0 refills | Status: DC

## 2018-09-15 NOTE — ED Triage Notes (Signed)
Per pt she has been having nasal congestion, chest congestion cough and aches and chills. Pt stated started about 2 days ago.

## 2018-09-15 NOTE — ED Provider Notes (Signed)
[facility]    CSN: [id number] Arrival date & time: 09/15/18  0911     History   Chief Complaint Chief Complaint  Patient presents with  . Cough  . Nasal Congestion    HPI Stacy Horton is a 62 y.o. female.    URI  Presenting symptoms: congestion, cough, ear pain and rhinorrhea   Severity:  Moderate Duration:  2 days Timing:  Constant Progression:  Unchanged Chronicity:  New Relieved by:  Nothing Worsened by:  Nothing Ineffective treatments:  OTC medications Risk factors: no immunosuppression, no recent illness, no recent travel and no sick contacts     Past Medical History:  Diagnosis Date  . Coronary artery disease    a. 10/2016: LHC with 95% mLAD s/p DES, mod non obst dz in dLAD and OM1  . GERD (gastroesophageal reflux disease)   . Hemorrhoids   . Hyperlipidemia    a. intolerant to multiple statins and Zetia  . Hypertension   . IBS (irritable bowel syndrome)   . Lower extremity edema   . Obesity   . Refusal of blood transfusions as patient is Jehovah's Witness   . Type II diabetes mellitus [facility])     Patient Active Problem List   Diagnosis Date Noted  . Cervical radiculopathy 09/08/2018  . Grief 01/13/2018  . Coronary artery disease   . Chronic venous insufficiency 07/08/2013  . Hemorrhoids 11/09/2010  . Reflux esophagitis 05/21/2009  . CARPAL TUNNEL SYNDROME, BILATERAL 12/30/2008  . Diabetes mellitus type 2, diet-controlled (HCC) 01/27/2007  . Hypercholesterolemia 01/27/2007  . Essential hypertension 01/27/2007    Past Surgical History:  Procedure Laterality Date  . ABDOMINAL HYSTERECTOMY  1990s  . BREAST EXCISIONAL BIOPSY Left 2008  . CARPAL TUNNEL RELEASE Bilateral 2011-2012  . CORONARY ANGIOPLASTY WITH STENT PLACEMENT  11/08/2016  . CORONARY STENT INTERVENTION N/A 11/08/2016   Procedure: Coronary Stent Intervention;  Surgeon: Yvonne Kendall, MD;  Location: [facility];  Service: Cardiovascular;  Laterality: N/A;  .  LEFT HEART CATH AND CORONARY ANGIOGRAPHY N/A 11/08/2016   Procedure: Left Heart Cath and Coronary Angiography;  Surgeon: Yvonne Kendall, MD;  Location: [facility] INVASIVE CV LAB;  Service: Cardiovascular;  Laterality: N/A;  . NM MYOVIEW LTD  03/15/2008   no ischemia  . US ECHOCARDIOGRAPHY  09/28/2010   normal    OB History   No obstetric history on file.      Home Medications    Prior to Admission medications   Medication Sig Start Date End Date Taking? Authorizing Provider  aspirin 81 MG chewable tablet Chew 1 tablet (81 mg total) by mouth daily. 11/09/16   Janetta Hora, PA-C  benzonatate (TESSALON) 100 MG capsule Take 1 capsule (100 mg total) by mouth every 8 (eight) hours. 09/15/18   Dahlia Byes A, NP  Biotin 5000 MCG TABS Take 1 tablet by mouth daily.    [provider]  carvedilol (COREG) 12.5 MG tablet TAKE 1 TABLET (12.5 MG TOTAL) BY MOUTH 2 (TWO) TIMES DAILY. 04/28/18   Strader, Lennart Pall, PA-C  cetirizine (ZYRTEC) 10 MG tablet Take 1 tablet (10 mg total) by mouth daily. 09/15/18   Dahlia Byes A, NP  Cholecalciferol (VITAMIN D3) 2000 units CHEW Chew 1 each by mouth daily.    [provider]  clopidogrel (PLAVIX) 75 MG tablet TAKE 1 TABLET (75 MG TOTAL) BY MOUTH DAILY WITH BREAKFAST. 03/25/18   Croitoru, Mihai, MD  docusate sodium (COLACE) 100 MG capsule Take 100 mg by  mouth daily.    [provider]  Evolocumab (REPATHA SURECLICK) 140 MG/ML SOAJ Inject 140 mg into the skin every 14 (fourteen) days. 04/09/18   Croitoru, Mihai, MD  ferrous sulfate 325 (65 FE) MG tablet TAKE 1 TABLET BY MOUTH EVERY DAY WITH BREAKFAST 04/09/18   Howard PouchFeng, Lauren, MD  fluticasone [employer](FLONASE) 50 MCG/ACT nasal spray Place 1 spray into both nostrils daily. 09/15/18   Dahlia ByesBast, Khylie Larmore A, NP  guaiFENesin (MUCINEX) 600 MG 12 hr tablet Take 1 tablet (600 mg total) by mouth 2 (two) times daily. 09/15/18   Janace ArisBast, Niylah Hassan A, NP  linaclotide (LINZESS) 145 MCG CAPS capsule Take 145 mcg by mouth daily  before breakfast.    [provider]  lisinopril-hydrochlorothiazide (PRINZIDE,ZESTORETIC) 20-12.5 MG tablet 12/23. TAKE 2 TABLETS BY MOUTH DAILY. 04/06/18   Croitoru, Mihai, MD  meloxicam (MOBIC) 15 MG tablet Take 1 tablet (15 mg total) by mouth daily. 09/08/18   Tillman Sersiccio, Angela C, DO  naproxen (NAPROSYN) 500 MG tablet Take 1 tablet (500 mg total) by mouth 2 (two) times daily with a meal. For 3 days, then use as needed. 01/13/18   Palma HolterGunadasa, Kanishka G, MD  nitroGLYCERIN (NITROSTAT) 0.4 MG SL tablet Place 1 tablet (0.4 mg total) under the tongue every 5 (five) minutes as needed. 09/18/16   Strader, Lennart PallBrittany M, PA-C  polyethylene glycol (MIRALAX / GLYCOLAX) packet Take 17 g by mouth daily.    [provider]  rosuvastatin (CRESTOR) 5 MG tablet TAKE 1 TABLET BY MOUTH EVERY DAY 04/10/18   Croitoru, Mihai, MD  Saccharomyces boulardii (FLORASTOR PO) Take 1 capsule by mouth daily.    [provider]  valACYclovir (VALTREX) 500 MG tablet Take 500 mg by mouth daily. 08/15/16   [provider]    Family History Family History  Problem Relation Age of Onset  . Cancer Father [id number]  . Breast cancer Other   . Congestive Heart Failure Mother   . Heart attack Sister 361    Social History Social History   Tobacco Use  . Smoking status: Never Smoker  . Smokeless tobacco: Never Used  Substance Use Topics  . Alcohol use: Yes    Alcohol/week: 2.0 standard drinks    Types: 2 Shots of liquor per week  . Drug use: No     Allergies   Ezetimibe and Statins   Review of Systems Review of Systems  HENT: Positive for congestion, ear pain and rhinorrhea.   Respiratory: Positive for cough.      Physical Exam Triage Vital Signs ED Triage Vitals  Enc Vitals Group     BP 09/15/18 0931 (!) 164/79     Pulse Rate 09/15/18 0931 81     Resp 09/15/18 0931 18     Temp 09/15/18 0931 98.1 F (36.7 C)     Temp Source 09/15/18 0931 Oral     SpO2 09/15/18 0931 97 %     Weight  09/15/18 0941 172 lb (78 kg)     Height 09/15/18 0941 5\' 5"  (1.651 m)     Head Circumference --      Peak Flow --      Pain Score 09/15/18 0941 6     Pain Loc --      Pain Edu? --      Excl. in GC? --    No data found.  Updated Vital Signs BP (!) 164/79 (BP Location: Left Arm)   Pulse 81   Temp 98.1 F (36.7 C) (Oral)  Resp 18   Ht 5\' 5"  (1.651 m)   Wt 172 lb (78 kg)   SpO2 97%   BMI 28.62 kg/m   Visual Acuity Right Eye Distance:   Left Eye Distance:   Bilateral Distance:    Right Eye Near:   Left Eye Near:    Bilateral Near:     Physical Exam Vitals signs and nursing note reviewed.  Constitutional:      Appearance: Normal appearance. She is not ill-appearing or toxic-appearing.  HENT:     Head: Normocephalic and atraumatic.     Right Ear: Tympanic membrane, ear canal and external ear normal.     Left Ear: Tympanic membrane, ear canal and external ear normal.     Nose: Congestion and rhinorrhea present.  Eyes:     Conjunctiva/sclera: Conjunctivae normal.  Neck:     Musculoskeletal: Normal range of motion.  Cardiovascular:     Rate and Rhythm: Normal rate and regular rhythm.     Heart sounds: Normal heart sounds.  Pulmonary:     Effort: Pulmonary effort is normal.     Breath sounds: Normal breath sounds.  Musculoskeletal: Normal range of motion.  Lymphadenopathy:     Cervical: No cervical adenopathy.  Skin:    General: Skin is warm and dry.  Neurological:     Mental Status: She is alert.  Psychiatric:        Mood and Affect: Mood normal.      UC Treatments / Results  Labs (all labs ordered are listed, but only abnormal results are displayed) Labs Reviewed - No data to display  EKG None  Radiology No results found.  Procedures Procedures (including critical care time)  Medications Ordered in UC Medications - No data to display  Initial Impression / Assessment and Plan / UC Course  I have reviewed the triage vital signs and the nursing  notes.  Pertinent labs & imaging results that were available during my care of the patient were reviewed by me and considered in my medical decision making (see chart for details).    Symptoms consistent with viral upper respiratory infection Symptomatic treatment Mucinex for cough and congestion Tessalon pearls for more severe cough   Final Clinical Impressions(s) / UC Diagnoses   Final diagnoses:  Viral upper respiratory tract infection     Discharge Instructions     I believe this is a viral URI We will treat with mucinex, tessalon pearls for cough.  You could also try some Flonase nasal spray and zyrtec for symptoms.  Follow up as needed for continued or worsening symptoms     ED Prescriptions    Medication Sig Dispense Auth. Provider   fluticasone (FLONASE) 50 MCG/ACT nasal spray Place 1 spray into both nostrils daily. 16 g Sigmund Morera A, NP   cetirizine (ZYRTEC) 10 MG tablet Take 1 tablet (10 mg total) by mouth daily. 30 tablet Standley Bargo A, NP   guaiFENesin (MUCINEX) 600 MG 12 hr tablet Take 1 tablet (600 mg total) by mouth 2 (two) times daily. 15 tablet Payson Crumby A, NP   benzonatate (TESSALON) 100 MG capsule Take 1 capsule (100 mg total) by mouth every 8 (eight) hours. 21 capsule Dahlia Byes A, NP     Controlled Substance Prescriptions [place] Controlled Substance Registry consulted? Not Applicable   Janace Aris, NP 09/15/18 1013

## 2018-09-15 NOTE — Discharge Instructions (Addendum)
I believe this is a viral URI We will treat with mucinex, tessalon pearls for cough.  You could also try some Flonase nasal spray and zyrtec for symptoms.  Follow up as needed for continued or worsening symptoms

## 2018-09-22 ENCOUNTER — Encounter (HOSPITAL_COMMUNITY): Payer: Self-pay | Admitting: Emergency Medicine

## 2018-09-22 ENCOUNTER — Ambulatory Visit (HOSPITAL_COMMUNITY)
Admission: EM | Admit: 2018-09-22 | Discharge: 2018-09-22 | Disposition: A | Discharge status: Home / Self Care | Payer: BLUE CROSS/BLUE SHIELD | Attending: Family Medicine | Admitting: Family Medicine

## 2018-09-22 ENCOUNTER — Other Ambulatory Visit: Payer: Self-pay

## 2018-09-22 DIAGNOSIS — J069 Acute upper respiratory infection, unspecified: Secondary | ICD-10-CM | POA: Diagnosis not present

## 2018-09-22 DIAGNOSIS — J0100 Acute maxillary sinusitis, unspecified: Secondary | ICD-10-CM

## 2018-09-22 DIAGNOSIS — B9789 Other viral agents as the cause of diseases classified elsewhere: Secondary | ICD-10-CM | POA: Diagnosis not present

## 2018-09-22 MED ORDER — AMOXICILLIN-POT CLAVULANATE 875-125 MG PO TABS: 1.0000 | ORAL_TABLET | Freq: Two times a day (BID) | ORAL | 0 refills | Status: DC

## 2018-09-22 MED ORDER — HYDROCODONE-HOMATROPINE 5-1.5 MG/5ML PO SYRP: 5.0000 mL | ORAL_SOLUTION | Freq: Four times a day (QID) | ORAL | 0 refills | Status: DC | PRN

## 2018-09-22 NOTE — ED Triage Notes (Signed)
PT was seen here 1 week ago for congestion. PT reports congestion continues and she has now developed a cough.

## 2018-09-22 NOTE — ED Provider Notes (Signed)
[facility]    CSN: [id number] Arrival date & time: 09/22/18  1155     History   Chief Complaint Chief Complaint  Patient presents with  . Nasal Congestion  . Cough    HPI Stacy Horton is a 62 y.o. female.   Is a 62 year old woman who is here at the urgent care a week ago and treated for upper respiratory infection.  She is back again with the same complaints.  Has developed a severe cough and her sinuses feel totally blocked up.  She is a non-smoker and has no history of asthma.     Past Medical History:  Diagnosis Date  . Coronary artery disease    a. 10/2016: LHC with 95% mLAD s/p DES, mod non obst dz in dLAD and OM1  . GERD (gastroesophageal reflux disease)   . Hemorrhoids   . Hyperlipidemia    a. intolerant to multiple statins and Zetia  . Hypertension   . IBS (irritable bowel syndrome)   . Lower extremity edema   . Obesity   . Refusal of blood transfusions as patient is Jehovah's Witness   . Type II diabetes mellitus [facility])     Patient Active Problem List   Diagnosis Date Noted  . Cervical radiculopathy 09/08/2018  . Grief 01/13/2018  . Coronary artery disease   . Chronic venous insufficiency 07/08/2013  . Hemorrhoids 11/09/2010  . Reflux esophagitis 05/21/2009  . CARPAL TUNNEL SYNDROME, BILATERAL 12/30/2008  . Diabetes mellitus type 2, diet-controlled (HCC) 01/27/2007  . Hypercholesterolemia 01/27/2007  . Essential hypertension 01/27/2007    Past Surgical History:  Procedure Laterality Date  . ABDOMINAL HYSTERECTOMY  1990s  . BREAST EXCISIONAL BIOPSY Left 2008  . CARPAL TUNNEL RELEASE Bilateral 2011-2012  . CORONARY ANGIOPLASTY WITH STENT PLACEMENT  11/08/2016  . CORONARY STENT INTERVENTION N/A 11/08/2016   Procedure: Coronary Stent Intervention;  Surgeon: Yvonne Kendall, MD;  [facility];  Service: Cardiovascular;  Laterality: N/A;  . LEFT HEART CATH AND CORONARY ANGIOGRAPHY N/A 11/08/2016   Procedure: Left Heart  Cath and Coronary Angiography;  Surgeon: Yvonne Kendall, MD;  Location: [facility] INVASIVE CV LAB;  Service: Cardiovascular;  Laterality: N/A;  . NM MYOVIEW LTD  03/15/2008   no ischemia  . US ECHOCARDIOGRAPHY  09/28/2010   normal    OB History   No obstetric history on file.      Home Medications    Prior to Admission medications   Medication Sig Start Date End Date Taking? Authorizing Provider  aspirin 81 MG chewable tablet Chew 1 tablet (81 mg total) by mouth daily. 11/09/16  Yes Janetta Hora, PA-C  benzonatate (TESSALON) 100 MG capsule Take 1 capsule (100 mg total) by mouth every 8 (eight) hours. 09/15/18  Yes Bast, Traci A, NP  carvedilol (COREG) 12.5 MG tablet TAKE 1 TABLET (12.5 MG TOTAL) BY MOUTH 2 (TWO) TIMES DAILY. 04/28/18  Yes Strader, [place] M, PA-C  cetirizine (ZYRTEC) 10 MG tablet Take 1 tablet (10 mg total) by mouth daily. 09/15/18  Yes Bast, Traci A, NP  Cholecalciferol (VITAMIN D3) 2000 units CHEW Chew 1 each by mouth daily.   Yes [provider]  clopidogrel (PLAVIX) 75 MG tablet TAKE 1 TABLET (75 MG TOTAL) BY MOUTH DAILY WITH BREAKFAST. 03/25/18  Yes Croitoru, Mihai, MD  Evolocumab (REPATHA SURECLICK) 140 MG/ML SOAJ Inject 140 mg into the skin every 14 (fourteen) days. 04/09/18  Yes Croitoru, Mihai, MD  ferrous sulfate 325 (65 FE) MG tablet  TAKE 1 TABLET BY MOUTH EVERY DAY WITH BREAKFAST 04/09/18  Yes Howard PouchFeng, Lauren, MD  fluticasone [facility](FLONASE) 50 MCG/ACT nasal spray Place 1 spray into both nostrils daily. 09/15/18  Yes Bast, Traci A, NP  guaiFENesin (MUCINEX) 600 MG 12 hr tablet Take 1 tablet (600 mg total) by mouth 2 (two) times daily. 09/15/18  Yes Bast, Traci A, NP  linaclotide (LINZESS) 145 MCG CAPS capsule Take 145 mcg by mouth daily before breakfast.   Yes [provider]  lisinopril-hydrochlorothiazide (PRINZIDE,ZESTORETIC) 20-12.5 MG tablet 12/23. TAKE 2 TABLETS BY MOUTH DAILY. 04/06/18  Yes Croitoru, Mihai, MD  meloxicam (MOBIC) 15 MG tablet Take 1  tablet (15 mg total) by mouth daily. 09/08/18  Yes Riccio, Marylene LandAngela C, DO  polyethylene glycol (MIRALAX / GLYCOLAX) packet Take 17 g by mouth daily.   Yes [provider]  amoxicillin-clavulanate (AUGMENTIN) 875-125 MG tablet Take 1 tablet by mouth every 12 (twelve) hours. 09/22/18   Elvina SidleLauenstein, Billijo Dilling, MD  Biotin 5000 MCG TABS Take 1 tablet by mouth daily.    [provider]  docusate sodium (COLACE) 100 MG capsule Take 100 mg by mouth daily.    [provider]  HYDROcodone-homatropine (HYDROMET) 5-1.5 MG/5ML syrup Take 5 mLs by mouth every 6 (six) hours as needed for cough. 09/22/18   Elvina SidleLauenstein, Cathlin Buchan, MD  naproxen (NAPROSYN) 500 MG tablet Take 1 tablet (500 mg total) by mouth 2 (two) times daily with a meal. For 3 days, then use as needed. 01/13/18   Palma HolterGunadasa, Kanishka G, MD  nitroGLYCERIN (NITROSTAT) 0.4 MG SL tablet Place 1 tablet (0.4 mg total) under the tongue every 5 (five) minutes as needed. 09/18/16   Strader, Lennart PallBrittany M, PA-C  rosuvastatin (CRESTOR) 5 MG tablet TAKE 1 TABLET BY MOUTH EVERY DAY 04/10/18   Croitoru, Mihai, MD  Saccharomyces boulardii (FLORASTOR PO) Take 1 capsule by mouth daily.    [provider]  valACYclovir (VALTREX) 500 MG tablet Take 500 mg by mouth daily. 08/15/16   [provider]    Family History Family History  Problem Relation Age of Onset  . Cancer Father 6883  . Breast cancer Other   . Congestive Heart Failure Mother   . Heart attack Sister 7861    Social History Social History   Tobacco Use  . Smoking status: Never Smoker  . Smokeless tobacco: Never Used  Substance Use Topics  . Alcohol use: Yes    Alcohol/week: 2.0 standard drinks    Types: 2 Shots of liquor per week  . Drug use: No     Allergies   Ezetimibe and Statins   Review of Systems Review of Systems   Physical Exam Triage Vital Signs ED Triage Vitals  Enc Vitals Group     BP 09/22/18 1214 (!) 136/92     Pulse Rate 09/22/18 1214 86      Resp 09/22/18 1214 16     Temp 09/22/18 1214 98.9 F (37.2 C)     Temp Source 09/22/18 1214 Oral     SpO2 09/22/18 1214 100 %     Weight --      Height --      Head Circumference --      Peak Flow --      Pain Score 09/22/18 1210 5     Pain Loc --      Pain Edu? --      Excl. in GC? --    No data found.  Updated Vital Signs BP (!) 136/92  Pulse 86   Temp 98.9 F (37.2 C) (Oral)   Resp 16   SpO2 100%    Physical Exam Vitals signs and nursing note reviewed.  Constitutional:      Appearance: Normal appearance.  HENT:     Head: Normocephalic.     Right Ear: Tympanic membrane and external ear normal.     Left Ear: Tympanic membrane and external ear normal.     Nose: Congestion present.     Mouth/Throat:     Mouth: Mucous membranes are moist.  Eyes:     Conjunctiva/sclera: Conjunctivae normal.  Neck:     Musculoskeletal: Normal range of motion and neck supple.  Cardiovascular:     Rate and Rhythm: Normal rate and regular rhythm.     Heart sounds: Normal heart sounds.  Pulmonary:     Effort: Pulmonary effort is normal.     Breath sounds: Rhonchi present.  Musculoskeletal: Normal range of motion.  Skin:    General: Skin is warm and dry.  Neurological:     General: No focal deficit present.     Mental Status: She is alert.  Psychiatric:        Mood and Affect: Mood normal.      UC Treatments / Results  Labs (all labs ordered are listed, but only abnormal results are displayed) Labs Reviewed - No data to display  EKG None  Radiology No results found.  Procedures Procedures (including critical care time)  Medications Ordered in UC Medications - No data to display  Initial Impression / Assessment and Plan / UC Course  I have reviewed the triage vital signs and the nursing notes.  Pertinent labs & imaging results that were available during my care of the patient were reviewed by me and considered in my medical decision making (see chart for  details).    Final Clinical Impressions(s) / UC Diagnoses   Final diagnoses:  Acute non-recurrent maxillary sinusitis  Viral URI with cough   Discharge Instructions   None    ED Prescriptions    Medication Sig Dispense Auth. Provider   amoxicillin-clavulanate (AUGMENTIN) 875-125 MG tablet Take 1 tablet by mouth every 12 (twelve) hours. 14 tablet Elvina SidleLauenstein, Leeza Heiner, MD   HYDROcodone-homatropine (HYDROMET) 5-1.5 MG/5ML syrup Take 5 mLs by mouth every 6 (six) hours as needed for cough. 60 mL Elvina SidleLauenstein, Kyannah Climer, MD     Controlled Substance Prescriptions [place] Controlled Substance Registry consulted? Not Applicable   Elvina SidleLauenstein, Shahidah Nesbitt, MD 09/22/18 1250

## 2018-10-02 ENCOUNTER — Encounter: Payer: BLUE CROSS/BLUE SHIELD | Admitting: Student in an Organized Health Care Education/Training Program

## 2018-10-08 ENCOUNTER — Other Ambulatory Visit: Payer: Self-pay | Admitting: Internal Medicine

## 2018-11-01 ENCOUNTER — Other Ambulatory Visit: Payer: Self-pay | Admitting: Internal Medicine

## 2018-11-02 NOTE — Telephone Encounter (Signed)
LVM to call office back to inform pt of below and to schedule her an appointment in [place]. Lamonte Sakai, [place] Arabie D, [place]

## 2018-11-02 NOTE — Telephone Encounter (Signed)
Apt made with Mosetta Putt for 2/25.

## 2018-11-02 NOTE — Telephone Encounter (Signed)
BP med refilled. Patient will be due for a checkup and lab work in 12/2018. [facility] can you please call and schedule an appointment for her?

## 2018-11-24 ENCOUNTER — Ambulatory Visit: Payer: BLUE CROSS/BLUE SHIELD | Admitting: Student in an Organized Health Care Education/Training Program

## 2019-02-24 ENCOUNTER — Telehealth: Payer: Self-pay | Admitting: Cardiovascular Disease

## 2019-02-24 NOTE — Telephone Encounter (Signed)
Stress test where? Not with [place].Marland KitchenMarland Kitchen

## 2019-02-24 NOTE — Telephone Encounter (Signed)
Please let her know I understand and that they should be able to see all of her notes and results through [facility], but she should call us once she establishes follow-up so we can make sure the transition 15 monthsis seamless have been off her from a was with [I was patient.  All the  best!

## 2019-02-24 NOTE — Telephone Encounter (Signed)
[facility]

## 2019-02-24 NOTE — Telephone Encounter (Signed)
Per pt had a stress test and is awaiting results .cy

## 2019-02-24 NOTE — Telephone Encounter (Signed)
New message    *STAT* If patient is at the pharmacy, call can be transferred to refill team.   1. Which medications need to be refilled? (please list name of each medication and dose if known) per patient she needs that needs all medications renewed on her medication list  2. Which pharmacy/location (including street and city if local pharmacy) is medication to be sent to?[id number] - [place], [place] - [address]  3. Do they need a 30 day or 90 day supply? 90

## 2019-02-24 NOTE — Telephone Encounter (Signed)
Follow up   Patient states that her insurance has changed and she has to now see a doctor at [facility].

## 2019-02-24 NOTE — Telephone Encounter (Signed)
Per pt due to  Insurance pt has to see [facility] cardiologist .Will let Dr Royann Shivers know .Zack Seal

## 2019-02-24 NOTE — Telephone Encounter (Signed)
lmtcb - patient dure for 1 year checkup.  Please schedule for June 1, 11 or 12 if possible.. thanks

## 2019-03-01 MED ORDER — CLOPIDOGREL BISULFATE 75 MG PO TABS: 75.0000 mg | ORAL_TABLET | Freq: Every day | ORAL | 0 refills | Status: DC

## 2019-03-01 MED ORDER — CARVEDILOL 12.5 MG PO TABS: 12.5000 mg | ORAL_TABLET | Freq: Two times a day (BID) | ORAL | 0 refills | Status: AC

## 2019-03-01 MED ORDER — LISINOPRIL-HYDROCHLOROTHIAZIDE 20-12.5 MG PO TABS: 2.0000 | ORAL_TABLET | Freq: Every day | ORAL | 0 refills | Status: DC

## 2019-03-01 MED ORDER — ROSUVASTATIN CALCIUM 5 MG PO TABS: 5.0000 mg | ORAL_TABLET | Freq: Every day | ORAL | 0 refills | Status: AC

## 2019-03-01 NOTE — Telephone Encounter (Signed)
Rx has been sent to the pharmacy electronically. ° °

## 2019-03-24 ENCOUNTER — Telehealth: Payer: Self-pay

## 2019-03-24 NOTE — Telephone Encounter (Signed)
Called the pt regarding needing labs for the repatha pa renewal but the pt informed me that they no longer see [facility] due to their insurance so I instructed them to contact their new cardiologist and tell them that a pa is needed and cholesterol labs so that they can get them approved for repatha into the next year

## 2019-05-04 ENCOUNTER — Telehealth: Payer: Self-pay

## 2019-05-04 NOTE — Telephone Encounter (Signed)
Called the pt to let them know that we needed labs in order to complete the pa for thr praluent that she was ok switching to but the pt stated now that she has to switch to [facility] and that we will no longer be following her.

## 2019-05-24 ENCOUNTER — Other Ambulatory Visit: Payer: Self-pay | Admitting: Cardiovascular Disease

## 2019-07-21 ENCOUNTER — Other Ambulatory Visit: Payer: Self-pay

## 2019-07-22 ENCOUNTER — Other Ambulatory Visit: Payer: Self-pay | Admitting: Cardiovascular Disease

## 2020-07-29 ENCOUNTER — Ambulatory Visit: Payer: Self-pay | Attending: Internal Medicine

## 2020-07-29 DIAGNOSIS — Z23 Encounter for immunization: Secondary | ICD-10-CM

## 2020-07-29 NOTE — Progress Notes (Signed)
   Covid-19 Vaccination Clinic  Name:  Stacy Horton    MRN: [id number] DOB: 1955-10-21  07/29/2020  Stacy Horton was observed post Covid-19 immunization for 15 minutes without incident. She was provided with Vaccine Information Sheet and instruction to access the V-Safe system.   Stacy Horton was instructed to call 911 with any severe reactions post vaccine: Marland Kitchen Difficulty breathing  . Swelling of face and throat  . A fast heartbeat  . A bad rash all over body  . Dizziness and weakness

## 2020-09-14 ENCOUNTER — Other Ambulatory Visit: Payer: Self-pay

## 2020-09-14 DIAGNOSIS — Z20822 Contact with and (suspected) exposure to covid-19: Secondary | ICD-10-CM

## 2020-09-16 ENCOUNTER — Telehealth: Payer: Self-pay

## 2020-09-16 LAB — SARS-COV-2, NAA 2 DAY TAT

## 2020-09-16 LAB — NOVEL CORONAVIRUS, NAA: SARS-CoV-2, NAA: NOT DETECTED

## 2020-09-16 NOTE — Telephone Encounter (Signed)

## 2021-04-26 ENCOUNTER — Other Ambulatory Visit: Payer: Self-pay | Admitting: Internal Medicine

## 2021-04-26 DIAGNOSIS — Z1231 Encounter for screening mammogram for malignant neoplasm of breast: Secondary | ICD-10-CM

## 2021-04-28 ENCOUNTER — Ambulatory Visit
Admission: RE | Admit: 2021-04-28 | Discharge: 2021-04-28 | Disposition: A | Discharge status: Home / Self Care | Payer: Medicare (Managed Care) | Source: Ambulatory Visit | Attending: *Deleted | Admitting: *Deleted

## 2021-04-28 ENCOUNTER — Other Ambulatory Visit: Payer: Self-pay

## 2021-04-28 DIAGNOSIS — Z1231 Encounter for screening mammogram for malignant neoplasm of breast: Secondary | ICD-10-CM

## 2021-06-15 ENCOUNTER — Ambulatory Visit: Payer: Medicare (Managed Care)

## 2021-06-17 ENCOUNTER — Ambulatory Visit (HOSPITAL_COMMUNITY)
Admission: EM | Admit: 2021-06-17 | Discharge: 2021-06-17 | Disposition: A | Discharge status: Home / Self Care | Payer: Medicare HMO | Attending: Emergency Medicine | Admitting: Emergency Medicine

## 2021-06-17 ENCOUNTER — Encounter (HOSPITAL_COMMUNITY): Payer: Self-pay

## 2021-06-17 DIAGNOSIS — R001 Bradycardia, unspecified: Secondary | ICD-10-CM

## 2021-06-17 DIAGNOSIS — I10 Essential (primary) hypertension: Secondary | ICD-10-CM

## 2021-06-17 NOTE — ED Provider Notes (Signed)
[facility]    CSN: [id number] Arrival date & time: 06/17/21  1523      History   Chief Complaint Chief Complaint  Patient presents with   Hypertension    HPI Stacy Horton is a 65 y.o. female.   HPI Stacy Horton is a 65 y.o. female presenting to UC with c/o elevated blood pressure for about 1 week with associated intermittent headaches.  Pt states BP has been 175/101 earlier today.  She has been taking her prescribed carvedilol this week but decided to try taking an old medication prescribed a few years ago, Nebivolol but states she stopped refilling because she couldn't afford it.  Denies HA or any other symptoms at this time.  Pain is aching and throbbing on top of head when it occurs, resolves with rest and taking her medication.  Denies chest pain, SOB, dizziness, change in vision. No palpitations.  She is due to f/u with PCP for routine exam next month.  Denies recent change in diet, sleep habits or stress but notes she did check her BP every 2-3 hours the last 2 days.  No recent illness of cough or congestion. No fever, n/v/d.    HR: 55bpm, hx of same in the past per medical records.     Past Medical History:  Diagnosis Date   Coronary artery disease    a. 10/2016: LHC with 95% mLAD s/p DES, mod non obst dz in dLAD and OM1   GERD (gastroesophageal reflux disease)    Hemorrhoids    Hyperlipidemia    a. intolerant to multiple statins and Zetia   Hypertension    IBS (irritable bowel syndrome)    Lower extremity edema    Obesity    Refusal of blood transfusions as patient is Jehovah's Witness    Type II diabetes mellitus (HCC)     Patient Active Problem List   Diagnosis Date Noted   Cervical radiculopathy 09/08/2018   Grief 01/13/2018   Coronary artery disease    Chronic venous insufficiency 07/08/2013   Hemorrhoids 11/09/2010   Reflux esophagitis 05/21/2009   CARPAL TUNNEL SYNDROME, BILATERAL 12/30/2008   Diabetes mellitus type 2,  diet-controlled (HCC) 01/27/2007   Hypercholesterolemia 01/27/2007   Essential hypertension 01/27/2007    Past Surgical History:  Procedure Laterality Date   ABDOMINAL HYSTERECTOMY  1990s   BREAST EXCISIONAL BIOPSY Left 2008   CARPAL TUNNEL RELEASE Bilateral 2011-2012   CORONARY ANGIOPLASTY WITH STENT PLACEMENT  11/08/2016   CORONARY STENT INTERVENTION N/A 11/08/2016   Procedure: Coronary Stent Intervention;  Surgeon: Yvonne Kendall, MD;  [facility];  Service: Cardiovascular;  Laterality: N/A;   LEFT HEART CATH AND CORONARY ANGIOGRAPHY N/A 11/08/2016   Procedure: Left Heart Cath and Coronary Angiography;  Surgeon: Yvonne Kendall, MD;  Location: [facility] INVASIVE CV LAB;  Service: Cardiovascular;  Laterality: N/A;   NM MYOVIEW LTD  03/15/2008   no ischemia   US ECHOCARDIOGRAPHY  09/28/2010   normal    OB History   No obstetric history on file.      Home Medications    Prior to Admission medications   Medication Sig Start Date End Date Taking? Authorizing Provider  amoxicillin-clavulanate (AUGMENTIN) 875-125 MG tablet Take 1 tablet by mouth every 12 (twelve) hours. 09/22/18   Elvina Sidle, MD  aspirin 81 MG chewable tablet Chew 1 tablet (81 mg total) by mouth daily. 11/09/16   Janetta Hora, PA-C  benzonatate (TESSALON) 100 MG capsule Take 1 capsule (  100 mg total) by mouth every 8 (eight) hours. 09/15/18   Dahlia Byes A, NP  Biotin 5000 MCG TABS Take 1 tablet by mouth daily.    [provider]  carvedilol (COREG) 12.5 MG tablet Take 1 tablet (12.5 mg total) by mouth 2 (two) times daily. 03/01/19   Croitoru, Mihai, MD  cetirizine (ZYRTEC) 10 MG tablet Take 1 tablet (10 mg total) by mouth daily. 09/15/18   Dahlia Byes A, NP  Cholecalciferol (VITAMIN D3) 2000 units CHEW Chew 1 each by mouth daily.    [provider]  clopidogrel (PLAVIX) 75 MG tablet Take 1 tablet (75 mg total) by mouth daily with breakfast. OV NEEDED 05/24/19   Croitoru, Mihai, MD   docusate sodium (COLACE) 100 MG capsule Take 100 mg by mouth daily.    [provider]  Evolocumab (REPATHA SURECLICK) 140 MG/ML SOAJ Inject 140 mg into the skin every 14 (fourteen) days. 04/09/18   Croitoru, Mihai, MD  ferrous sulfate 325 (65 FE) MG tablet TAKE 1 TABLET BY MOUTH EVERY DAY WITH BREAKFAST 04/09/18   Howard Pouch, MD  fluticasone [facility]) 50 MCG/ACT nasal spray Place 1 spray into both nostrils daily. 09/15/18   Dahlia Byes A, NP  guaiFENesin (MUCINEX) 600 MG 12 hr tablet Take 1 tablet (600 mg total) by mouth 2 (two) times daily. 09/15/18   Bast, Gloris [place] A, NP  HYDROcodone-homatropine (HYDROMET) 5-1.5 MG/5ML syrup Take 5 mLs by mouth every 6 (six) hours as needed for cough. 09/22/18   Elvina Sidle, MD  linaclotide (LINZESS) 145 MCG CAPS capsule Take 145 mcg by mouth daily before breakfast.    [provider]  lisinopril-hydrochlorothiazide (ZESTORETIC) 20-12.5 MG tablet Take 2 tablets by mouth daily. OV NEEDED 05/24/19   Croitoru, Mihai, MD  meloxicam (MOBIC) 15 MG tablet Take 1 tablet (15 mg total) by mouth daily. 09/08/18   Tillman Sers, DO  naproxen (NAPROSYN) 500 MG tablet Take 1 tablet (500 mg total) by mouth 2 (two) times daily with a meal. For 3 days, then use as needed. 01/13/18   Palma Holter, MD  nitroGLYCERIN (NITROSTAT) 0.4 MG SL tablet Place 1 tablet (0.4 mg total) under the tongue every 5 (five) minutes as needed. 09/18/16   Strader, Lennart Pall, PA-C  polyethylene glycol (MIRALAX / GLYCOLAX) packet Take 17 g by mouth daily.    [provider]  rosuvastatin (CRESTOR) 5 MG tablet Take 1 tablet (5 mg total) by mouth daily. 03/01/19   Croitoru, Mihai, MD  Saccharomyces boulardii (FLORASTOR PO) Take 1 capsule by mouth daily.    [provider]  valACYclovir (VALTREX) 500 MG tablet Take 500 mg by mouth daily. 08/15/16   [provider]    Family History Family History  Problem Relation Age of Onset   Cancer Father 33    Breast cancer Other    Congestive Heart Failure Mother    Heart attack Sister 79    Social History Social History   Tobacco Use   Smoking status: Never   Smokeless tobacco: Never  Vaping Use   Vaping Use: Never used  Substance Use Topics   Alcohol use: Yes    Alcohol/week: 2.0 standard drinks    Types: 2 Shots of liquor per week   Drug use: No     Allergies   Ezetimibe and Statins   Review of Systems Review of Systems  Constitutional:  Negative for chills, fatigue and fever.  Eyes:  Negative for pain and visual disturbance.  Respiratory:  Negative for chest tightness and shortness of breath.   Cardiovascular:  Negative for chest pain, palpitations and leg swelling.  Musculoskeletal:  Negative for neck pain and neck stiffness.  Neurological:  Negative for dizziness, weakness, light-headedness, numbness and headaches.    Physical Exam Triage Vital Signs ED Triage Vitals  Enc Vitals Group     BP 06/17/21 1559 (!) 166/86     Pulse Rate 06/17/21 1559 (!) 55     Resp 06/17/21 1559 17     Temp 06/17/21 1559 98.4 F (36.9 C)     Temp Source 06/17/21 1559 Oral     SpO2 06/17/21 1559 97 %     Weight --      Height --      Head Circumference --      Peak Flow --      Pain Score 06/17/21 1604 5     Pain Loc --      Pain Edu? --      Excl. in GC? --    No data found.  Updated Vital Signs BP (!) 158/96   Pulse (!) 55   Temp 98.4 F (36.9 C) (Oral)   Resp 17   SpO2 97%   Visual Acuity Right Eye Distance:   Left Eye Distance:   Bilateral Distance:    Right Eye Near:   Left Eye Near:    Bilateral Near:     Physical Exam Vitals and nursing note reviewed.  Constitutional:      General: She is not in acute distress.    Appearance: Normal appearance. She is well-developed. She is not ill-appearing, toxic-appearing or diaphoretic.  HENT:     Head: Normocephalic and atraumatic.     Right Ear: Tympanic membrane and ear canal normal.     Left Ear: Tympanic  membrane and ear canal normal.     Nose: Nose normal.     Mouth/Throat:     Mouth: Mucous membranes are moist.     Pharynx: Oropharynx is clear.  Eyes:     Extraocular Movements: Extraocular movements intact.     Conjunctiva/sclera: Conjunctivae normal.     Pupils: Pupils are equal, round, and reactive to light.  Cardiovascular:     Rate and Rhythm: Regular rhythm. Bradycardia present.  Pulmonary:     Effort: Pulmonary effort is normal. No respiratory distress.     Breath sounds: Normal breath sounds. No stridor. No wheezing, rhonchi or rales.  Abdominal:     General: There is no distension.     Palpations: Abdomen is soft.     Tenderness: There is no abdominal tenderness.  Musculoskeletal:        General: Normal range of motion.     Cervical back: Normal range of motion and neck supple. No rigidity or tenderness.  Skin:    General: Skin is warm and dry.  Neurological:     General: No focal deficit present.     Mental Status: She is alert and oriented to person, place, and time.     Cranial Nerves: No cranial nerve deficit.     Sensory: No sensory deficit.     Motor: No weakness.     Coordination: Coordination normal.     Gait: Gait normal.  Psychiatric:        Mood and Affect: Mood normal.        Behavior: Behavior normal.     UC Treatments / Results  Labs (all labs ordered are listed, but only  abnormal results are displayed) Labs Reviewed - No data to display  EKG   Radiology No results found.  Procedures Procedures (including critical care time)  Medications Ordered in UC Medications - No data to display  Initial Impression / Assessment and Plan / UC Course  I have reviewed the triage vital signs and the nursing notes.  Pertinent labs & imaging results that were available during my care of the patient were reviewed by me and considered in my medical decision making (see chart for details).     Reassured pt of reasonable BP at this time for a hx of HTN.   Pt is asymptomatic at this time. Encouraged to only take the most recently prescribed BP medication as prescribed and to call PCP tomorrow for BP monitoring.  Discussed symptoms that warrant emergent care in the ED. Pt verbalized understanding and agreement with tx plan.  AVS given   Final Clinical Impressions(s) / UC Diagnoses   Final diagnoses:  Hypertension, unspecified type  Bradycardia     Discharge Instructions       Please only take the blood pressure medication you were most recently prescribed.  Try not to take your blood pressure over and over again as you can make your blood pressure elevated due to the stress/anticipation on each new read.   Please refer to the Blood Pressure Record sheet in this packet to help.  Call your primary care provider tomorrow to schedule a recheck of your blood pressure and review your home log.  It is recommended you bring your blood pressure machine to your appointment to compare to the machine in the office.  Call 911 or have someone drive you to the emergency department if you develop a severe headache, dizziness, change in vision, dizziness, or new concerning symptoms developing.       ED Prescriptions   None    PDMP not reviewed this encounter.   Lurene Shadow, PA-C 06/17/21 1800

## 2021-06-17 NOTE — ED Triage Notes (Signed)
Pt presents with elevated blood pressure X 1 week with intermittent headache.

## 2021-06-17 NOTE — Discharge Instructions (Signed)
  Please only take the blood pressure medication you were most recently prescribed.  Try not to take your blood pressure over and over again as you can make your blood pressure elevated due to the stress/anticipation on each new read.   Please refer to the Blood Pressure Record sheet in this packet to help.  Call your primary care provider tomorrow to schedule a recheck of your blood pressure and review your home log.  It is recommended you bring your blood pressure machine to your appointment to compare to the machine in the office.  Call 911 or have someone drive you to the emergency department if you develop a severe headache, dizziness, change in vision, dizziness, or new concerning symptoms developing.

## 2021-08-16 ENCOUNTER — Other Ambulatory Visit: Payer: Self-pay | Admitting: Family Medicine

## 2021-08-16 DIAGNOSIS — R109 Unspecified abdominal pain: Secondary | ICD-10-CM

## 2021-08-17 IMAGING — MG MM DIGITAL SCREENING BILAT W/ TOMO AND CAD
8 series · 8 of 24 positions shown · non-contrast
Comparison: Previous exam(s).

CLINICAL DATA: Screening.

EXAM:
DIGITAL SCREENING BILATERAL MAMMOGRAM WITH TOMOSYNTHESIS AND CAD
TECHNIQUE: Bilateral screening digital craniocaudal and mediolateral oblique
mammograms were obtained. Bilateral screening digital breast
tomosynthesis was performed. The images were evaluated with
computer-aided detection.

[R CC synth-2D]
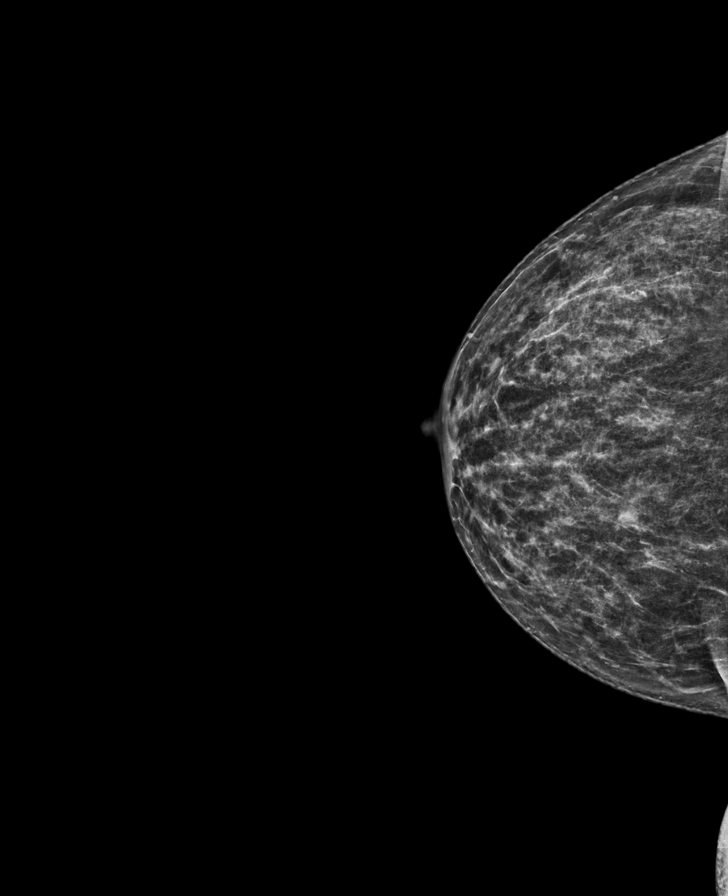

[L CC synth-2D]
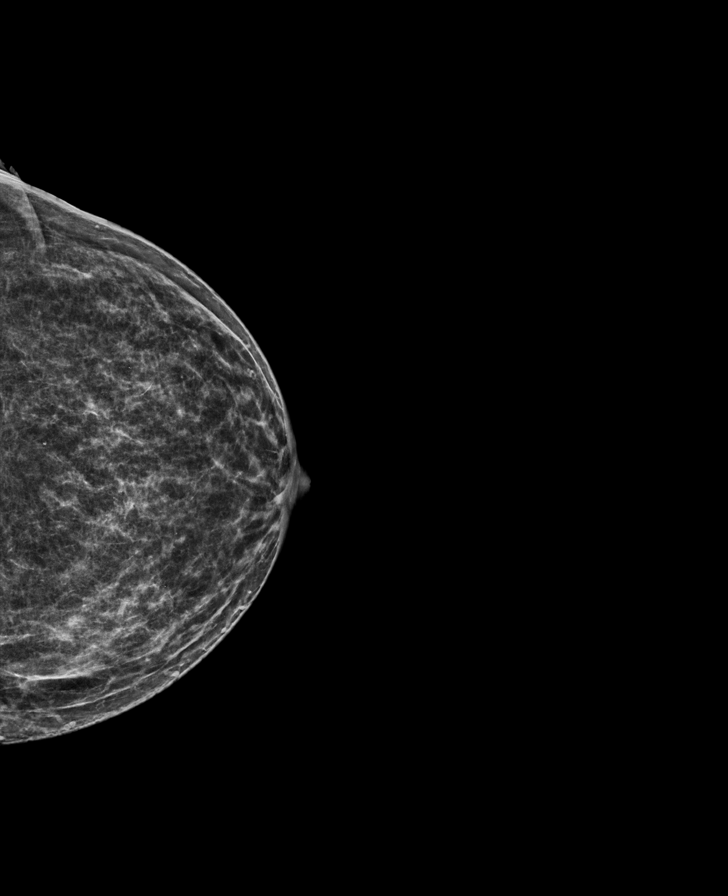

[R MLO synth-2D]
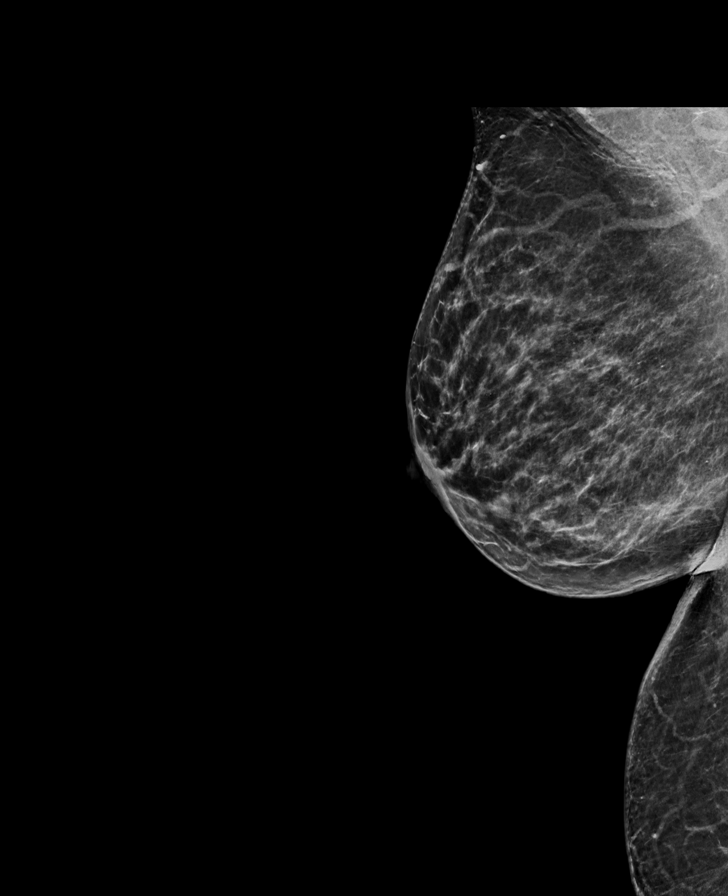

[L MLO synth-2D]
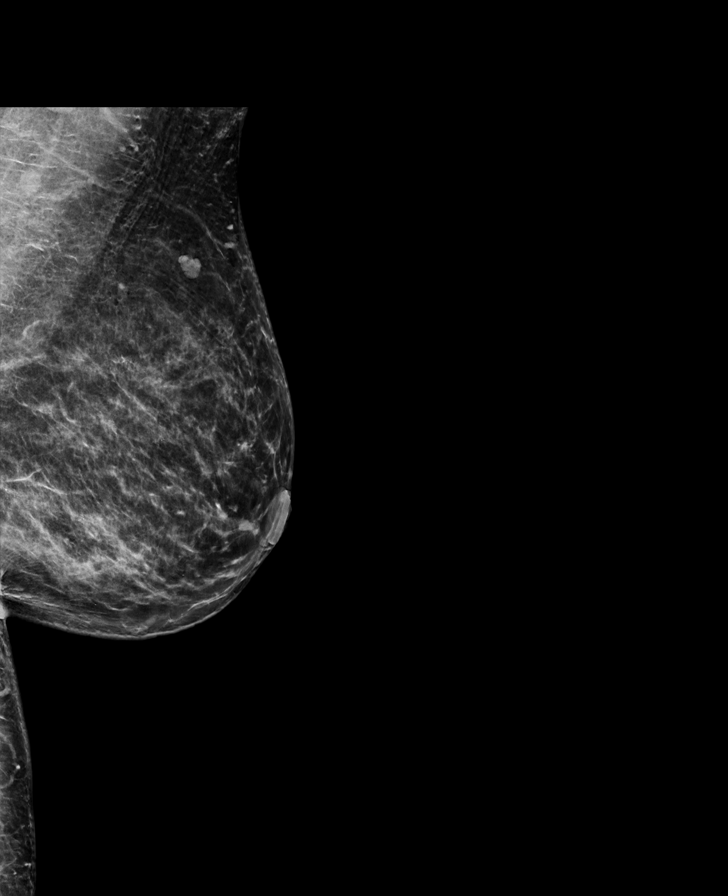

[L CC tomo · tomo slice 29/58.0]
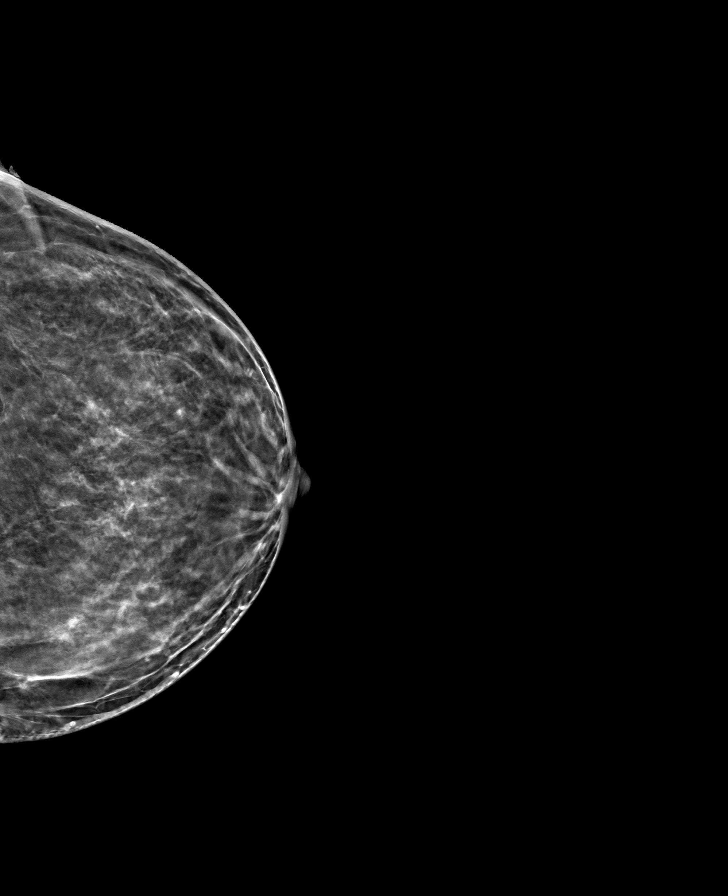

[R CC tomo · tomo slice 31/62.0]
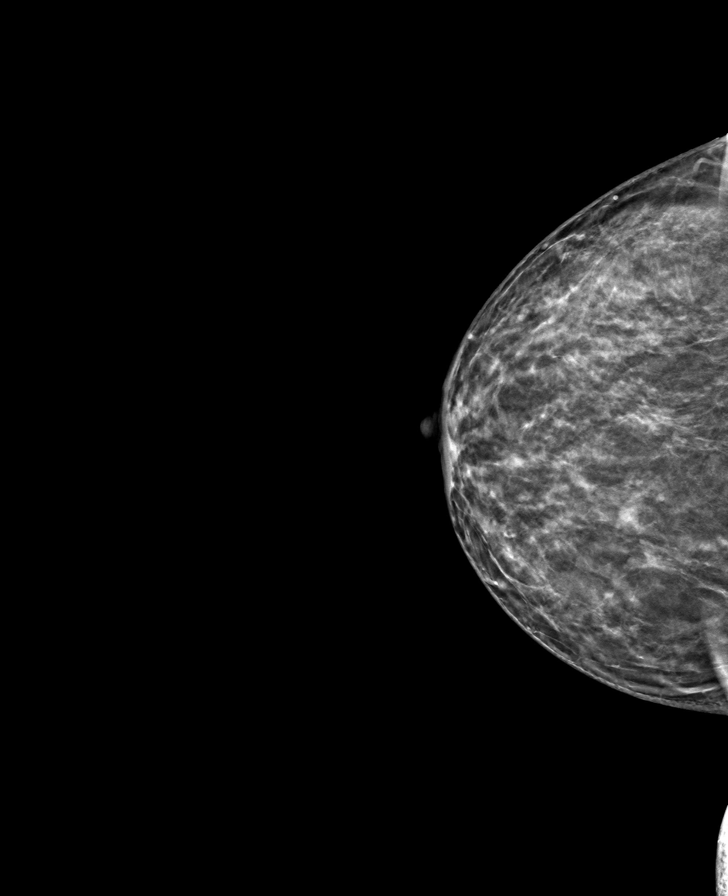

[L MLO tomo · tomo slice 37/73.0]
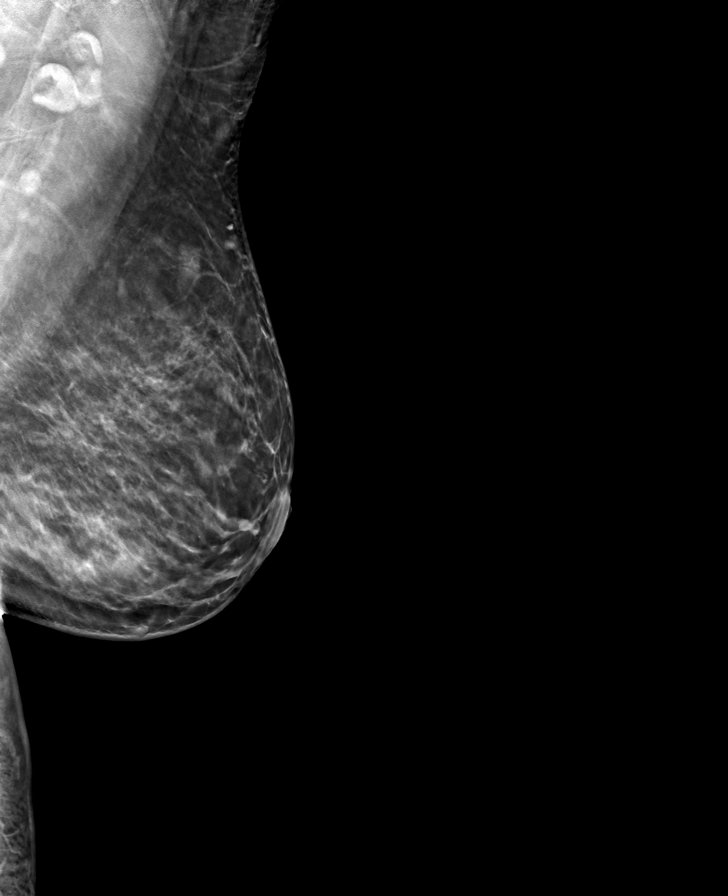

[R MLO tomo · tomo slice 37/73.0]
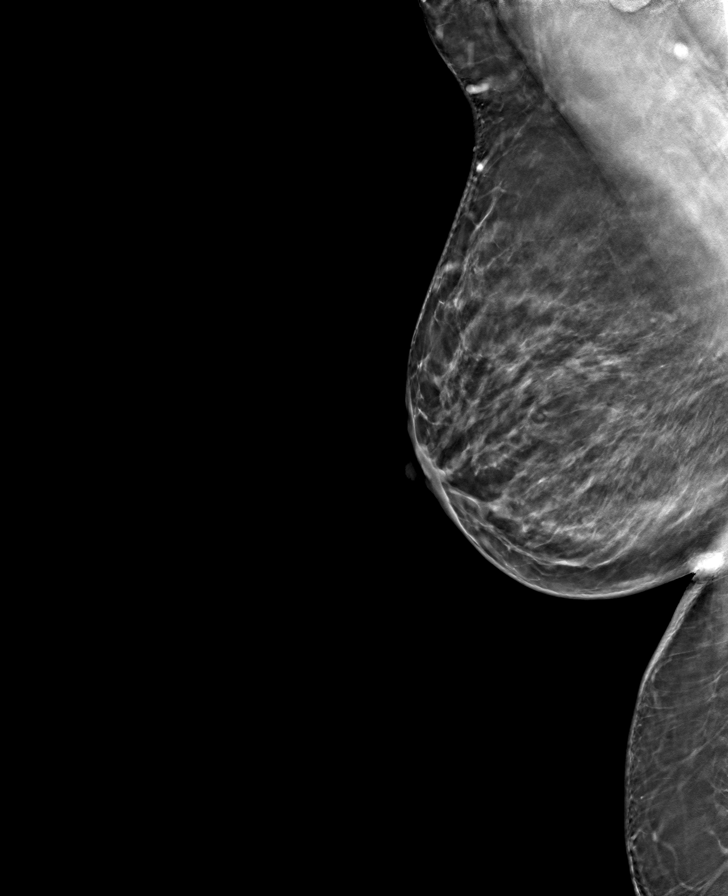

[8 of 24 positions shown; findings below may reference images not displayed]

ACR Breast Density Category c: The breast tissue is heterogeneously
dense, which may obscure small masses.
FINDINGS: There are no findings suspicious for malignancy.
IMPRESSION: No mammographic evidence of malignancy. A result letter of this
screening mammogram will be mailed directly to the patient.

RECOMMENDATION:
Screening mammogram in one year. (Code:Q3-W-BC3)

BI-RADS CATEGORY  1: Negative.

## 2021-09-14 ENCOUNTER — Ambulatory Visit
Admission: RE | Admit: 2021-09-14 | Discharge: 2021-09-14 | Disposition: A | Discharge status: Home / Self Care | Payer: Medicare HMO | Source: Ambulatory Visit | Attending: Family Medicine | Admitting: Family Medicine

## 2021-09-14 ENCOUNTER — Other Ambulatory Visit: Payer: Self-pay

## 2021-09-14 DIAGNOSIS — R109 Unspecified abdominal pain: Secondary | ICD-10-CM

## 2021-09-14 MED ORDER — IOPAMIDOL (ISOVUE-300) INJECTION 61%
100.0000 mL | Freq: Once | INTRAVENOUS | Status: AC | PRN
  Administered 2021-09-14: 100 mL via INTRAVENOUS

## 2021-09-20 ENCOUNTER — Other Ambulatory Visit: Payer: Self-pay | Admitting: Family Medicine

## 2021-09-20 DIAGNOSIS — Q742 Other congenital malformations of lower limb(s), including pelvic girdle: Secondary | ICD-10-CM

## 2021-10-12 ENCOUNTER — Ambulatory Visit
Admission: RE | Admit: 2021-10-12 | Discharge: 2021-10-12 | Disposition: A | Discharge status: Home / Self Care | Payer: Medicare HMO | Source: Ambulatory Visit | Attending: Family Medicine | Admitting: Family Medicine

## 2021-10-12 DIAGNOSIS — Q742 Other congenital malformations of lower limb(s), including pelvic girdle: Secondary | ICD-10-CM

## 2021-10-12 MED ORDER — GADOBENATE DIMEGLUMINE 529 MG/ML IV SOLN
15.0000 mL | Freq: Once | INTRAVENOUS | Status: AC | PRN
  Administered 2021-10-12: 15 mL via INTRAVENOUS

## 2021-10-14 ENCOUNTER — Other Ambulatory Visit: Payer: Medicare HMO

## 2021-12-18 ENCOUNTER — Other Ambulatory Visit: Payer: Self-pay | Admitting: Internal Medicine

## 2021-12-18 ENCOUNTER — Other Ambulatory Visit (HOSPITAL_COMMUNITY): Payer: Self-pay | Admitting: Internal Medicine

## 2021-12-18 DIAGNOSIS — M1990 Unspecified osteoarthritis, unspecified site: Secondary | ICD-10-CM

## 2021-12-18 DIAGNOSIS — M25511 Pain in right shoulder: Secondary | ICD-10-CM

## 2021-12-18 DIAGNOSIS — M545 Low back pain, unspecified: Secondary | ICD-10-CM

## 2021-12-27 ENCOUNTER — Ambulatory Visit (HOSPITAL_COMMUNITY)
Admission: RE | Admit: 2021-12-27 | Discharge: 2021-12-27 | Disposition: A | Discharge status: Home / Self Care | Payer: Medicare HMO | Source: Ambulatory Visit | Attending: Internal Medicine | Admitting: Internal Medicine

## 2021-12-27 DIAGNOSIS — M545 Low back pain, unspecified: Secondary | ICD-10-CM | POA: Diagnosis present

## 2021-12-27 DIAGNOSIS — M1990 Unspecified osteoarthritis, unspecified site: Secondary | ICD-10-CM | POA: Diagnosis present

## 2021-12-27 DIAGNOSIS — M25511 Pain in right shoulder: Secondary | ICD-10-CM | POA: Insufficient documentation

## 2022-01-03 ENCOUNTER — Other Ambulatory Visit (HOSPITAL_COMMUNITY): Payer: Self-pay | Admitting: Internal Medicine

## 2022-01-03 ENCOUNTER — Other Ambulatory Visit: Payer: Self-pay | Admitting: Internal Medicine

## 2022-01-03 DIAGNOSIS — M889 Osteitis deformans of unspecified bone: Secondary | ICD-10-CM

## 2022-01-15 ENCOUNTER — Encounter (HOSPITAL_COMMUNITY): Payer: Self-pay

## 2022-01-15 ENCOUNTER — Encounter (HOSPITAL_COMMUNITY): Payer: Medicare HMO

## 2022-01-15 ENCOUNTER — Encounter (HOSPITAL_COMMUNITY)
Admission: RE | Admit: 2022-01-15 | Discharge: 2022-01-15 | Disposition: A | Discharge status: Home / Self Care | Payer: Medicare HMO | Source: Ambulatory Visit | Attending: Internal Medicine | Admitting: Internal Medicine

## 2022-01-15 DIAGNOSIS — M889 Osteitis deformans of unspecified bone: Secondary | ICD-10-CM | POA: Insufficient documentation

## 2022-01-15 MED ORDER — TECHNETIUM TC 99M MEDRONATE IV KIT
20.0000 | PACK | Freq: Once | INTRAVENOUS | Status: AC | PRN
  Administered 2022-01-15: 20 via INTRAVENOUS

## 2022-01-18 ENCOUNTER — Encounter (HOSPITAL_COMMUNITY)
Admission: RE | Admit: 2022-01-18 | Discharge: 2022-01-18 | Disposition: A | Discharge status: Home / Self Care | Payer: Medicare HMO | Source: Ambulatory Visit | Attending: Internal Medicine | Admitting: Internal Medicine

## 2022-01-18 DIAGNOSIS — M889 Osteitis deformans of unspecified bone: Secondary | ICD-10-CM | POA: Insufficient documentation

## 2022-01-18 MED ORDER — TECHNETIUM TC 99M MEDRONATE IV KIT
20.0000 | PACK | Freq: Once | INTRAVENOUS | Status: AC | PRN
  Administered 2022-01-18: 20 via INTRAVENOUS

## 2022-02-11 ENCOUNTER — Other Ambulatory Visit: Payer: Self-pay | Admitting: Internal Medicine

## 2022-02-11 DIAGNOSIS — Z13820 Encounter for screening for osteoporosis: Secondary | ICD-10-CM

## 2022-03-07 ENCOUNTER — Other Ambulatory Visit: Payer: Medicare HMO

## 2022-03-11 ENCOUNTER — Other Ambulatory Visit: Payer: Self-pay | Admitting: Internal Medicine

## 2022-03-11 DIAGNOSIS — Z13820 Encounter for screening for osteoporosis: Secondary | ICD-10-CM

## 2022-03-21 ENCOUNTER — Other Ambulatory Visit: Payer: Self-pay | Admitting: Family Medicine

## 2022-03-21 DIAGNOSIS — Z1231 Encounter for screening mammogram for malignant neoplasm of breast: Secondary | ICD-10-CM

## 2022-03-28 ENCOUNTER — Ambulatory Visit
Admission: RE | Admit: 2022-03-28 | Discharge: 2022-03-28 | Disposition: A | Discharge status: Home / Self Care | Payer: Medicare HMO | Source: Ambulatory Visit | Attending: Internal Medicine | Admitting: Internal Medicine

## 2022-03-28 DIAGNOSIS — Z13820 Encounter for screening for osteoporosis: Secondary | ICD-10-CM

## 2022-04-29 ENCOUNTER — Ambulatory Visit: Payer: Medicare HMO

## 2022-04-30 ENCOUNTER — Ambulatory Visit
Admission: RE | Admit: 2022-04-30 | Discharge: 2022-04-30 | Disposition: A | Discharge status: Home / Self Care | Payer: Self-pay | Source: Ambulatory Visit | Attending: Family Medicine | Admitting: Family Medicine

## 2022-04-30 DIAGNOSIS — Z1231 Encounter for screening mammogram for malignant neoplasm of breast: Secondary | ICD-10-CM

## 2022-07-05 ENCOUNTER — Ambulatory Visit
Admission: RE | Admit: 2022-07-05 | Discharge: 2022-07-05 | Disposition: A | Discharge status: Home / Self Care | Payer: Medicare PPO | Source: Ambulatory Visit | Attending: Internal Medicine | Admitting: Internal Medicine

## 2022-07-05 ENCOUNTER — Other Ambulatory Visit: Payer: Self-pay | Admitting: Internal Medicine

## 2022-07-05 DIAGNOSIS — M25551 Pain in right hip: Secondary | ICD-10-CM

## 2022-07-05 DIAGNOSIS — M889 Osteitis deformans of unspecified bone: Secondary | ICD-10-CM

## 2023-01-16 ENCOUNTER — Other Ambulatory Visit: Payer: Self-pay | Admitting: Internal Medicine

## 2023-01-16 ENCOUNTER — Ambulatory Visit
Admission: RE | Admit: 2023-01-16 | Discharge: 2023-01-16 | Disposition: A | Discharge status: Home / Self Care | Payer: Medicare HMO | Source: Ambulatory Visit | Attending: Internal Medicine | Admitting: Internal Medicine

## 2023-01-16 DIAGNOSIS — M889 Osteitis deformans of unspecified bone: Secondary | ICD-10-CM

## 2023-04-23 ENCOUNTER — Encounter (HOSPITAL_COMMUNITY): Payer: Self-pay

## 2023-04-23 ENCOUNTER — Ambulatory Visit (HOSPITAL_COMMUNITY)
Admission: EM | Admit: 2023-04-23 | Discharge: 2023-04-23 | Disposition: A | Discharge status: Home / Self Care | Payer: Medicare HMO | Attending: Emergency Medicine | Admitting: Emergency Medicine

## 2023-04-23 DIAGNOSIS — R519 Headache, unspecified: Secondary | ICD-10-CM | POA: Diagnosis present

## 2023-04-23 DIAGNOSIS — R5383 Other fatigue: Secondary | ICD-10-CM | POA: Diagnosis present

## 2023-04-23 DIAGNOSIS — D649 Anemia, unspecified: Secondary | ICD-10-CM | POA: Diagnosis present

## 2023-04-23 DIAGNOSIS — K649 Unspecified hemorrhoids: Secondary | ICD-10-CM | POA: Insufficient documentation

## 2023-04-23 DIAGNOSIS — Z1152 Encounter for screening for COVID-19: Secondary | ICD-10-CM | POA: Diagnosis not present

## 2023-04-23 LAB — COMPREHENSIVE METABOLIC PANEL
ALT: 20 U/L (ref 0–44)
AST: 34 U/L (ref 15–41)
Albumin: 4 g/dL (ref 3.5–5.0)
Alkaline Phosphatase: 41 U/L (ref 38–126)
Anion gap: 15 (ref 5–15)
BUN: 10 mg/dL (ref 8–23)
CO2: 21 mmol/L — ABNORMAL LOW (ref 22–32)
Calcium: 10.1 mg/dL (ref 8.9–10.3)
Chloride: 97 mmol/L — ABNORMAL LOW (ref 98–111)
Creatinine, Ser: 0.85 mg/dL (ref 0.44–1.00)
GFR, Estimated: >60 mL/min (ref >60)
Glucose, Bld: 155 mg/dL — ABNORMAL HIGH (ref 70–99)
Potassium: 5.1 mmol/L (ref 3.5–5.1)
Sodium: 133 mmol/L — ABNORMAL LOW (ref 135–145)
Total Bilirubin: 0.8 mg/dL (ref 0.3–1.2)
Total Protein: 7.7 g/dL (ref 6.5–8.1)

## 2023-04-23 LAB — CBC WITH DIFFERENTIAL/PLATELET
Abs Immature Granulocytes: 0 10*3/uL (ref 0.00–0.07)
Basophils Absolute: 0.1 10*3/uL (ref 0.0–0.1)
Basophils Relative: 2 %
Eosinophils Absolute: 0.1 10*3/uL (ref 0.0–0.5)
Eosinophils Relative: 2 %
HCT: 34.4 % — ABNORMAL LOW (ref 36.0–46.0)
Hemoglobin: 10.3 g/dL — ABNORMAL LOW (ref 12.0–15.0)
Lymphocytes Relative: 44 %
Lymphs Abs: 1.8 10*3/uL (ref 0.7–4.0)
MCH: 23.6 pg — ABNORMAL LOW (ref 26.0–34.0)
MCHC: 29.9 g/dL — ABNORMAL LOW (ref 30.0–36.0)
MCV: 78.7 fL — ABNORMAL LOW (ref 80.0–100.0)
Monocytes Absolute: 0.2 10*3/uL (ref 0.1–1.0)
Monocytes Relative: 5 %
Neutro Abs: 1.9 10*3/uL (ref 1.7–7.7)
Neutrophils Relative %: 47 %
Platelets: 246 10*3/uL (ref 150–400)
RBC: 4.37 MIL/uL (ref 3.87–5.11)
RDW: 24.3 % — ABNORMAL HIGH (ref 11.5–15.5)
WBC: 4.1 10*3/uL (ref 4.0–10.5)
nRBC: 0 % (ref 0.0–0.2)
nRBC: 0 /100 WBC

## 2023-04-23 MED ORDER — ACETAMINOPHEN 325 MG PO TABS
ORAL_TABLET | ORAL | Status: AC
  Filled 2023-04-23: qty 3

## 2023-04-23 MED ORDER — ACETAMINOPHEN 325 MG PO TABS
975.0000 mg | ORAL_TABLET | Freq: Once | ORAL | Status: AC
  Administered 2023-04-23: 975 mg via ORAL

## 2023-04-23 NOTE — ED Triage Notes (Signed)
Pt states had blood work done 2 wks ago and her hgb was low. States taking iron BID. States told to stop her ibuprofen, meloxicam, and ASA. States having headaches and fatigue since Sunday. Taking tylenol with little relief.

## 2023-04-23 NOTE — Discharge Instructions (Signed)
We have rechecked your hemoglobin and other basic labs today, we will contact you if there is anything emergent with these labs.  We have swabbed you for COVID-19 and our staff will contact you if this is positive, will most likely result tomorrow.  Please continue to take Tylenol for your headache, fiber for constipation, and you were over-the-counter management for hemorrhoids.  Seek immediate care if you develop loss of consciousness, abdominal pain, vomiting, or any changes in your condition.

## 2023-04-23 NOTE — ED Provider Notes (Signed)
[facility]    CSN: [id number] Arrival date & time: 04/23/23  5621      History   Chief Complaint Chief Complaint  Patient presents with   Headache   Fatigue    HPI Stacy Horton is a 67 y.o. female.   Patient presents to clinic for headache, fatigue, cough, body aches and generally feeling unwell.  Reports she had lab work done 2 weeks ago and her hemoglobin was 8.4.  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  Reports her last hemoglobin was around 10, and she is concerned for this drop.  Her primary care provider did put her back on iron, since restarting the iron she has had dark stools.  She has been taking fiber so she is not constipated.  She does have hemorrhoids that cause bright red bleeding, uses topical Preparation H and suppositories to manage these.  She is supposed to get some upper GI study soon to evaluate the drop in hemoglobin, is concerned because she has not yet scheduled these.  She denies any recent sick contacts or fevers.  She denies current abdominal pain, nausea or vomiting.  She has been taking Tylenol for a headache, was advised to stop all NSAIDs. Denies syncope or dizziness with position changes.    The history is provided by the patient and medical records.  Headache Associated symptoms: fatigue   Associated symptoms: no abdominal pain, no fever, no nausea and no vomiting     Past Medical History:  Diagnosis Date   Coronary artery disease    a. 10/2016: LHC with 95% mLAD s/p DES, mod non obst dz in dLAD and OM1   GERD (gastroesophageal reflux disease)    Hemorrhoids    Hyperlipidemia    a. intolerant to multiple statins and Zetia   Hypertension    IBS (irritable bowel syndrome)    Lower extremity edema    Obesity    Refusal of blood transfusions as patient is Jehov[employer]'s Witness    Type II diabetes mellitus (HCC)     Patient Active Problem List   Diagnosis Date Noted   Cervical radiculopathy 09/08/2018    Grief 01/13/2018   Coronary artery disease    Chronic venous insufficiency 07/08/2013   Hemorrhoids 11/09/2010   Reflux esophagitis 05/21/2009   CARPAL TUNNEL SYNDROME, BILATERAL 12/30/2008   Diabetes mellitus type 2, diet-controlled (HCC) 01/27/2007   Hypercholesterolemia 01/27/2007   Essential hypertension 01/27/2007    Past Surgical History:  Procedure Laterality Date   ABDOMINAL HYSTERECTOMY  1990s   BREAST EXCISIONAL BIOPSY Left 2008   CARPAL TUNNEL RELEASE Bilateral 2011-2012   CORONARY ANGIOPLASTY WITH STENT PLACEMENT  11/08/2016   CORONARY STENT INTERVENTION N/A 11/08/2016   Procedure: Coronary Stent Intervention;  Surgeon: Yvonne Kendall, MD;  Location: [facility];  Service: Cardiovascular;  Laterality: N/A;   LEFT HEART CATH AND CORONARY ANGIOGRAPHY N/A 11/08/2016   Procedure: Left Heart Cath and Coronary Angiography;  Surgeon: Yvonne Kendall, MD;  [facility]: [facility];  Service: Cardiovascular;  Laterality: N/A;   NM MYOVIEW LTD  03/15/2008   no ischemia   US ECHOCARDIOGRAPHY  09/28/2010   normal    OB History   No obstetric history on file.      Home Medications    Prior to Admission medications   Medication Sig Start Date End Date Taking? Authorizing Provider  aspirin 81 MG chewable tablet Chew 1 tablet (81 mg total) by mouth daily. Patient not taking:  Reported on 04/23/2023 11/09/16   Janetta Hora, PA-C  Biotin 5000 MCG TABS Take 1 tablet by mouth daily.    [provider]  carvedilol (COREG) 12.5 MG tablet Take 1 tablet (12.5 mg total) by mouth 2 (two) times daily. 03/01/19   Croitoru, Mihai, MD  cetirizine (ZYRTEC) 10 MG tablet Take 1 tablet (10 mg total) by mouth daily. 09/15/18   Dahlia Byes A, NP  Cholecalciferol (VITAMIN D3) 2000 units CHEW Chew 1 each by mouth daily.    [provider]  docusate sodium (COLACE) 100 MG capsule Take 100 mg by mouth daily.    [provider]  Evolocumab (REPATHA SURECLICK) 140  MG/ML SOAJ Inject 140 mg into the skin every 14 (fourteen) days. 04/09/18   Croitoru, Mihai, MD  ferrous sulfate 325 (65 FE) MG tablet TAKE 1 TABLET BY MOUTH EVERY DAY WITH BREAKFAST 04/09/18   Howard Pouch, MD  fluticasone [facility]) 50 MCG/ACT nasal spray Place 1 spray into both nostrils daily. 09/15/18   Janace Aris, NP  linaclotide (LINZESS) 145 MCG CAPS capsule Take 145 mcg by mouth daily before breakfast.    [provider]  lisinopril-hydrochlorothiazide (ZESTORETIC) 20-12.5 MG tablet Take 2 tablets by mouth daily. OV NEEDED 05/24/19   Croitoru, Mihai, MD  meloxicam (MOBIC) 15 MG tablet Take 1 tablet (15 mg total) by mouth daily. Patient not taking: Reported on 04/23/2023 09/08/18   Tillman Sers, DO  naproxen (NAPROSYN) 500 MG tablet Take 1 tablet (500 mg total) by mouth 2 (two) times daily with a meal. For 3 days, then use as needed. 01/13/18   Palma Holter, MD  nitroGLYCERIN (NITROSTAT) 0.4 MG SL tablet Place 1 tablet (0.4 mg total) under the tongue every 5 (five) minutes as needed. 09/18/16   Strader, Lennart Pall, PA-C  polyethylene glycol (MIRALAX / GLYCOLAX) packet Take 17 g by mouth daily.    [provider]  rosuvastatin (CRESTOR) 5 MG tablet Take 1 tablet (5 mg total) by mouth daily. 03/01/19   Croitoru, Mihai, MD  Saccharomyces boulardii (FLORASTOR PO) Take 1 capsule by mouth daily.    [provider]  valACYclovir (VALTREX) 500 MG tablet Take 500 mg by mouth daily. 08/15/16   [provider]    Family History Family History  Problem Relation Age of Onset   Cancer Father 21   Breast cancer Other    Congestive Heart Failure Mother    Heart attack Sister 77    Social History Social History   Tobacco Use   Smoking status: Never   Smokeless tobacco: Never  Vaping Use   Vaping status: Never Used  Substance Use Topics   Alcohol use: Yes    Alcohol/week: 2.0 standard drinks of alcohol    Types: 2 Shots of liquor per week   Drug  use: No     Allergies   Ezetimibe and Statins   Review of Systems Review of Systems  Constitutional:  Positive for fatigue. Negative for fever.  Gastrointestinal:  Positive for anal bleeding, blood in stool and constipation. Negative for abdominal pain, nausea and vomiting.  Neurological:  Positive for headaches.     Physical Exam Triage Vital Signs ED Triage Vitals [04/23/23 0918]  Encounter Vitals Group     BP 121/73     Systolic BP Percentile      Diastolic BP Percentile      Pulse Rate 81     Resp      Temp 98.2 F (  36.8 C)     Temp Source Oral     SpO2 97 %     Weight      Height      Head Circumference      Peak Flow      Pain Score 4     Pain Loc      Pain Education      Exclude from Growth Chart    No data found.  Updated Vital Signs BP 121/73 (BP Location: Left Arm)   Pulse 81   Temp 98.2 F (36.8 C) (Oral)   SpO2 97%   Visual Acuity Right Eye Distance:   Left Eye Distance:   Bilateral Distance:    Right Eye Near:   Left Eye Near:    Bilateral Near:     Physical Exam Vitals and nursing note reviewed.  Constitutional:      Appearance: Normal appearance. She is well-developed.  HENT:     Head: Normocephalic and atraumatic.     Right Ear: External ear normal.     Left Ear: External ear normal.     Nose: Nose normal.     Mouth/Throat:     Mouth: Mucous membranes are moist.  Eyes:     General: No scleral icterus. Cardiovascular:     Rate and Rhythm: Normal rate and regular rhythm.     Heart sounds: Normal heart sounds. No murmur heard. Pulmonary:     Effort: Pulmonary effort is normal. No respiratory distress.     Breath sounds: Normal breath sounds.  Abdominal:     Palpations: Abdomen is soft.  Musculoskeletal:        General: Normal range of motion.  Skin:    General: Skin is warm and dry.  Neurological:     General: No focal deficit present.     Mental Status: She is alert and oriented to person, place, and time.     GCS: GCS  eye subscore is 4. GCS verbal subscore is 5. GCS motor subscore is 6.  Psychiatric:        Mood and Affect: Mood normal.      UC Treatments / Results  Labs (all labs ordered are listed, but only abnormal results are displayed) Labs Reviewed  SARS CORONAVIRUS 2 (TAT 6-24 HRS)  CBC WITH DIFFERENTIAL/PLATELET  COMPREHENSIVE METABOLIC PANEL    EKG   Radiology No results found.  Procedures Procedures (including critical care time)  Medications Ordered in UC Medications  acetaminophen (TYLENOL) tablet 975 mg (has no administration in time range)    Initial Impression / Assessment and Plan / UC Course  I have reviewed the triage vital signs and the nursing notes.  Pertinent labs & imaging results that were available during my care of the patient were reviewed by me and considered in my medical decision making (see chart for details).  Vitals and triage reviewed, patient is hemodynamically stable.  Abdomen is soft and nontender with active bowel sounds.  Hemoglobin recently was 8.4, will recheck today due to fatigue.  Patient has been having headache and body aches, will also check COVID-19.  Does not appear to be having acute bleeding, or acute abdomen needing emergent imaging.  Will contact patient if hemoglobin is lower, to advise emergency follow-up.  Plan of care, follow-up care and emergency precautions given, no questions at this time.    Final Clinical Impressions(s) / UC Diagnoses   Final diagnoses:  Intractable headache, unspecified chronicity pattern, unspecified headache type  Other fatigue  Low hemoglobin     Discharge Instructions      We have rechecked your hemoglobin and other basic labs today, we will contact you if there is anything emergent with these labs.  We have swabbed you for COVID-19 and our staff will contact you if this is positive, will most likely result tomorrow.  Please continue to take Tylenol for your headache, fiber for constipation, and  you were over-the-counter management for hemorrhoids.  Seek immediate care if you develop loss of consciousness, abdominal pain, vomiting, or any changes in your condition.       ED Prescriptions   None    PDMP not reviewed this encounter.   Rinaldo Ratel [place], [place] 04/23/23 [phone]

## 2023-04-24 LAB — SARS CORONAVIRUS 2 (TAT 6-24 HRS): SARS Coronavirus 2: NEGATIVE

## 2023-04-30 ENCOUNTER — Other Ambulatory Visit: Payer: Self-pay | Admitting: Geriatric Medicine

## 2023-04-30 DIAGNOSIS — Z1231 Encounter for screening mammogram for malignant neoplasm of breast: Secondary | ICD-10-CM

## 2023-05-15 ENCOUNTER — Ambulatory Visit
Admission: RE | Admit: 2023-05-15 | Discharge: 2023-05-15 | Disposition: A | Discharge status: Home / Self Care | Payer: Medicare HMO | Source: Ambulatory Visit | Attending: Geriatric Medicine | Admitting: Geriatric Medicine

## 2023-05-15 DIAGNOSIS — Z1231 Encounter for screening mammogram for malignant neoplasm of breast: Secondary | ICD-10-CM

## 2024-04-08 ENCOUNTER — Other Ambulatory Visit: Payer: Self-pay | Admitting: Nurse Practitioner

## 2024-04-08 DIAGNOSIS — Z1231 Encounter for screening mammogram for malignant neoplasm of breast: Secondary | ICD-10-CM

## 2024-05-17 ENCOUNTER — Ambulatory Visit

## 2024-05-20 ENCOUNTER — Ambulatory Visit

## 2024-07-27 ENCOUNTER — Ambulatory Visit

## 2024-08-05 ENCOUNTER — Ambulatory Visit
Admission: RE | Admit: 2024-08-05 | Discharge: 2024-08-05 | Disposition: A | Discharge status: Home / Self Care | Source: Ambulatory Visit | Attending: Nurse Practitioner | Admitting: Nurse Practitioner

## 2024-08-05 DIAGNOSIS — Z1231 Encounter for screening mammogram for malignant neoplasm of breast: Secondary | ICD-10-CM

## 2024-08-19 ENCOUNTER — Ambulatory Visit

## 2024-11-05 ENCOUNTER — Encounter: Payer: Self-pay | Admitting: *Deleted

## 2024-11-05 LAB — AMB RESULTS CONSOLE CBG: Glucose: 137

## 2024-11-05 LAB — POCT ABI - SCREENING FOR PILOT NO CHARGE
Left ABI: 1.1
Right ABI: 1

## 2024-11-05 NOTE — Progress Notes (Unsigned)
 This pt attended 11/06/2023 screening event and BP was 125/76, blood glucose was 137. Pt did indicate food SDOH needs at this time.   At event pt did not indicate having a PCP and did not note any insurance. Pt also indicated that she is not a smoker.
